# Patient Record
Sex: Female | Born: 1965 | Race: Black or African American | Hispanic: No | Marital: Married | Smoking: Never smoker
Health system: Southern US, Community
[De-identification: ages and names within clinical notes are randomized; demographics above are authoritative.]

## PROBLEM LIST (undated history)

## (undated) DIAGNOSIS — N12 Tubulo-interstitial nephritis, not specified as acute or chronic: Secondary | ICD-10-CM

## (undated) DIAGNOSIS — E119 Type 2 diabetes mellitus without complications: Secondary | ICD-10-CM

## (undated) DIAGNOSIS — H409 Unspecified glaucoma: Secondary | ICD-10-CM

## (undated) DIAGNOSIS — N261 Atrophy of kidney (terminal): Secondary | ICD-10-CM

## (undated) DIAGNOSIS — E042 Nontoxic multinodular goiter: Secondary | ICD-10-CM

## (undated) DIAGNOSIS — R87619 Unspecified abnormal cytological findings in specimens from cervix uteri: Secondary | ICD-10-CM

## (undated) DIAGNOSIS — IMO0002 Reserved for concepts with insufficient information to code with codable children: Secondary | ICD-10-CM

## (undated) DIAGNOSIS — E039 Hypothyroidism, unspecified: Secondary | ICD-10-CM

## (undated) DIAGNOSIS — N189 Chronic kidney disease, unspecified: Secondary | ICD-10-CM

## (undated) DIAGNOSIS — Z8619 Personal history of other infectious and parasitic diseases: Secondary | ICD-10-CM

## (undated) HISTORY — DX: Unspecified glaucoma: H40.9

## (undated) HISTORY — DX: Tubulo-interstitial nephritis, not specified as acute or chronic: N12

## (undated) HISTORY — DX: Hypothyroidism, unspecified: E03.9

## (undated) HISTORY — DX: Nontoxic multinodular goiter: E04.2

## (undated) HISTORY — DX: Reserved for concepts with insufficient information to code with codable children: IMO0002

## (undated) HISTORY — DX: Personal history of other infectious and parasitic diseases: Z86.19

## (undated) HISTORY — DX: Chronic kidney disease, unspecified: N18.9

## (undated) HISTORY — DX: Unspecified abnormal cytological findings in specimens from cervix uteri: R87.619

## (undated) HISTORY — DX: Type 2 diabetes mellitus without complications: E11.9

## (undated) HISTORY — DX: Atrophy of kidney (terminal): N26.1

---

## 1991-03-25 DIAGNOSIS — N12 Tubulo-interstitial nephritis, not specified as acute or chronic: Secondary | ICD-10-CM

## 1991-03-25 DIAGNOSIS — E039 Hypothyroidism, unspecified: Secondary | ICD-10-CM

## 1991-03-25 HISTORY — DX: Hypothyroidism, unspecified: E03.9

## 1991-03-25 HISTORY — DX: Tubulo-interstitial nephritis, not specified as acute or chronic: N12

## 2005-10-08 ENCOUNTER — Encounter: Admission: RE | Admit: 2005-10-08 | Discharge: 2005-10-08 | Payer: Self-pay | Admitting: Internal Medicine

## 2005-11-10 ENCOUNTER — Encounter: Admission: RE | Admit: 2005-11-10 | Discharge: 2005-11-10 | Payer: Self-pay | Admitting: Internal Medicine

## 2006-03-23 ENCOUNTER — Encounter: Admission: RE | Admit: 2006-03-23 | Discharge: 2006-03-23 | Payer: Self-pay | Admitting: Internal Medicine

## 2007-06-24 ENCOUNTER — Emergency Department (HOSPITAL_COMMUNITY): Admission: EM | Admit: 2007-06-24 | Discharge: 2007-06-25 | Payer: Self-pay | Admitting: Emergency Medicine

## 2010-04-14 ENCOUNTER — Encounter: Payer: Self-pay | Admitting: Internal Medicine

## 2010-08-06 NOTE — Consult Note (Signed)
Gail Barr, PRELL NO.:  [id number]   MEDICAL RECORD NO.:  [id number]          PATIENT TYPE:  EMS   LOCATION:  MAJO                         FACILITY:  [facility]   PHYSICIAN:  Coletta Memos, M.D.     DATE OF BIRTH:  04-23-1965   DATE OF CONSULTATION:  06/25/2007  DATE OF DISCHARGE:                                 CONSULTATION   TIME OF CONSULTATION:  0230 hours.   INDICATIONS:  Traumatic lumbar puncture.  Gail Barr is a 45 year old  woman who has had a history of headaches now for the past 3 months.  She  says she has not had any kind of history prior to that.  Over the last  few months, the headaches were severe.  She went to eye doctor and  received a new prescription, and said that her eyesight improved for  approximately a week.  Got worse again, and she started to have more  headaches.  Over the last 3 days she has had increasingly severe  headaches.  She says that they felt like the worst headaches that she  has had.  She felt somewhat faint and therefore called the husband, and  he brought her to the [facility] emergency room.  She arrived here 12:31  on June 24, 2007.  She had a subsequent workup.  She had a head CT which  was normal.  Secondary to a history of headaches, a lumbar puncture was  done.  According to the patient, she overheard the radiologist say, oh  I think I hit a vein, and he tried a number of times to perform the  lumbar puncture.  Then he finally remarked that he did not think he had  any blood.  I was contacted by phone and told that there were 8000 cells  in tube 1 and 8000 cells roughly in tube 4.  It was felt that this might  represent a subarachnoid hemorrhage.  I did not believe that it did, as  there were far too few cell for that to represent a subarachnoid  hemorrhage.  There was also absolutely no xanthochromia in the study.  Since this is not the first day that she has had a headache, it would be  extraordinarily unlikely for  her to have had a subarachnoid hemorrhage  without the xanthochromia, nor does the history fit for it.   PAST MEDICAL HISTORY:  Significant for hypothyroidism.  Gail Barr also  stated that, at some point, she saw, I believe, an ophthalmologist  though she did not say whether she saw an ophthalmologist or  optometrist, who felt at one time she may have glaucoma.  These  headaches that she has had have been described as being on the left side  of her face.  She has had blurred vision.  This has also been associated  with nausea and vomiting.  She has had no meningismus but states that  when she turns her head certain ways, she has had neck pain.  The right  side feels better.  She has had no problems with her vision on  the right  side.  She said the vision had been perfect up until the time she got  her eye glasses prescribed approximately a month ago, and since then,  eyesight has changed according to what she reported, drastically in the  left side.   CURRENT MEDICATIONS:  Synthroid 0.125 mg.   She has allergies to IODINE and she says to ADVIL.   She is married, lives at home and does have children.  She does not use  IV drugs.  She does not smoke.  She does not use alcohol.   FAMILY HISTORY:  Consistent with cancer and diabetes.   Her laboratory values were otherwise normal.  White blood cell count of  8.9, sodium and potassium 141 and 4.1.  Erythrocyte sedimentation rate  is 40.  She did have evidence of UTI with 21-50 white blood cells per  high-powered field and many bacteria on her study.  She did have a  pregnancy test which was negative, and the count on the tubes was 8825  red blood cells in tube 1 with 2 white blood cells, and 8155 in tube 4  with 4 white blood cells.  It was described as hazy and pink in both.  Supernatant was colorless in both.   No opening pressure was recorded.   EXAMINATION:  Blood pressure 109/75, pulse 67, temperature 97.  Oxygenation is 97% on  air.  Blood pressure at its highest was 133/84.  She is alert, oriented x4 and answering all questions appropriately.  Speech is clear and fluent.  Memory length, extent, and fund of  knowledge normal.  She has full visual fields.  Pupils equal round  reactive light.  Full extraocular movements.  Funduscopic exam shows  sharp optic disks.  Venous pulsations present bilaterally.  She has  symmetric facial movements and sensation.  Hearing intact to finger rub.  Uvula elevates in midline.  Tongue protrusion midline.  Shoulder shrug  is normal.  She has no drift on exam.  5/5 strength in the upper and  lower extremities.  Intact proprioception intact light touch.  Muscle  tone bulk and coordination are normal.  I did not have her walk, and she  is lying down on the stretcher in a dark room.   Given the patient's history of a 52-month history of headaches, long  history of headaches.  No obvious sentinel hemorrhage, nothing that  connects to a subarachnoid hemorrhage outside of her report that this  was a very severe headache today, also the results of the LP are not  impressive.  It is not at all clear, nor to me is it at all indicative  of, subarachnoid hemorrhage.  She has a negative CT, again no  xanthochromia, and these headaches have been going on for the last few  days.  So, she indeed had a sentinel hemorrhage and has subsequently had  another hemorrhage today, certainly xanthochromia should have been  present.  Xanthochromia was not present on her LP.  I think the patient  should be monitored.  I do think the patient should be assessed for  possible glaucoma.  The history of heavy headaches and blurred vision  just in 1 eye without there being a mass lesion present on the CT, again  is not suggestive of aneurysmal bleed.  She has reactive pupils.  The ER  physicians have heard my recommendations.  If they have more questions,  they can certainly contact me.  ______________________________  Coletta Memos, M.D.     KC/MEDQ  D:  06/25/2007  T:  06/25/2007  Job:  [id number]

## 2010-12-17 LAB — DIFFERENTIAL
Band Neutrophils: 0
Basophils Absolute: 0.1
Basophils Relative: 1
Blasts: 0
Eosinophils Absolute: 0.2
Eosinophils Relative: 2
Lymphocytes Relative: 23
Lymphs Abs: 2
Metamyelocytes Relative: 0
Monocytes Absolute: 0.6
Monocytes Relative: 7
Myelocytes: 0
Neutro Abs: 6
Neutrophils Relative %: 67
Promyelocytes Absolute: 0
nRBC: 0

## 2010-12-17 LAB — CSF CELL COUNT WITH DIFFERENTIAL
RBC Count, CSF: 8155 — ABNORMAL HIGH
RBC Count, CSF: 8825 — ABNORMAL HIGH
WBC, CSF: 2
WBC, CSF: 4

## 2010-12-17 LAB — URINALYSIS, ROUTINE W REFLEX MICROSCOPIC
Bilirubin Urine: NEGATIVE
Glucose, UA: NEGATIVE
Ketones, ur: NEGATIVE
Nitrite: NEGATIVE
Protein, ur: 30 — AB
Specific Gravity, Urine: 1.016
Urobilinogen, UA: 0.2
pH: 6

## 2010-12-17 LAB — CBC
HCT: 32.8 — ABNORMAL LOW
Hemoglobin: 10.7 — ABNORMAL LOW
MCHC: 32.7
MCV: 72.2 — ABNORMAL LOW
Platelets: 488 — ABNORMAL HIGH
RBC: 4.55
RDW: 20 — ABNORMAL HIGH
WBC: 8.9

## 2010-12-17 LAB — CSF CULTURE W GRAM STAIN

## 2010-12-17 LAB — URINE MICROSCOPIC-ADD ON

## 2010-12-17 LAB — POCT I-STAT, CHEM 8
Creatinine, Ser: 1.4 — ABNORMAL HIGH
Glucose, Bld: 114 — ABNORMAL HIGH
Hemoglobin: 11.9 — ABNORMAL LOW
Potassium: 4.1
TCO2: 27

## 2010-12-17 LAB — PREGNANCY, URINE: Preg Test, Ur: NEGATIVE

## 2010-12-17 LAB — SEDIMENTATION RATE: Sed Rate: 40 — ABNORMAL HIGH

## 2010-12-17 LAB — PROTEIN AND GLUCOSE, CSF: Total  Protein, CSF: 77 — ABNORMAL HIGH

## 2011-07-07 ENCOUNTER — Other Ambulatory Visit: Payer: Self-pay | Admitting: Family Medicine

## 2011-07-07 ENCOUNTER — Other Ambulatory Visit (HOSPITAL_COMMUNITY)
Admission: RE | Admit: 2011-07-07 | Discharge: 2011-07-07 | Disposition: A | Discharge status: Home / Self Care | Payer: 59 | Source: Ambulatory Visit | Attending: Family Medicine | Admitting: Family Medicine

## 2011-07-07 ENCOUNTER — Encounter: Payer: Self-pay | Admitting: Family Medicine

## 2011-07-07 ENCOUNTER — Ambulatory Visit (INDEPENDENT_AMBULATORY_CARE_PROVIDER_SITE_OTHER): Payer: 59 | Admitting: Family Medicine

## 2011-07-07 VITALS — BP 122/82 | HR 72 | Ht 68.0 in | Wt 289.0 lb

## 2011-07-07 DIAGNOSIS — Z0000 Encounter for general adult medical examination without abnormal findings: Secondary | ICD-10-CM

## 2011-07-07 DIAGNOSIS — R209 Unspecified disturbances of skin sensation: Secondary | ICD-10-CM

## 2011-07-07 DIAGNOSIS — R202 Paresthesia of skin: Secondary | ICD-10-CM

## 2011-07-07 DIAGNOSIS — R5381 Other malaise: Secondary | ICD-10-CM

## 2011-07-07 DIAGNOSIS — R319 Hematuria, unspecified: Secondary | ICD-10-CM

## 2011-07-07 DIAGNOSIS — Z01419 Encounter for gynecological examination (general) (routine) without abnormal findings: Secondary | ICD-10-CM | POA: Insufficient documentation

## 2011-07-07 DIAGNOSIS — R5383 Other fatigue: Secondary | ICD-10-CM

## 2011-07-07 DIAGNOSIS — E039 Hypothyroidism, unspecified: Secondary | ICD-10-CM | POA: Insufficient documentation

## 2011-07-07 LAB — POCT URINALYSIS DIPSTICK
Bilirubin, UA: NEGATIVE
Glucose, UA: NEGATIVE
Ketones, UA: NEGATIVE
Nitrite, UA: NEGATIVE
Protein, UA: NEGATIVE
Urobilinogen, UA: NEGATIVE

## 2011-07-07 NOTE — Patient Instructions (Addendum)
HEALTH MAINTENANCE RECOMMENDATIONS:  It is recommended that you get at least 30 minutes of aerobic exercise at least 5 days/week (for weight loss, you may need as much as 60-90 minutes). This can be any activity that gets your heart rate up. This can be divided in 10-15 minute intervals if needed, but try and build up your endurance at least once a week.  Weight bearing exercise is also recommended twice weekly.  Eat a healthy diet with lots of vegetables, fruits and fiber.  "Colorful" foods have a lot of vitamins (ie green vegetables, tomatoes, red peppers, etc).  Limit sweet tea, regular sodas and alcoholic beverages, all of which has a lot of calories and sugar.  Up to 1 alcoholic drink daily may be beneficial for women (unless trying to lose weight, watch sugars).  Drink a lot of water.  Calcium recommendations are 1200-1500 mg daily (1500 mg for postmenopausal women or women without ovaries), and vitamin D 1000 IU daily.  This should be obtained from diet and/or supplements (vitamins), and calcium should not be taken all at once, but in divided doses.  Monthly self breast exams and yearly mammograms for women over the age of 28 is recommended.  Sunscreen of at least SPF 30 should be used on all sun-exposed parts of the skin when outside between the hours of 10 am and 4 pm (not just when at beach or pool, but even with exercise, golf, tennis, and yard work!)  Use a sunscreen that says "broad spectrum" so it covers both UVA and UVB rays, and make sure to reapply every 1-2 hours.  Remember to change the batteries in your smoke detectors when changing your clock times in the spring and fall.  Use your seat belt every time you are in a car, and please drive safely and not be distracted with cell phones and texting while driving.  Encouraged either prenatal vitamins (for folic acid, in case of pregnancy) vs use of contraception, ie condoms, vs husband vasectomy vs IUD vs other.  Please schedule  mammogram (I gave your [facility])

## 2011-07-07 NOTE — Progress Notes (Signed)
Gail Barr is a 46 y.o. female who presents for a complete physical. She is a new patient, and no prior records are available.  She has the following concerns:   Hypothyroidism--she has been off her thyroid medication for 3 years.  Has gained some weight (although lost 5 pounds recently).  She has gained 80 pounds since moving to [place] in 2005.  She thinks she had more hair loss and memory problems while taking thyroid meds since being off of them. Does admit to feeling more fatigued.  Tired in the morning.  Husband says she doesn't snore. Gets some dry patches of skin.  Some constipation, but relieved with proper diet (ie prune juice).  Period are irregular--states they have been irregular ever since having her kids.    Health Maintenance: There is no immunization history on file for this patient. Recalls last tetanus was approx 2006. Doesn't get flu shots Last Pap smear: 3 years ago Last mammogram: never Last colonoscopy: never Last DEXA: never Exercise: 3 times/week, mostly stretching. Dentist: once yearly Ophtho: last week, wears glasses  She is not using any contraception.  Didn't tolerate OCP's (even progesterone-only ones) due to nausea.    Past Medical History  Diagnosis Date  . Hypothyroidism 1993  . 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 Multiple thyroid nodules   . Abnormal Pap smear early 2000's    had colposcopy, no additional treatment. normal paps since    History reviewed. No pertinent past surgical history.  History   Social History  . Marital Status: Married    Spouse Name: N/A    Number of Children: 3  . Years of Education: N/A   Occupational History  . 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 Smoking status: Never Smoker   . Smokeless tobacco: Never Used  . Alcohol Use: Yes     one drink every three months.  . Drug Use: No  . Sexually Active: Yes -- Female partner(s)      Birth Control/ Protection: None   Other Topics Concern  . Not on file   Social History Narrative   Lives with husband, and 13 yo daughter.  Son is a police office in [place], has 1 grandchild.  Daughter is a Theatre stage manager in [place].  She moved here in 2005 from [place]    Family History  Problem Relation Age of Onset  . Asthma Brother   . Diabetes Maternal Aunt   . Diabetes Maternal Uncle   . Diabetes Maternal Grandmother   . Cancer Maternal Grandfather     prostate cancer  . Asthma Brother     No current outpatient prescriptions on file.  Allergies  Allergen Reactions  . Iohexol      Desc: Pt states [place] 5 [place]rs [place] and experienced severe sob and swelling. We did her scan w/o iv contrast today.        Thanks., Onset Date: [id number]    ROS:  The patient denies anorexia, fever, vision changes, decreased hearing, ear pain, sore throat, chest pain, palpitations, dizziness, syncope, dyspnea on exertion, cough, swelling, nausea, vomiting, diarrhea, melena, hematochezia, indigestion/heartburn, hematuria, incontinence, vaginal discharge, odor or itch, genital lesions, joint pains,  weakness, tremor, suspicious skin lesions, depression, anxiety, abnormal bleeding/bruising, or enlarged lymph nodes. Weight changes--see HPI.  Headaches only if she doesn't wear glasses.  +irregular menstrual cycles.  Sometimes  gets some numbness in her toes, and her inner calf on the left.  Symptoms x 6 months, and sometimes gets on her L inner arm also. She occasionally has some slight discomfort after voiding.  Admits to holding her urine for a long time, and then sometimes it is painful when she voids. Had a small painful breast lump a few months ago, that completely resolved.    PHYSICAL EXAM: BP 122/82  Pulse 72  Ht 5\' 8"  (1.727 m)  Wt 289 lb (131.09 kg)  BMI 43.94 kg/m2  LMP 06/23/2011  General Appearance:    Alert, cooperative, no distress, appears stated age  Head:     Normocephalic, without obvious abnormality, atraumatic  Eyes:    PERRL, conjunctiva/corneas clear, EOM's intact, fundi    benign  Ears:    Normal TM's and external ear canals  Nose:   Nares normal, mucosa normal, no drainage or sinus   tenderness  Throat:   Lips, mucosa, and tongue normal; teeth and gums normal  Neck:   Supple, no lymphadenopathy;  thyroid: moderately enlarged, appears fairly symmetric with no dominant nodules.  no carotid   bruit or JVD  Back:    Spine nontender, no curvature, ROM normal, no CVA     tenderness  Lungs:     Clear to auscultation bilaterally without wheezes, rales or     ronchi; respirations unlabored  Chest Wall:    No deformity. Tenderness at lower sternum/xiphoid process.   Heart:    Regular rate and rhythm, S1 and S2 normal, no murmur, rub   or gallop  Breast Exam:    No tenderness, masses, or nipple discharge or inversion.      No axillary lymphadenopathy  Abdomen:     Soft, nondistended, normoactive bowel sounds,    no masses, no hepatosplenomegaly. No epigastric tenderness. Mild suprapubic tenderness  Genitalia:    Normal external genitalia without lesions.  BUS and vagina normal; cervix without lesions, or cervical motion tenderness. Red and brown blood in vaginal vault.  Uterus and adnexa not enlarged, nontender, no masses.  Pap performed  Rectal:    Normal tone, no masses or tenderness; guaiac negative stool  Extremities:   No clubbing, cyanosis or edema  Pulses:   2+ and symmetric all extremities  Skin:   Skin color, texture, turgor normal, no rashes or lesions  Lymph nodes:   Cervical, supraclavicular, and axillary nodes normal  Neurologic:   CNII-XII intact, normal strength, sensation and gait; reflexes 0-1+ and symmetric throughout          Psych:   Normal mood, affect, hygiene and grooming.       ASSESSMENT/PLAN: 1. Routine general medical examination at a health care facility  POCT Urinalysis Dipstick, Visual acuity screening, Lipid panel,  Cytology - PAP  2. Hematuria  Urine culture   likely related to ongoing menstrual bleeding, but some suprapubic tenderness.  Urine sent for culture.  3. Obesity, Class III, BMI 40-49.9 (morbid obesity)  Hemoglobin A1c  4. Paresthesias  Hemoglobin A1c  5. Other malaise and fatigue  CBC with Differential, Comprehensive metabolic panel, TSH, Vitamin D 25 hydroxy  6. Unspecified hypothyroidism  TSH   has been off of thyroid medication for 3 years.    Discussed monthly self breast exams and yearly mammograms after the age of 17; at least 30 minutes of aerobic activity at least 5 days/week; proper sunscreen use reviewed; healthy diet, including goals of calcium and vitamin D intake and alcohol recommendations (  less than or equal to 1 drink/day) reviewed; regular seatbelt use; changing batteries in smoke detectors.  Immunization recommendations discussed.  Colonoscopy recommendations reviewed--recommended at age 45  Encouraged either prenatal vitamins (for folic acid, in case of pregnancy) vs use of contraception, ie condoms, vs husband vasectomy vs IUD vs other.  Need to check previous records for date and type of tetanus given (if not given TdaP, then recommend booster)

## 2011-07-08 ENCOUNTER — Encounter: Payer: Self-pay | Admitting: Family Medicine

## 2011-07-08 DIAGNOSIS — E559 Vitamin D deficiency, unspecified: Secondary | ICD-10-CM | POA: Insufficient documentation

## 2011-07-08 LAB — CBC WITH DIFFERENTIAL/PLATELET
Eosinophils Absolute: 0.2 10*3/uL (ref 0.0–0.7)
Eosinophils Relative: 2 % (ref 0–5)
Lymphocytes Relative: 29 % (ref 12–46)
MCHC: 31 g/dL (ref 30.0–36.0)
Monocytes Relative: 5 % (ref 3–12)
Neutro Abs: 6.1 10*3/uL (ref 1.7–7.7)
Platelets: 507 10*3/uL — ABNORMAL HIGH (ref 150–400)
RDW: 19.5 % — ABNORMAL HIGH (ref 11.5–15.5)
WBC: 9.6 10*3/uL (ref 4.0–10.5)

## 2011-07-08 LAB — LIPID PANEL
LDL Cholesterol: 117 mg/dL — ABNORMAL HIGH (ref 0–99)
Total CHOL/HDL Ratio: 4.8 Ratio

## 2011-07-08 LAB — COMPREHENSIVE METABOLIC PANEL
AST: 13 U/L (ref 0–37)
Alkaline Phosphatase: 62 U/L (ref 39–117)
BUN: 15 mg/dL (ref 6–23)
Creat: 1.17 mg/dL — ABNORMAL HIGH (ref 0.50–1.10)
Glucose, Bld: 74 mg/dL (ref 70–99)

## 2011-07-08 LAB — VITAMIN D 25 HYDROXY (VIT D DEFICIENCY, FRACTURES): Vit D, 25-Hydroxy: 16 ng/mL — ABNORMAL LOW (ref 30–89)

## 2011-07-08 LAB — TSH: TSH: 49.634 u[IU]/mL — ABNORMAL HIGH (ref 0.350–4.500)

## 2011-07-09 ENCOUNTER — Other Ambulatory Visit: Payer: Self-pay | Admitting: *Deleted

## 2011-07-09 DIAGNOSIS — D649 Anemia, unspecified: Secondary | ICD-10-CM

## 2011-07-09 DIAGNOSIS — E039 Hypothyroidism, unspecified: Secondary | ICD-10-CM

## 2011-07-09 MED ORDER — LEVOTHYROXINE SODIUM 150 MCG PO TABS: 150.0000 ug | ORAL_TABLET | Freq: Every day | ORAL | Status: DC

## 2011-07-09 MED ORDER — ERGOCALCIFEROL 1.25 MG (50000 UT) PO CAPS: 50000.0000 [IU] | ORAL_CAPSULE | ORAL | Status: DC

## 2011-07-10 ENCOUNTER — Other Ambulatory Visit: Payer: Self-pay | Admitting: *Deleted

## 2011-07-10 LAB — URINE CULTURE: Colony Count: >=100000

## 2011-07-10 MED ORDER — NITROFURANTOIN MONOHYD MACRO 100 MG PO CAPS: 100.0000 mg | ORAL_CAPSULE | Freq: Two times a day (BID) | ORAL | Status: DC

## 2011-08-25 ENCOUNTER — Encounter: Payer: Self-pay | Admitting: Family Medicine

## 2011-08-25 DIAGNOSIS — D509 Iron deficiency anemia, unspecified: Secondary | ICD-10-CM | POA: Insufficient documentation

## 2013-01-06 ENCOUNTER — Telehealth: Payer: Self-pay | Admitting: *Deleted

## 2013-01-06 ENCOUNTER — Encounter (HOSPITAL_COMMUNITY): Payer: Self-pay | Admitting: Emergency Medicine

## 2013-01-06 ENCOUNTER — Emergency Department (HOSPITAL_COMMUNITY)
Admission: EM | Admit: 2013-01-06 | Discharge: 2013-01-06 | Disposition: A | Discharge status: Home / Self Care | Payer: 59 | Attending: Emergency Medicine | Admitting: Emergency Medicine

## 2013-01-06 ENCOUNTER — Emergency Department (HOSPITAL_COMMUNITY): Payer: 59

## 2013-01-06 DIAGNOSIS — R0789 Other chest pain: Secondary | ICD-10-CM | POA: Insufficient documentation

## 2013-01-06 DIAGNOSIS — Z862 Personal history of diseases of the blood and blood-forming organs and certain disorders involving the immune mechanism: Secondary | ICD-10-CM | POA: Insufficient documentation

## 2013-01-06 DIAGNOSIS — R42 Dizziness and giddiness: Secondary | ICD-10-CM | POA: Insufficient documentation

## 2013-01-06 DIAGNOSIS — R209 Unspecified disturbances of skin sensation: Secondary | ICD-10-CM | POA: Insufficient documentation

## 2013-01-06 DIAGNOSIS — Z8639 Personal history of other endocrine, nutritional and metabolic disease: Secondary | ICD-10-CM | POA: Insufficient documentation

## 2013-01-06 DIAGNOSIS — Z8739 Personal history of other diseases of the musculoskeletal system and connective tissue: Secondary | ICD-10-CM | POA: Insufficient documentation

## 2013-01-06 LAB — COMPREHENSIVE METABOLIC PANEL
Alkaline Phosphatase: 65 U/L (ref 39–117)
BUN: 9 mg/dL (ref 6–23)
Calcium: 8.9 mg/dL (ref 8.4–10.5)
Chloride: 103 mEq/L (ref 96–112)
Creatinine, Ser: 0.87 mg/dL (ref 0.50–1.10)
GFR calc Af Amer: >90 mL/min (ref >90)
Glucose, Bld: 135 mg/dL — ABNORMAL HIGH (ref 70–99)
Total Protein: 7.6 g/dL (ref 6.0–8.3)

## 2013-01-06 LAB — D-DIMER, QUANTITATIVE: D-Dimer, Quant: 0.27 ug/mL-FEU (ref 0.00–0.48)

## 2013-01-06 LAB — CBC
HCT: 26.4 % — ABNORMAL LOW (ref 36.0–46.0)
Hemoglobin: 8.5 g/dL — ABNORMAL LOW (ref 12.0–15.0)
MCH: 21.3 pg — ABNORMAL LOW (ref 26.0–34.0)
MCHC: 32.2 g/dL (ref 30.0–36.0)
MCV: 66.2 fL — ABNORMAL LOW (ref 78.0–100.0)
Platelets: 376 10*3/uL (ref 150–400)
RBC: 3.99 MIL/uL (ref 3.87–5.11)
RDW: 19.4 % — ABNORMAL HIGH (ref 11.5–15.5)
WBC: 9.8 10*3/uL (ref 4.0–10.5)

## 2013-01-06 LAB — POCT I-STAT TROPONIN I
Troponin i, poc: 0 ng/mL (ref 0.00–0.08)
Troponin i, poc: 0 ng/mL (ref 0.00–0.08)

## 2013-01-06 MED ORDER — GI COCKTAIL ~~LOC~~
30.0000 mL | Freq: Once | ORAL | Status: AC
  Administered 2013-01-06: 30 mL via ORAL
  Filled 2013-01-06: qty 30

## 2013-01-06 MED ORDER — ASPIRIN 81 MG PO CHEW
324.0000 mg | CHEWABLE_TABLET | Freq: Once | ORAL | Status: AC
  Administered 2013-01-06: 324 mg via ORAL
  Filled 2013-01-06: qty 4

## 2013-01-06 MED ORDER — MORPHINE SULFATE 4 MG/ML IJ SOLN
4.0000 mg | Freq: Once | INTRAMUSCULAR | Status: AC
  Administered 2013-01-06: 4 mg via INTRAVENOUS
  Filled 2013-01-06: qty 1

## 2013-01-06 MED ORDER — NITROGLYCERIN 0.4 MG SL SUBL
0.4000 mg | SUBLINGUAL_TABLET | SUBLINGUAL | Status: DC | PRN
  Administered 2013-01-06: 0.4 mg via SUBLINGUAL

## 2013-01-06 NOTE — ED Provider Notes (Signed)
TIME SEEN: 3:04 AM  CHIEF COMPLAINT: Chest pain  HPI: Patient is a 47 year old female with a history of hypothyroidism who presents the emergency department with a burning like chest pain in the left side of her chest that started at 10 PM yesterday. She states she's also had numbness along her left cheek and left leg and in her left inner, upper arm and left fingertips. She states she's also had lightheadedness. She states that she feels like she has to take big deep breaths to be what to catch her breath. Denies any nausea, vomiting. No diaphoresis. She's never had similar symptoms. She describes the burning as moderate in nature. No radiation. No prior history of cardiac disease, stress test or cardiac catheterization. No prior history of PE or DVT. No recent prolonged immobilization, hospitalization, surgery, fracture, trauma, tobacco use or exogenous hormone use. Denies a history of hypertension, diabetes, hyperlipidemia. No family history of CAD.  ROS: See HPI Constitutional: no fever  Eyes: no drainage  ENT: no runny nose   Cardiovascular:   chest pain  Resp: no SOB  GI: no vomiting GU: no dysuria Integumentary: no rash  Allergy: no hives  Musculoskeletal: no leg swelling  Neurological: no slurred speech ROS otherwise negative  PAST MEDICAL HISTORY/PAST SURGICAL HISTORY:  Past Medical History  Diagnosis Date  . Hypothyroidism 1993  . 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 Multiple thyroid nodules   . Abnormal Pap smear early 2000's    had colposcopy, no additional treatment. normal paps since    MEDICATIONS:  Prior to Admission medications   Not on File    ALLERGIES:  Allergies  Allergen Reactions  . Iohexol      Desc: Pt states she [place] a [place] approx 5 yrs ago and experienced severe sob and swelling. We did her scan w/o iv contrast today.        Thanks., Onset Date: [id number]      SOCIAL HISTORY:  History  Substance Use Topics  . Smoking status: Never Smoker   . Smokeless tobacco: Never Used  . Alcohol Use: Yes     Comment: one drink every three months.    FAMILY HISTORY: Family History  Problem Relation Age of Onset  . Asthma Brother   . Diabetes Maternal Aunt   . Diabetes Maternal Uncle   . Diabetes Maternal Grandmother   . Cancer Maternal Grandfather     prostate cancer  . Asthma Brother     EXAM: BP 135/72  Pulse 92  Temp(Src) 98.5 F (36.9 C) (Oral)  Resp 15  SpO2 100%  LMP 01/06/2013 CONSTITUTIONAL: Alert and oriented and responds appropriately to questions. Well-appearing; well-nourished HEAD: Normocephalic EYES: Conjunctivae clear, PERRL ENT: normal nose; no rhinorrhea; moist mucous membranes; pharynx without lesions noted NECK: Supple, no meningismus, no LAD  CARD: RRR; S1 and S2 appreciated; no murmurs, no clicks, no rubs, no gallops RESP: Normal chest excursion without splinting or tachypnea; breath sounds clear and equal bilaterally; no wheezes, no rhonchi, no rales,  ABD/GI: Normal bowel sounds; non-distended; soft, non-tender, no rebound, no guarding BACK:  The back appears normal and is non-tender to palpation, there is no CVA tenderness EXT: Normal ROM in all joints; non-tender to palpation; no edema; normal capillary refill; no cyanosis    SKIN: Normal color for age and race; warm NEURO: Moves all extremities equally PSYCH: The patient's mood and manner are appropriate. Grooming  and personal hygiene are appropriate.  MEDICAL DECISION MAKING: Patient with atypical chest pain with minimal risk factors for ACS. Her heart score is 1 based on a slightly abnormal EKG. PERC negative.  Labs including troponin negative other than a slightly decreased hemoglobin at 8.5.  Patient reports heavy menstrual bleeding for the past 3 months. She has outpatient OB/GYN followup for this.  As for pt's chest pain, will obtain chest x-ray and give GI  cocktail and reassess. Patient is hemodynamically stable. I do not feel she needs admission at this time.   Date: 01/06/2013 2:41 AM  Rate: 88  Rhythm: normal sinus rhythm  QRS Axis: normal  Intervals: Right bundle branch block  ST/T Wave abnormalities: normal  Conduction Disutrbances: none  Narrative Interpretation: Right bundle branch block, no old for comparison     ED PROGRESS: No relief with GI cocktail. Patient is now mildly tachycardic, crying. She states her pain is now a tightness, sharp and burning. Will give aspirin, nitroglycerin and repeat EKG. She is otherwise hemodynamically stable.    Date: 01/06/2013 5:27 AM  Rate: 91  Rhythm: normal sinus rhythm  QRS Axis: normal  Intervals: Right bundle branch block  ST/T Wave abnormalities: normal  Conduction Disutrbances: none  Narrative Interpretation: Right bundle branch block, no new ST changes    5:59 AM  Pt reports some very mild relief with nitroglycerin. Will give morphine and reassess. D-dimer negative. Repeat troponin that is greater than 6 hours after the onset of symptoms pending.  6:22 AM  Second troponin is negative. Had lengthy discussion with patient and husband at bedside who agree that they would like outpatient followup. Given strict return precautions. Instructed him to followup with her primary care physician today as they may recommend an outpatient stress test.  Layla Maw Syrita Dovel, DO 01/06/13 [phone]

## 2013-01-06 NOTE — Telephone Encounter (Signed)
Called patient and left message asking her to return my call. Trying to get her scheduled for this afternoon for ER follow up.

## 2013-01-06 NOTE — ED Provider Notes (Signed)
Patient's facial and arm numbness does not follow any dermatome, peripheral nerve and is not consistent with any intracranial abnormality. She is neurologically intact on exam. Sensation to light touch intact diffusely, cranial nerves II through XII intact, strength 5/5 in all 4 extremities, normal gait. I do not feel she needs a brain imaging at this time. No headache. No head injury. No anticoagulation.  Layla Maw Ward, DO 01/06/13 [phone]

## 2013-01-06 NOTE — ED Notes (Signed)
Pt states she was leaving work last night around 2200 when she first noticed her chest pain, pt states her chest pain is in her left chest. Pt describes her chest feels like its burning, pt denies cardiac hx, pt states she has numbness and tingling on the left side of her face, pt states she has numbness and tingling that also radiates down her arm. Pt denies N/V, however pt states she she has dizziness that is coming and going. Pt is A&O X4. Pt states she is SOB.

## 2013-01-10 ENCOUNTER — Ambulatory Visit (INDEPENDENT_AMBULATORY_CARE_PROVIDER_SITE_OTHER): Payer: 59 | Admitting: Family Medicine

## 2013-01-10 ENCOUNTER — Encounter: Payer: Self-pay | Admitting: Family Medicine

## 2013-01-10 VITALS — BP 140/84 | HR 64 | Ht 68.0 in | Wt 270.0 lb

## 2013-01-10 DIAGNOSIS — R5381 Other malaise: Secondary | ICD-10-CM

## 2013-01-10 DIAGNOSIS — E039 Hypothyroidism, unspecified: Secondary | ICD-10-CM

## 2013-01-10 DIAGNOSIS — R202 Paresthesia of skin: Secondary | ICD-10-CM

## 2013-01-10 DIAGNOSIS — M940 Chondrocostal junction syndrome [Tietze]: Secondary | ICD-10-CM

## 2013-01-10 DIAGNOSIS — D509 Iron deficiency anemia, unspecified: Secondary | ICD-10-CM

## 2013-01-10 DIAGNOSIS — N390 Urinary tract infection, site not specified: Secondary | ICD-10-CM

## 2013-01-10 DIAGNOSIS — N926 Irregular menstruation, unspecified: Secondary | ICD-10-CM

## 2013-01-10 DIAGNOSIS — R209 Unspecified disturbances of skin sensation: Secondary | ICD-10-CM

## 2013-01-10 DIAGNOSIS — Z6841 Body Mass Index (BMI) 40.0 and over, adult: Secondary | ICD-10-CM

## 2013-01-10 DIAGNOSIS — E559 Vitamin D deficiency, unspecified: Secondary | ICD-10-CM

## 2013-01-10 DIAGNOSIS — E66813 Obesity, class 3: Secondary | ICD-10-CM

## 2013-01-10 LAB — POCT URINALYSIS DIPSTICK
Nitrite, UA: NEGATIVE
Urobilinogen, UA: NEGATIVE
pH, UA: 5

## 2013-01-10 LAB — HEMOGLOBIN A1C
Hgb A1c MFr Bld: 6.5 % — ABNORMAL HIGH (ref <5.7)
Mean Plasma Glucose: 140 mg/dL — ABNORMAL HIGH (ref <117)

## 2013-01-10 LAB — CBC WITH DIFFERENTIAL/PLATELET
Basophils Relative: 1 % (ref 0–1)
HCT: 27.4 % — ABNORMAL LOW (ref 36.0–46.0)
Hemoglobin: 8.9 g/dL — ABNORMAL LOW (ref 12.0–15.0)
Lymphocytes Relative: 30 % (ref 12–46)
MCHC: 32.5 g/dL (ref 30.0–36.0)
Monocytes Absolute: 0.4 10*3/uL (ref 0.1–1.0)
Monocytes Relative: 6 % (ref 3–12)
Neutro Abs: 4.5 10*3/uL (ref 1.7–7.7)
Platelets: 485 10*3/uL — ABNORMAL HIGH (ref 150–400)

## 2013-01-10 MED ORDER — SYNTHROID 175 MCG PO TABS: 175.0000 ug | ORAL_TABLET | Freq: Every day | ORAL | Status: DC

## 2013-01-10 MED ORDER — MEDROXYPROGESTERONE ACETATE 10 MG PO TABS: 10.0000 mg | ORAL_TABLET | Freq: Every day | ORAL | Status: DC

## 2013-01-10 MED ORDER — NAPROXEN 500 MG PO TABS: 500.0000 mg | ORAL_TABLET | Freq: Two times a day (BID) | ORAL | Status: DC

## 2013-01-10 NOTE — Patient Instructions (Addendum)
Anemia--likely related to ongoing bleeding.  Trial of provera 10mg  once daily x 10 days.  Expect bleeding should lighten or stop while on the medication, but bleeding to occur again after stopping the medication.  F/u with GYn as scheduled.  Thyroid likely contributing to abnormal menses. Increase iron to 325 mg three times daily with food.  Take with orange juice or vitamin C  Restart thyroid medication--take daily on an empty stomach, with no other medication or vitamins  Costochondritis--inflammation of chest wall --this is why your left chest hurts.  This is treated with anti-inflammatories.  Take the naproxen twice daily with food for up to 14 days.  You can stop it after a week if your pain has completely resolved.   F/u 1 week if needing FMLA forms filled out, and brings form with you.  I do not think that you will need to be out for very long.  Costochondritis Costochondritis (Tietze syndrome), or costochondral separation, is a swelling and irritation (inflammation) of the tissue (cartilage) that connects your ribs with your breastbone (sternum). It may occur on its own (spontaneously), through damage caused by an accident (trauma), or simply from coughing or minor exercise. It may take up to 6 weeks to get better and longer if you are unable to be conservative in your activities. HOME CARE INSTRUCTIONS   Avoid exhausting physical activity. Try not to strain your ribs during normal activity. This would include any activities using chest, belly (abdominal), and side muscles, especially if heavy weights are used.  Use ice for 15-20 minutes per hour while awake for the first 2 days. Place the ice in a plastic bag, and place a towel between the bag of ice and your skin.  Only take over-the-counter or prescription medicines for pain, discomfort, or fever as directed by your caregiver. SEEK IMMEDIATE MEDICAL CARE IF:   Your pain increases or you are very uncomfortable.  You have a  fever.  You develop difficulty with your breathing.  You cough up blood.  You develop worse chest pains, shortness of breath, sweating, or vomiting.  You develop new, unexplained problems (symptoms). MAKE SURE YOU:   Understand these instructions.  Will watch your condition.  Will get help right away if you are not doing well or get worse. Document Released: 12/18/2004 Document Revised: 06/02/2011 Document Reviewed: 10/27/2007 [facility] Patient Information 2014 [place], [place].

## 2013-01-10 NOTE — Progress Notes (Signed)
Chief Complaint  Patient presents with  . hospital follow-up    hospital follow-up. still having having symptoms, numbness,bleeding, pain in chest., tired and weakness and dizziness in left side   She went to ER 10/16 with complaint of burning like chest pain in the left side of her chest that started at 10 PM 10/15. She states she's also had numbness along her left cheek and left leg and in her left inner, upper arm and left fingertips. She states she's also had lightheadedness. She states that she feels like she has to take big deep breaths to be what to catch her breath. Symptoms started while she was typing at work, with the numbness in her hands, She denies "freaking out" or hyperventilating.  She went home to relax, took a shower, but didn't get better so went to the ER.  She reports that she has felt very weak and tired, and drained since the ER visit.  She got a cold since then also.  She felt a little better the day of the visit, but once the morphine wore off, her symptoms recurred.  She has persistent numbness in the left upper chest and arm, with discomfort in her Left chest. The numbness in her face has resolved. She is describing the pain as a burning sensation underneath her left breast.  Hurts for her to sleep on her left side.  ER notes, labs, imaging and all studies reviewed.  CXR, EKG and labs done.  No relief from GI cocktail.  2 sets of Troponins were normal.  Slight improvement with nitroglycerin and morphine.  Recommended outpatient stress test.  She was found to be anemic, with h/o heavy periods.  She has appt scheduled in November with GYN.  She has been bleeding daily since 6/17.  Passing large clots. She has been taking iron supplement once daily since last visit here (1.5 years ago).    Hypothyroidism--she was started on medication at her first/last visit here in 06/2011.  She recalls coming back for a second thyroid test, and that dose was increased from 150 to 175.  She never  followed up after that.  (computer only shows one visit, with future orders still in system for CBC and TSH, no documentation of results, or of phone call).  She has been off of thyroid medication for over a year--didn't take it because she didn't think it was helping.  She is stating she doesn't think she can go back to work feeling like this--tired, and with the pain.  At end of visit she mentioned 3 days of odor to urine with mild dysuria.  No flank pain, fevers, chills, nausea or vomiting.  Past Medical History  Diagnosis Date  . Hypothyroidism 1993  . 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 Multiple thyroid nodules   . Abnormal Pap smear early 2000's    had colposcopy, no additional treatment. normal paps since   No past surgical history on file.  History   Social History  . Marital Status: Married    Spouse Name: N/A    Number of Children: 3  . Years of Education: N/A   Occupational History  . 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 Smoking status: Never Smoker   . Smokeless tobacco: Never Used  . Alcohol Use: Yes     Comment: one drink every three months.  . Drug Use: No  .  Sexual Activity: Yes    Partners: Male    Birth Control/ Protection: None   Other Topics Concern  . Not on file   Social History Narrative   Lives with husband, and 79 yo daughter.  Son is a police office in [place], has 1 grandchild.  Daughter is a Theatre stage manager in [place].  She moved here in 2005 from [place]   Current outpatient prescriptions:medroxyPROGESTERone (PROVERA) 10 MG tablet, Take 1 tablet (10 mg total) by mouth daily., Disp: 10 tablet, Rfl: 0;  naproxen (NAPROSYN) 500 MG tablet, Take 1 tablet (500 mg total) by mouth 2 (two) times daily with a meal., Disp: 30 tablet, Rfl: 0;  SYNTHROID 175 MCG tablet, Take 1 tablet (175 mcg total) by mouth daily before breakfast., Disp: 30 tablet, Rfl: 1  Allergies   Allergen Reactions  . Iohexol      Desc: Pt states [place] and experienced severe sob and swelling. We did her scan w/o iv contrast today.        Thanks., Onset Date: [id number]    ROS:  Denies fevers.  +cough and laryngitis.  Denies any runny nose or sinus pain. +dysuria as per HPI.  Denies exertional chest pain, shortness of breath, sore throat, GI complaints, bleeding/bruising. +stress at work.  +fatigue  PHYSICAL EXAM: BP 140/84  Pulse 64  Ht 5\' 8"  (1.727 m)  Wt 270 lb (122.471 kg)  BMI 41.06 kg/m2  LMP 01/06/2013 Pleasant, obese female in no acute distres Neck: no lymphadenopathy.  +goiter.  No nodules palpable Heart: regular rate and rhythm without murmur Lungs: clear bilaterally Abdomen: +mild Suprapubic tenderness. No organomegaly or mass. Back: No CVA tenderness Chest wall: Tender over left costochondral junctions at level of breast x 2 levels  ASSESSMENT/PLAN:  Iron deficiency anemia - heavy, ongoing menses.  increase iron to TID with meals, Vit C.  f/u with GYN as planne - Plan: CBC with Differential, Ferritin, Iron  Paresthesia - LUE--possibly related to thyroid.  if not improving, consider other etiologies (CTS, cervical radiculopathy) - Plan: Comprehensive metabolic panel, Hemoglobin A1c  Unspecified hypothyroidism - off meds for over a year.  restart and check labs today.  discussed impotance of following up for recheck on thyroid. discussed how to take med - Plan: TSH, SYNTHROID 175 MCG tablet  Unspecified vitamin D deficiency - low in past, and not currently taking any supplements - Plan: Vit D  25 hydroxy (rtn osteoporosis monitoring)  Morbid obesity with BMI of 40.0-44.9, adult - Plan: Hemoglobin A1c  Other malaise and fatigue - Plan: Comprehensive metabolic panel, CBC with Differential, TSH, Vit D  25 hydroxy (rtn osteoporosis monitoring)  Abnormal menses - likely combination of thyroid, and perimenopausal.  treat with Provera  x 10days.  f/u with GYN as scheduled.  Get thyroid controlled - Plan: medroxyPROGESTERone (PROVERA) 10 MG tablet  Costochondritis - L chest wall.  NSAIDs, warm compresses.  since chest pain is reproducible, hold off on stress test referral for now - Plan: naproxen (NAPROSYN) 500 MG tablet  Urinary tract infection, site not specified - abnormal u/a, but contributed by menses.  send for culture and treat as indicated - Plan: Urinalysis Dipstick, Urine culture  Obesity, Class III, BMI 40-49.9 (morbid obesity)  Anemia--likely related to ongoing bleeding.  Trial of provera 10mg  x 10 day.  Expect bleeding after stopping the medication.  F/u with GYn as scheduled.  Thyroid likely contributing to abnormal menses. Increase iron to  325 mg three times daily with food.  Take with orange juice or vitamin C  Return with FMLA forms for f/u visit if needed F/u 1 week on costochondritis/chest pain, fatigue (and enter future orders for thyroid testing and anemia at that time)

## 2013-01-11 LAB — IRON: Iron: 22 ug/dL — ABNORMAL LOW (ref 42–145)

## 2013-01-11 LAB — COMPREHENSIVE METABOLIC PANEL
ALT: <8 U/L (ref 0–35)
AST: 11 U/L (ref 0–37)
BUN: 7 mg/dL (ref 6–23)
Calcium: 9.5 mg/dL (ref 8.4–10.5)
Chloride: 103 mEq/L (ref 96–112)
Creat: 0.98 mg/dL (ref 0.50–1.10)
Sodium: 139 mEq/L (ref 135–145)

## 2013-01-11 LAB — VITAMIN D 25 HYDROXY (VIT D DEFICIENCY, FRACTURES): Vit D, 25-Hydroxy: 13 ng/mL — ABNORMAL LOW (ref 30–89)

## 2013-01-11 LAB — FERRITIN: Ferritin: 5 ng/mL — ABNORMAL LOW (ref 10–291)

## 2013-01-12 ENCOUNTER — Other Ambulatory Visit: Payer: Self-pay | Admitting: *Deleted

## 2013-01-12 DIAGNOSIS — E039 Hypothyroidism, unspecified: Secondary | ICD-10-CM

## 2013-01-12 DIAGNOSIS — D509 Iron deficiency anemia, unspecified: Secondary | ICD-10-CM

## 2013-01-12 MED ORDER — CIPROFLOXACIN HCL 500 MG PO TABS: 500.0000 mg | ORAL_TABLET | Freq: Two times a day (BID) | ORAL | Status: DC

## 2013-01-12 MED ORDER — TRAMADOL HCL 50 MG PO TABS: 50.0000 mg | ORAL_TABLET | Freq: Four times a day (QID) | ORAL | Status: DC | PRN

## 2013-01-12 MED ORDER — ERGOCALCIFEROL 1.25 MG (50000 UT) PO CAPS: 50000.0000 [IU] | ORAL_CAPSULE | ORAL | Status: DC

## 2013-01-17 ENCOUNTER — Ambulatory Visit (INDEPENDENT_AMBULATORY_CARE_PROVIDER_SITE_OTHER): Payer: 59 | Admitting: Family Medicine

## 2013-01-17 ENCOUNTER — Encounter: Payer: Self-pay | Admitting: Family Medicine

## 2013-01-17 VITALS — BP 132/82 | HR 60 | Ht 68.0 in | Wt 269.0 lb

## 2013-01-17 DIAGNOSIS — R079 Chest pain, unspecified: Secondary | ICD-10-CM

## 2013-01-17 DIAGNOSIS — D509 Iron deficiency anemia, unspecified: Secondary | ICD-10-CM

## 2013-01-17 DIAGNOSIS — M62838 Other muscle spasm: Secondary | ICD-10-CM

## 2013-01-17 DIAGNOSIS — M940 Chondrocostal junction syndrome [Tietze]: Secondary | ICD-10-CM

## 2013-01-17 DIAGNOSIS — E559 Vitamin D deficiency, unspecified: Secondary | ICD-10-CM

## 2013-01-17 DIAGNOSIS — M5412 Radiculopathy, cervical region: Secondary | ICD-10-CM

## 2013-01-17 DIAGNOSIS — E039 Hypothyroidism, unspecified: Secondary | ICD-10-CM

## 2013-01-17 MED ORDER — METHOCARBAMOL 500 MG PO TABS: 500.0000 mg | ORAL_TABLET | Freq: Three times a day (TID) | ORAL | Status: DC | PRN

## 2013-01-17 NOTE — Patient Instructions (Addendum)
Continue the naproxen twice daily. Use the tramadol only if needed for pain. Start using the robaxin up to three times daily for muscle spasm (your pain in the left side of your neck/shoulder) Continue heat, massage.  We will refer you for MRI of neck and thoracic spine (if able to get approved by your insurance)

## 2013-01-17 NOTE — Progress Notes (Signed)
Chief Complaint  Patient presents with  . Follow-up    1 week follow up. Brought in [facility] paperwork.    Labs from last week showed iron deficiency anemia. She was advised to take the iron TID as discussed at visit. She restarted thyroid medication.  Her sugar was normal at 83, but her A1c is 6.5, which suggests diabetes.  Her Vitamin D was also low.  She started rx 50,000 IU once weekly  UTI and prolonged vaginal bleeding--still taking ABX for UTI.  Having some vaginal irritation, but no itching or discharge.  Back pain resolved.  Never had urinary frequency or dysuria.  Urine odor has resolved (smells like medicine now).  Has some lower pelvic pain if she sits or stands for too long.  Her vaginal bleeding got significantly lighter since taking the Provera--no further clots.  Bleeding is very light now.  She increased her iron to TID and is having some constipation.  She is taking the naproxen twice daily as directed.  She finds that the chest isn't as tender, doesn't hurt as much when touching the chest wall.  She has slight decrease in appetite, but no abdominal pain from the NSAID.  She continues to complain of pressure in her chest, and numbness in her left arm, down to her hand.  Pressure is worse when she is lying flat, also worse if sitting too long, and is associated with her arm feeling heavy/numb.  When she stretches on her exercise ball (leaning backwards on the ball) it makes it worse, "stifling".  Left hand numbness and tingling in her arm is very concerning to her.  This is associated with the pressure in her chest. She does also have pain in her left neck by her left ear. She feels like the left arm is weak, using her right hand for everything.  Feels weak when heavy. Last week her right leg fell asleep for a few hours, while relaxing with her feet up. Had an episode yesterday where she felt her pulse in her head, felt dizzy, somewhat like spinning, resolved after going to sleep.  She  has been taking the tramadol three times daily, yesterday just once (didn't have a lot of pain, more the numbness and dizziness).  Denies feeling panicky or anxious.  She became tearful though, stating that she feels like there "is something really wrong.  She hasn't been back to work since her pain started on 10/15. Her job includes typing and talking on the phone. She works in [employer], but can also work from home.  She finds it is a stressful job, but reports that she loves her job.  She reports that she is unable to work due to numbness/tingling in the left hand, dizziness. She tried working from home, but couldn't.  Also got dizziness while trying to work from home.  She is having some blurred vision in L eye, and has ophtho scheduled.  Past Medical History  Diagnosis Date  . Hypothyroidism 1993  . 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 Multiple thyroid nodules   . Abnormal Pap smear early 2000's    had colposcopy, no additional treatment. normal paps since   History reviewed. No pertinent past surgical history.  History   Social History  . Marital Status: Married    Spouse Name: N/A    Number of Children: 3  . Years of Education: N/A   Occupational History  .  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 Smoking status: Never Smoker   . Smokeless tobacco: Never Used  . Alcohol Use: Yes     Comment: one drink every three months.  . Drug Use: No  . Sexual Activity: Yes    Partners: Male    Birth Control/ Protection: None   Other Topics Concern  . Not on file   Social History Narrative   Lives with husband, and 27 yo daughter.  Son is a police office in [place], has 1 grandchild.  Daughter is a Theatre stage manager in [place].  She moved here in 2005 from [place]    Current outpatient prescriptions:ciprofloxacin (CIPRO) 500 MG tablet, Take 1 tablet (500 mg total) by mouth 2 (two) times  daily., Disp: 14 tablet, Rfl: 0;  ergocalciferol (VITAMIN D2) 50000 UNITS capsule, Take 1 capsule (50,000 Units total) by mouth once a week., Disp: 12 capsule, Rfl: 0;  medroxyPROGESTERone (PROVERA) 10 MG tablet, Take 1 tablet (10 mg total) by mouth daily., Disp: 10 tablet, Rfl: 0 naproxen (NAPROSYN) 500 MG tablet, Take 1 tablet (500 mg total) by mouth 2 (two) times daily with a meal., Disp: 30 tablet, Rfl: 0;  SYNTHROID 175 MCG tablet, Take 1 tablet (175 mcg total) by mouth daily before breakfast., Disp: 30 tablet, Rfl: 1;  traMADol (ULTRAM) 50 MG tablet, Take 1 tablet (50 mg total) by mouth every 6 (six) hours as needed for pain., Disp: 20 tablet, Rfl: 0  Allergies  Allergen Reactions  . Iohexol      Desc: Pt states she [place] state approx 5 yrs ago and experienced severe sob and swelling. We did her scan w/o iv contrast today.        Thanks., Onset Date: [id number]    ROS:  Denies fevers, chills, URI complaints/cough.  No exertional chest pain.  +chest pressure that is positional, worse lying down.  No nausea, vomiting, diarrhea.  +L sided neck pain, arm heaviness/numbness/tingling.  +abnormal vaginal bleeding.  Urinary symptoms resolved.  +fatigue.  Denies depression/anxiety. See HPI  PHYSICAL EXAM: BP 132/82  Pulse 60  Ht 5\' 8"  (1.727 m)  Wt 269 lb (122.018 kg)  BMI 40.91 kg/m2  LMP 01/06/2013 Pleasant female in no distress.  During course of discussion, she became tearful, "I know that something is wrong" HEENT:  PERRL, conjunctiva clear.  OP clear Neck:no lymphadenopathy.  +thyromegaly.  No bruit.  No spine tenderness.  + Tender at Left trapezius muscle.  Back: no CVA or spinal tenderness Chest: Tender at L clavicle, as well as L costochondral junctions Heart: regular rate and rhythm Lungs: clear bilaterally Abdomen: very mildly tender in epigastrium, no organomegaly or mass Psych: intermittently anxious, upset, but full range of affect. Normal hygiene, grooming,  speech Neuro: alert and oriented.  Cranial nerves intact.  Normal strength, sensation. Extremities: no edema, 2+ pulses. Negative tinel,  Negative Phalen (just had pain in left shoulder holding that position).   Lab Results  Component Value Date   TSH 31.430* 01/10/2013     Chemistry      Component Value Date/Time   NA 139 01/10/2013 1412   K 4.7 01/10/2013 1412   CL 103 01/10/2013 1412   CO2 26 01/10/2013 1412   BUN 7 01/10/2013 1412   CREATININE 0.98 01/10/2013 1412   CREATININE 0.87 01/06/2013 0306      Component Value Date/Time   CALCIUM 9.5 01/10/2013 1412   ALKPHOS 62 01/10/2013 1412  AST 11 01/10/2013 1412   ALT <8 01/10/2013 1412   BILITOT 0.3 01/10/2013 1412     Lab Results  Component Value Date   WBC 7.4 01/10/2013   HGB 8.9* 01/10/2013   HCT 27.4* 01/10/2013   MCV 65.1* 01/10/2013   PLT 485* 01/10/2013   Lab Results  Component Value Date   FERRITIN 5* 01/10/2013   Vitamin D-OH--13 Glucose 83  Lab Results  Component Value Date   HGBA1C 6.5* 01/10/2013   Urine culture: + for Klebsiella UTI  ASSESSMENT/PLAN:  Unspecified vitamin D deficiency - continue weekly rx.  once completed, needs to take daily OTC supplementation longterm  Iron deficiency anemia - continue iron TID.  complete 10 day course of provera and f/u with GYN as scheduled  Costochondritis - mild improvement.  complete NSAID course.  consider change to steroid  Unspecified hypothyroidism - just restarted meds; recheck TSH in 6 weeks  Muscle spasms of neck - suspect cervical radiculopathy.  continue NSAID, start muscle relaxant.  heat, stretches, massage.   - Plan: methocarbamol (ROBAXIN) 500 MG tablet  Chest pain - still has component of costochondritis. has positional component to the "heavy" sensation.  ?poss of thoracic radiculopathy? had normal CXR - Plan: MR Thoracic Spine Wo Contrast  Cervical radiculopathy - continue NSAID, start muscle relaxant.  heat, stretches, massage.  check MRI.  May need trial of steroids and/or PT - Plan: MR Cervical Spine Wo Contrast  Possible diabetes/borderline--counseled re: diet--she doesn't drink sugary drinks, but +carbs.  Reviewed need for regular exercise, weight loss, cutting back on carbs, sweets, sugar.  Plan to recheck A1c in 3 months.  Vaginal bleeding--improving.  Expect it to stop during use of provera, and then have withdrawal bleed. Has appt 11/4 with GYN. Hypothyroidism may contribute  Constipation--consider starting stool softeners.  May be related to thyroid, but more likely related to starting iron.  Costochondritis and possible cervical radiculopathy--Prefers to wait another week and finish out the naproxen course, rather than changing to steroids at this point.  Recommend course of steroids if ongoing numbness/tingling in the left arm, associated with left neck and chest pain.  Vitamin D--to take rx 50,000 IU weekly x 12 weeks. She was advised that once she completes this rx, it is very important that she takes 1000 IU of Vitamin D OTC every day, in order to maintain the levels (or else she will be low again, just as occurred this time).  Return for labs early December (TSH, CBC, ferritin) Will also need 3 month f/u (to recheck A1c at OV).  Reviewed risks and side effects of meds. FMLA forms filled out  45 minute visit, more than 1/2 spent counseling.  F/u 7-10 days (after MRI and course of naprosyn)

## 2013-01-19 ENCOUNTER — Telehealth: Payer: Self-pay | Admitting: *Deleted

## 2013-01-19 NOTE — Telephone Encounter (Signed)
Patient called and left vm yesterday asking of you would put on her FMLA paperwork that she is out of work until Jan 28, 2013. She feels that this will give her enough time for the medicine to work. She knows that she has a follow up with you on 01/26/13. (I have her paperwork at my desk). Thanks.

## 2013-01-19 NOTE — Telephone Encounter (Signed)
I hadn't put an end date (didn't really require it)--figured I would wait and see MRI results, and her response before knowing enough to be more specific.  I left forms vague.  Let me know if there is something in particular on form I need to change.

## 2013-01-25 ENCOUNTER — Ambulatory Visit
Admission: RE | Admit: 2013-01-25 | Discharge: 2013-01-25 | Disposition: A | Discharge status: Home / Self Care | Payer: 59 | Source: Ambulatory Visit | Attending: Family Medicine | Admitting: Family Medicine

## 2013-01-25 ENCOUNTER — Other Ambulatory Visit: Payer: Self-pay | Admitting: Obstetrics and Gynecology

## 2013-01-25 DIAGNOSIS — M5412 Radiculopathy, cervical region: Secondary | ICD-10-CM

## 2013-01-25 DIAGNOSIS — R079 Chest pain, unspecified: Secondary | ICD-10-CM

## 2013-01-25 LAB — HM MAMMOGRAPHY: HM Mammogram: NORMAL

## 2013-01-26 ENCOUNTER — Encounter: Payer: Self-pay | Admitting: Family Medicine

## 2013-01-26 ENCOUNTER — Ambulatory Visit (INDEPENDENT_AMBULATORY_CARE_PROVIDER_SITE_OTHER): Payer: 59 | Admitting: Family Medicine

## 2013-01-26 VITALS — BP 138/80 | HR 80 | Ht 68.0 in | Wt 276.0 lb

## 2013-01-26 DIAGNOSIS — G609 Hereditary and idiopathic neuropathy, unspecified: Secondary | ICD-10-CM

## 2013-01-26 DIAGNOSIS — E559 Vitamin D deficiency, unspecified: Secondary | ICD-10-CM

## 2013-01-26 DIAGNOSIS — N926 Irregular menstruation, unspecified: Secondary | ICD-10-CM

## 2013-01-26 DIAGNOSIS — M940 Chondrocostal junction syndrome [Tietze]: Secondary | ICD-10-CM

## 2013-01-26 DIAGNOSIS — E039 Hypothyroidism, unspecified: Secondary | ICD-10-CM

## 2013-01-26 DIAGNOSIS — G629 Polyneuropathy, unspecified: Secondary | ICD-10-CM

## 2013-01-26 DIAGNOSIS — D509 Iron deficiency anemia, unspecified: Secondary | ICD-10-CM

## 2013-01-26 NOTE — Patient Instructions (Signed)
Avoid resting elbow on armrests.  Take breaks, if needed, from typing, to change position to help with arm and hand numbness. Go back to work 1/2 day tomorrow and Friday, and full day on Monday.  Return as scheduled in early December for labs. If your neuropathy isn't any better, we can consider doing nerve conduction studies and EMG.  But this is likely to continue to improve.  You have finished the naproxen prescription.  Switch over to over-the-counter Aleve twice daily with food.  If having a lot of pain, can take two pills twice daily with food (as long as you aren't having any stomach pain)--this is closer to the prescription dose you had been getting.  Your MRI of neck and thoracic spine were completely normal.  You have some residual costochondritis (inflammation of cartilage left chest) and muscle spasm in your left upper back/shoulder (trapezius muscle).  Continue the aleve, moist heat, massage, stretches, and robaxin (muscle relaxant) as needed.  Visit in December is just for labs.  If you are having ongoing pain/problems, schedule office visit.

## 2013-01-26 NOTE — Progress Notes (Signed)
Chief Complaint  Patient presents with  . Follow-up    10 day follow up.    Patient presents for follow up on her many complaints including chest pain, neck pain, left arm heaviness, numbness, tingling, and vaginal bleeding, anemia.  "I feel better".  The pain is less, but not completely gone. She is having less heaviness in the left upper arm, but still having numbness and tingling in the forearm, wrist and hand, including all 5 fingers, and palm.  Numbness is worse when using her hand a lot--cooking, picking up groceries, lifting 2 pound weights.  Develops hand numbness with pain radiating up the forearm with these activities. Elbows rest on armrest of chair while at work (but she hasn't been back to work yet).  Heaviness is almost gone.  Pain in the chest recurs if she leans back while sitting up in bed (leaning against back of bed, seated position)--relieved by sitting up in her bed. She continues to have some discomfort in her left lateral neck.  Heat/cold hasn't helped all that much, but improving  She hasn't been back to work yet.  She misses it, loves her work.     She had u/s yesterday (pelvic)--normal Period eventually stopped with the provera, but returned yesterday when seeing GYN.  Had u/s which showed thickened endometrium.  Recommended IUD or ablation.  Has appt next week.  Past Medical History  Diagnosis Date  . Hypothyroidism 1993  . 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 Multiple thyroid nodules   . Abnormal Pap smear early 2000's    had colposcopy, no additional treatment. normal paps since   No past surgical history on file.  History   Social History  . Marital Status: Married    Spouse Name: N/A    Number of Children: 3  . Years of Education: N/A   Occupational History  . 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 Smoking status: Never Smoker   . Smokeless tobacco: Never Used   . Alcohol Use: Yes     Comment: one drink every three months.  . Drug Use: No  . Sexual Activity: Yes    Partners: Male    Birth Control/ Protection: None   Other Topics Concern  . Not on file   Social History Narrative   Lives with husband, and 20 yo daughter.  Son is a police office in [place], has 1 grandchild.  Daughter is a Theatre stage manager in [place].  She moved here in 2005 from [place]   Current outpatient prescriptions:ergocalciferol (VITAMIN D2) 50000 UNITS capsule, Take 1 capsule (50,000 Units total) by mouth once a week., Disp: 12 capsule, Rfl: 0;  methocarbamol (ROBAXIN) 500 MG tablet, Take 1-2 tablets (500-1,000 mg total) by mouth 3 (three) times daily as needed (muscle spasm)., Disp: 40 tablet, Rfl: 0 naproxen (NAPROSYN) 500 MG tablet, Take 1 tablet (500 mg total) by mouth 2 (two) times daily with a meal., Disp: 30 tablet, Rfl: 0;  SYNTHROID 175 MCG tablet, Take 1 tablet (175 mcg total) by mouth daily before breakfast., Disp: 30 tablet, Rfl: 1;  traMADol (ULTRAM) 50 MG tablet, Take 1 tablet (50 mg total) by mouth every 6 (six) hours as needed for pain., Disp: 20 tablet, Rfl: 0  Allergies  Allergen Reactions  . Advil [Ibuprofen] Shortness Of Breath    And chest pain.  Tolerates naproxen without problems  . Iohexol  Desc: Pt states [place] 5 [place]rs ago and experienced severe sob and swelling. We did her scan w/o iv contrast today.        Thanks., Onset Date: [id number]    Updated allergies today--recalls having problems with advil in the past, but not having any problems with naproxen.  PHYSICAL EXAM: BP 138/80  Pulse 80  Ht 5\' 8"  (1.727 m)  Wt 276 lb (125.193 kg)  BMI 41.98 kg/m2  LMP 01/25/2013  Well developed, pleasant female in no distress Neck: no lymphadenopathy.  +thyromegaly, unchanged.  No c-spine tenderness.  Still tender at L trapezius, but no longer appreciate any significant spasm (improved from last visit) Heart: regular rate and  rhythm Chest wall: Tender at L costochondral junction at level of breast Lungs: clear bilaterally Abdomen: Mild epigastric tenderness, otherwise normal exam Extremities: no edema, 2+ pulses.  She has mild tenderness over medial elbow at level of ulnar nerve.  No swelling or warmth or erythema Neuro: alert and oriented. Cranial nerves intact. Normal strength, sensation, DTR's 2+ and symmetric.  Normal gait Psych: normal mood, affect, hygiene and grooming  Cervical and thoracic MRI reports reviewed with patient--entirely normal.  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 symptoms improving  Unspecified vitamin D deficiency - continue weekly rx and switch over to daily OTC once 3 months of therapy is completed  Iron deficiency anemia - continue iron; f/u with GYN as scheduled.    Costochondritis - L sided.  improving.  continue NSAIDs, moist heat prn  Abnormal menses - currently on withdrawal bleed from provera. f/u with GYN, continue iron  Peripheral neuropathy in LUE--suspect component of ulnar and median neuropathy.  Consider EMG/NCV--hold off for now; see if symptoms improve as thyroid corrects, and avoidance of resting arm/elbow.  If ongoing symptoms and normalizing TSH in December, then refer for EMG/NCV  F/u for labs as scheduled in early December. OV only if still having problems, or if abnormal labs, otherwise can give results by phone.  To return to work tomorrow and Friday this week for 1/2 day, then full time Monday 11/10. May need more frequent breaks from typing to change position, otherwise no limitations.  Forms filled out for her disability insurance.

## 2013-02-03 ENCOUNTER — Telehealth: Payer: Self-pay | Admitting: *Deleted

## 2013-02-03 NOTE — Telephone Encounter (Signed)
Patient called to give Dr.Knapp an update since going back to work. She stopped pain med and anti-inflammatory meds because it was making her stomach upset. Very nauseous and was vomiting. She did take the nausea medicine with the pain medicine and still felt nauseous so just stopped. Her left arm is no better, getting worse, she thinks it's because she is using it so much.

## 2013-02-03 NOTE — Telephone Encounter (Signed)
We can give her celebrex samples, to take 200mg  once daily (#12).  We can also refer her to PT if she is interested

## 2013-02-03 NOTE — Telephone Encounter (Signed)
Left message for patient to return my call.

## 2013-02-07 ENCOUNTER — Other Ambulatory Visit: Payer: Self-pay | Admitting: *Deleted

## 2013-02-07 ENCOUNTER — Telehealth: Payer: Self-pay | Admitting: *Deleted

## 2013-02-07 DIAGNOSIS — G609 Hereditary and idiopathic neuropathy, unspecified: Secondary | ICD-10-CM

## 2013-02-07 MED ORDER — CELECOXIB 200 MG PO CAPS: 200.0000 mg | ORAL_CAPSULE | Freq: Every day | ORAL | Status: DC

## 2013-02-07 NOTE — Telephone Encounter (Signed)
Spoke with patient and let her know that I sent referral order to [facility] for NCV/EMG, they will call her and schedule. Also put #12 celebrex 200mg  once daily up front for her to pick up.

## 2013-02-07 NOTE — Telephone Encounter (Signed)
Plan was to wait until December, but okay to schedule for EMG/NCV now.

## 2013-02-07 NOTE — Telephone Encounter (Signed)
Patient stated that you had talked about possibly sending her for some neurologic testing for numbness/tingling.

## 2013-02-15 ENCOUNTER — Other Ambulatory Visit: Payer: Self-pay | Admitting: Obstetrics and Gynecology

## 2013-02-16 ENCOUNTER — Ambulatory Visit (INDEPENDENT_AMBULATORY_CARE_PROVIDER_SITE_OTHER): Payer: 59 | Admitting: Neurology

## 2013-02-16 ENCOUNTER — Encounter (INDEPENDENT_AMBULATORY_CARE_PROVIDER_SITE_OTHER): Payer: Self-pay

## 2013-02-16 ENCOUNTER — Ambulatory Visit (INDEPENDENT_AMBULATORY_CARE_PROVIDER_SITE_OTHER): Payer: 59

## 2013-02-16 DIAGNOSIS — R209 Unspecified disturbances of skin sensation: Secondary | ICD-10-CM

## 2013-02-16 DIAGNOSIS — M79609 Pain in unspecified limb: Secondary | ICD-10-CM

## 2013-02-16 DIAGNOSIS — Z0289 Encounter for other administrative examinations: Secondary | ICD-10-CM

## 2013-02-16 NOTE — Procedures (Signed)
     HISTORY:  Gail Barr is a 47 year old patient with a history of sudden onset of numbness affecting the left face, arm, and leg that occurred on 01/05/2013. The patient also developed some left upper chest and neck discomfort, and some discomfort down the left arm. The patient is being evaluated for a possible neuropathy or a cervical radiculopathy.  NERVE CONDUCTION STUDIES:  Nerve conduction studies were performed on both upper extremities. The distal motor latencies and motor amplitudes for the median and ulnar nerves were within normal limits. The F wave latencies and nerve conduction velocities for these nerves were also normal. The sensory latencies for the median and ulnar nerves were normal.   EMG STUDIES:  EMG study was performed on the left upper extremity:  The first dorsal interosseous muscle reveals 2 to 4 K units with full recruitment. No fibrillations or positive waves were noted. The abductor pollicis brevis muscle reveals 2 to 4 K units with full recruitment. No fibrillations or positive waves were noted. The extensor indicis proprius muscle reveals 1 to 3 K units with full recruitment. No fibrillations or positive waves were noted. The pronator teres muscle reveals 2 to 3 K units with full recruitment. No fibrillations or positive waves were noted. The biceps muscle reveals 1 to 2 K units with full recruitment. No fibrillations or positive waves were noted. The triceps muscle reveals 2 to 4 K units with full recruitment. No fibrillations or positive waves were noted. The anterior deltoid muscle reveals 2 to 3 K units with full recruitment. No fibrillations or positive waves were noted. The cervical paraspinal muscles were tested at 2 levels. No abnormalities of insertional activity were seen at either level tested. There was good relaxation.   IMPRESSION:  Nerve conduction studies done on both upper extremities were within normal limits, without evidence of a  neuropathy. EMG evaluation of the left upper extremity was normal, without evidence of an overlying cervical radiculopathy.  Addendum: This patient describes a sensory alteration involving the face, arm, and leg on the left with maximal symptoms occurring within seconds of onset. The patient also described some visual blurring off of the left. A right thalamic stroke should be considered in this case. Thalamic strokes do not usually present with pain and discomfort, but this can be seen on occasion.  Marlan Palau MD 02/16/2013 1:50 PM  [facility] [address] [place], [place] [address]  Phone [phone] Fax [phone]

## 2013-02-22 ENCOUNTER — Other Ambulatory Visit: Payer: 59

## 2013-02-22 DIAGNOSIS — E039 Hypothyroidism, unspecified: Secondary | ICD-10-CM

## 2013-02-22 DIAGNOSIS — D509 Iron deficiency anemia, unspecified: Secondary | ICD-10-CM

## 2013-02-22 LAB — CBC WITH DIFFERENTIAL/PLATELET
Basophils Absolute: 0 10*3/uL (ref 0.0–0.1)
Eosinophils Relative: 1 % (ref 0–5)
Lymphocytes Relative: 25 % (ref 12–46)
MCV: 63.5 fL — ABNORMAL LOW (ref 78.0–100.0)
Monocytes Absolute: 0.6 10*3/uL (ref 0.1–1.0)
Neutro Abs: 5.6 10*3/uL (ref 1.7–7.7)
Neutrophils Relative %: 66 % (ref 43–77)
Platelets: 436 10*3/uL — ABNORMAL HIGH (ref 150–400)
RBC: 4.03 MIL/uL (ref 3.87–5.11)
RDW: 19.1 % — ABNORMAL HIGH (ref 11.5–15.5)
WBC: 8.5 10*3/uL (ref 4.0–10.5)

## 2013-02-22 LAB — FERRITIN: Ferritin: 1 ng/mL — ABNORMAL LOW (ref 10–291)

## 2013-02-22 LAB — TSH: TSH: 0.138 u[IU]/mL — ABNORMAL LOW (ref 0.350–4.500)

## 2013-02-24 ENCOUNTER — Other Ambulatory Visit: Payer: Self-pay | Admitting: *Deleted

## 2013-02-24 DIAGNOSIS — E039 Hypothyroidism, unspecified: Secondary | ICD-10-CM

## 2013-02-24 MED ORDER — SYNTHROID 150 MCG PO TABS: 150.0000 ug | ORAL_TABLET | Freq: Every day | ORAL | Status: DC

## 2013-02-25 ENCOUNTER — Encounter (HOSPITAL_COMMUNITY): Payer: Self-pay | Admitting: Pharmacist

## 2013-02-28 ENCOUNTER — Other Ambulatory Visit: Payer: Self-pay | Admitting: *Deleted

## 2013-02-28 DIAGNOSIS — G629 Polyneuropathy, unspecified: Secondary | ICD-10-CM

## 2013-03-01 MED ORDER — DEXTROSE 5 % IV SOLN
3.0000 g | INTRAVENOUS | Status: AC
  Administered 2013-03-02: 3 g via INTRAVENOUS
  Filled 2013-03-01: qty 3000

## 2013-03-02 ENCOUNTER — Ambulatory Visit (HOSPITAL_COMMUNITY): Payer: 59 | Admitting: Anesthesiology

## 2013-03-02 ENCOUNTER — Encounter (HOSPITAL_COMMUNITY): Payer: 59 | Admitting: Anesthesiology

## 2013-03-02 ENCOUNTER — Encounter (HOSPITAL_COMMUNITY): Payer: Self-pay | Admitting: *Deleted

## 2013-03-02 ENCOUNTER — Ambulatory Visit (HOSPITAL_COMMUNITY)
Admission: RE | Admit: 2013-03-02 | Discharge: 2013-03-02 | Disposition: A | Discharge status: Home / Self Care | Payer: 59 | Source: Ambulatory Visit | Attending: Obstetrics and Gynecology | Admitting: Obstetrics and Gynecology

## 2013-03-02 ENCOUNTER — Encounter (HOSPITAL_COMMUNITY)
Admission: RE | Disposition: A | Discharge status: Home / Self Care | Payer: Self-pay | Source: Ambulatory Visit | Attending: Obstetrics and Gynecology

## 2013-03-02 DIAGNOSIS — N920 Excessive and frequent menstruation with regular cycle: Secondary | ICD-10-CM | POA: Insufficient documentation

## 2013-03-02 DIAGNOSIS — N840 Polyp of corpus uteri: Secondary | ICD-10-CM | POA: Insufficient documentation

## 2013-03-02 HISTORY — PX: DILITATION & CURRETTAGE/HYSTROSCOPY WITH NOVASURE ABLATION: SHX5568

## 2013-03-02 LAB — CBC
MCH: 19.4 pg — ABNORMAL LOW (ref 26.0–34.0)
MCV: 63.8 fL — ABNORMAL LOW (ref 78.0–100.0)
Platelets: 546 10*3/uL — ABNORMAL HIGH (ref 150–400)
RDW: 18.3 % — ABNORMAL HIGH (ref 11.5–15.5)

## 2013-03-02 SURGERY — DILATATION & CURETTAGE/HYSTEROSCOPY WITH NOVASURE ABLATION
Anesthesia: General | Site: Vagina

## 2013-03-02 MED ORDER — MEPERIDINE HCL 25 MG/ML IJ SOLN: 6.2500 mg | INTRAMUSCULAR | Status: DC | PRN

## 2013-03-02 MED ORDER — MIDAZOLAM HCL 2 MG/2ML IJ SOLN
INTRAMUSCULAR | Status: AC
  Filled 2013-03-02: qty 2

## 2013-03-02 MED ORDER — LIDOCAINE HCL (CARDIAC) 20 MG/ML IV SOLN
INTRAVENOUS | Status: AC
  Filled 2013-03-02: qty 5

## 2013-03-02 MED ORDER — FENTANYL CITRATE 0.05 MG/ML IJ SOLN: 25.0000 ug | INTRAMUSCULAR | Status: DC | PRN

## 2013-03-02 MED ORDER — LACTATED RINGERS IV SOLN
INTRAVENOUS | Status: DC
  Administered 2013-03-02: 11:00:00 via INTRAVENOUS
  Administered 2013-03-02: 1000 mL via INTRAVENOUS

## 2013-03-02 MED ORDER — FENTANYL CITRATE 0.05 MG/ML IJ SOLN
INTRAMUSCULAR | Status: AC
  Filled 2013-03-02: qty 5

## 2013-03-02 MED ORDER — ONDANSETRON HCL 4 MG/2ML IJ SOLN
INTRAMUSCULAR | Status: AC
  Filled 2013-03-02: qty 2

## 2013-03-02 MED ORDER — OXYCODONE-ACETAMINOPHEN 5-325 MG PO TABS
1.0000 | ORAL_TABLET | ORAL | Status: DC | PRN
  Administered 2013-03-02: 1 via ORAL

## 2013-03-02 MED ORDER — ONDANSETRON HCL 4 MG/2ML IJ SOLN
INTRAMUSCULAR | Status: DC | PRN
  Administered 2013-03-02: 4 mg via INTRAVENOUS

## 2013-03-02 MED ORDER — OXYCODONE-ACETAMINOPHEN 5-325 MG PO TABS
ORAL_TABLET | ORAL | Status: AC
  Filled 2013-03-02: qty 1

## 2013-03-02 MED ORDER — FENTANYL CITRATE 0.05 MG/ML IJ SOLN
INTRAMUSCULAR | Status: DC | PRN
  Administered 2013-03-02 (×2): 50 ug via INTRAVENOUS

## 2013-03-02 MED ORDER — PHENYLEPHRINE HCL 10 MG/ML IJ SOLN
INTRAMUSCULAR | Status: DC | PRN
  Administered 2013-03-02 (×3): 40 ug via INTRAVENOUS

## 2013-03-02 MED ORDER — LIDOCAINE HCL 1 % IJ SOLN
INTRAMUSCULAR | Status: DC | PRN
  Administered 2013-03-02: 10 mL

## 2013-03-02 MED ORDER — PROPOFOL 10 MG/ML IV BOLUS
INTRAVENOUS | Status: DC | PRN
  Administered 2013-03-02: 250 mg via INTRAVENOUS

## 2013-03-02 MED ORDER — LACTATED RINGERS IR SOLN
Status: DC | PRN
  Administered 2013-03-02: 3000 mL

## 2013-03-02 MED ORDER — LIDOCAINE HCL (CARDIAC) 20 MG/ML IV SOLN
INTRAVENOUS | Status: DC | PRN
  Administered 2013-03-02: 50 mg via INTRAVENOUS

## 2013-03-02 MED ORDER — OXYCODONE-ACETAMINOPHEN 10-325 MG PO TABS: 1.0000 | ORAL_TABLET | ORAL | Status: DC | PRN

## 2013-03-02 MED ORDER — PHENYLEPHRINE 40 MCG/ML (10ML) SYRINGE FOR IV PUSH (FOR BLOOD PRESSURE SUPPORT)
PREFILLED_SYRINGE | INTRAVENOUS | Status: AC
  Filled 2013-03-02: qty 5

## 2013-03-02 MED ORDER — PROPOFOL 10 MG/ML IV EMUL
INTRAVENOUS | Status: AC
  Filled 2013-03-02: qty 40

## 2013-03-02 MED ORDER — LIDOCAINE HCL 1 % IJ SOLN
INTRAMUSCULAR | Status: AC
  Filled 2013-03-02: qty 20

## 2013-03-02 MED ORDER — METOCLOPRAMIDE HCL 5 MG/ML IJ SOLN: 10.0000 mg | Freq: Once | INTRAMUSCULAR | Status: DC | PRN

## 2013-03-02 MED ORDER — MIDAZOLAM HCL 2 MG/2ML IJ SOLN
INTRAMUSCULAR | Status: DC | PRN
  Administered 2013-03-02: 1 mg via INTRAVENOUS

## 2013-03-02 SURGICAL SUPPLY — 11 items
ABLATOR ENDOMETRIAL BIPOLAR (ABLATOR) ×2 IMPLANT
CATH ROBINSON RED A/P 16FR (CATHETERS) ×2 IMPLANT
CLOTH BEACON ORANGE TIMEOUT ST (SAFETY) ×2 IMPLANT
CONTAINER PREFILL 10% NBF 60ML (FORM) ×1 IMPLANT
DRESSING TELFA 8X3 (GAUZE/BANDAGES/DRESSINGS) ×2 IMPLANT
GLOVE ECLIPSE 7.0 STRL STRAW (GLOVE) ×4 IMPLANT
GOWN STRL REIN XL XLG (GOWN DISPOSABLE) ×4 IMPLANT
PACK HYSTEROSCOPY LF (CUSTOM PROCEDURE TRAY) ×2 IMPLANT
PAD OB MATERNITY 4.3X12.25 (PERSONAL CARE ITEMS) ×2 IMPLANT
TOWEL OR 17X24 6PK STRL BLUE (TOWEL DISPOSABLE) ×4 IMPLANT
WATER STERILE IRR 1000ML POUR (IV SOLUTION) ×2 IMPLANT

## 2013-03-02 NOTE — H&P (Signed)
Gail Barr is an 47 y/o black female who presents for a ablation secondary to menorrhagia. She has a normal U/S, She has become anemic from this in the past. Chief Complaint: HPI:  Past Medical History  Diagnosis Date  . Hypothyroidism 1993  . 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 Multiple thyroid nodules   . Abnormal Pap smear early 2000's    had colposcopy, no additional treatment. normal paps since    History reviewed. No pertinent past surgical history.  Family History  Problem Relation Age of Onset  . Asthma Brother   . Diabetes Maternal Aunt   . Diabetes Maternal Uncle   . Diabetes Maternal Grandmother   . Cancer Maternal Grandfather     prostate cancer  . Asthma Brother    Social History:  reports that she has never smoked. She has never used smokeless tobacco. She reports that she drinks alcohol. She reports that she does not use illicit drugs.  Allergies:  Allergies  Allergen Reactions  . Advil [Ibuprofen] Shortness Of Breath    And chest pain.  Tolerates naproxen without problems  . Betadine [Povidone Iodine]   . Iohexol      Desc: Pt states [place] 5 [place] and experienced severe sob and swelling. We did her scan w/o iv contrast today.        Thanks., Onset Date: [id number]     Medications Prior to Admission  Medication Sig Dispense Refill  . levothyroxine (SYNTHROID, LEVOTHROID) 175 MCG tablet Take 175 mcg by mouth daily before breakfast.      . Multiple Vitamins-Iron (MULTIVITAMINS WITH IRON) TABS tablet Take 1 tablet by mouth daily.         Pertinent items are noted in HPI.  Blood pressure 142/81, pulse 78, temperature 98.6 F (37 C), temperature source Oral, resp. rate 16, height 5\' 8"  (1.727 m), weight 120.203 kg (265 lb), last menstrual period 01/26/2013, SpO2 98.00%. BP 142/81  Pulse 78  Temp(Src) 98.6 F (37 C) (Oral)  Resp 16  Ht 5\' 8"  (1.727  m)  Wt 120.203 kg (265 lb)  BMI 40.30 kg/m2  SpO2 98%  LMP 01/26/2013 General appearance: alert and cooperative Abdomen: soft, non-tender; bowel sounds normal; no masses,  no organomegaly Pelvic: cervix normal in appearance, external genitalia normal, no adnexal masses or tenderness, no cervical motion tenderness, rectovaginal septum normal, uterus normal size, shape, and consistency and vagina normal without discharge   Lab Results  Component Value Date   WBC 8.5 02/22/2013   HGB 7.9* 02/22/2013   HCT 25.6* 02/22/2013   MCV 63.5* 02/22/2013   PLT 436* 02/22/2013   Lab Results  Component Value Date   PREGTESTUR  Value: NEGATIVE        THE SENSITIVITY OF THIS METHODOLOGY IS >24 mIU/mL 06/24/2007     Assessment/Plan Proceed with Ablation  Patient Active Problem List   Diagnosis Date Noted  . Costochondritis 01/10/2013  . Abnormal menses 01/10/2013  . Iron deficiency anemia 08/25/2011  . Unspecified vitamin D deficiency 07/08/2011  . Unspecified hypothyroidism 07/07/2011  . Obesity, Class III, BMI 40-49.9 (morbid obesity) 07/07/2011   Menorrhagia Proceed with ablation  ANDERSON,MARK E 03/02/2013, 11:20 AM

## 2013-03-02 NOTE — Anesthesia Postprocedure Evaluation (Signed)
  Anesthesia Post-op Note  Patient: Gail Barr  Procedure(s) Performed: Procedure(s): DILATATION & CURETTAGE/HYSTEROSCOPY WITH NOVASURE ABLATION (N/A)  Patient Location: PACU  Anesthesia Type:General  Level of Consciousness: awake, alert  and oriented  Airway and Oxygen Therapy: Patient Spontanous Breathing  Post-op Pain: mild  Post-op Assessment: Post-op Vital signs reviewed, Patient's Cardiovascular Status Stable, Respiratory Function Stable, Patent Airway, No signs of Nausea or vomiting and Pain level controlled  Post-op Vital Signs: Reviewed and stable  Complications: No apparent anesthesia complications

## 2013-03-02 NOTE — Anesthesia Preprocedure Evaluation (Addendum)
Anesthesia Evaluation  Patient identified by MRN, date of birth, ID band Patient awake    Reviewed: Allergy & Precautions, H&P , NPO status , Patient's Chart, lab work & pertinent test results  Airway Mallampati: III TM Distance: >3 FB Neck ROM: Full    Dental no notable dental hx. (+) Teeth Intact   Pulmonary neg pulmonary ROS,  breath sounds clear to auscultation  Pulmonary exam normal       Cardiovascular negative cardio ROS  Rhythm:Regular Rate:Normal     Neuro/Psych Had sudden onset of numbness and tingling left side of face and left arm and leg in 12/2012 with some chest tightness. ? TIA or CVA vs cervical radiculopathy. Currently being worked up by Neurology. negative psych ROS   GI/Hepatic negative GI ROS, Neg liver ROS,   Endo/Other  Hypothyroidism Morbid obesity  Renal/GU Renal diseaseHx/o Pyelonephritis  negative genitourinary   Musculoskeletal negative musculoskeletal ROS (+)   Abdominal (+) + obese,   Peds  Hematology  (+) anemia ,   Anesthesia Other Findings   Reproductive/Obstetrics Menorrhagia                         Anesthesia Physical Anesthesia Plan  ASA: III  Anesthesia Plan: General   Post-op Pain Management:    Induction: Intravenous  Airway Management Planned: LMA  Additional Equipment:   Intra-op Plan:   Post-operative Plan: Extubation in OR  Informed Consent: I have reviewed the patients History and Physical, chart, labs and discussed the procedure including the risks, benefits and alternatives for the proposed anesthesia with the patient or authorized representative who has indicated his/her understanding and acceptance.   Dental advisory given  Plan Discussed with: CRNA, Anesthesiologist and Surgeon  Anesthesia Plan Comments:         Anesthesia Quick Evaluation

## 2013-03-02 NOTE — Transfer of Care (Signed)
Immediate Anesthesia Transfer of Care Note  Patient: Gail Barr  Procedure(s) Performed: Procedure(s): DILATATION & CURETTAGE/HYSTEROSCOPY WITH NOVASURE ABLATION (N/A)  Patient Location: PACU  Anesthesia Type:General  Level of Consciousness: awake  Airway & Oxygen Therapy: Patient Spontanous Breathing and Patient connected to nasal cannula oxygen  Post-op Assessment: Report given to PACU RN and Post -op Vital signs reviewed and stable  Post vital signs: Reviewed and stable  Complications: No apparent anesthesia complications

## 2013-03-03 ENCOUNTER — Encounter: Payer: Self-pay | Admitting: *Deleted

## 2013-03-03 ENCOUNTER — Encounter (HOSPITAL_COMMUNITY): Payer: Self-pay | Admitting: Obstetrics and Gynecology

## 2013-03-03 ENCOUNTER — Telehealth: Payer: Self-pay | Admitting: Internal Medicine

## 2013-03-03 NOTE — Telephone Encounter (Signed)
Faxed over medical records to [facility] @ [phone]

## 2013-03-05 NOTE — Op Note (Signed)
Gail Barr, MIGUEZ NO.:  [id number]  MEDICAL RECORD NO.:  [id number]  LOCATION:  [facility]                          FACILITY:  [facility]  PHYSICIAN:  Malva Limes, M.D.    DATE OF BIRTH:  1965/05/23  DATE OF PROCEDURE: DATE OF DISCHARGE:  03/02/2013                              OPERATIVE REPORT   PREOPERATIVE DIAGNOSIS:  Menorrhagia.  POSTOPERATIVE DIAGNOSIS:  Menorrhagia with endometrial polyp.  PROCEDURES: 1. Hysteroscopy. 2. Dilation and curettage. 3. Endometrial ablation with NovaSure.  SURGEON:  Malva Limes, M.D.  ANESTHESIA:  Local and general.  ANTIBIOTICS:  Ancef 2 g.  SPECIMENS:  Endometrial curettings sent to pathology.  COMPLICATIONS:  None.  PROCEDURE IN DETAIL:  The patient was taken to the operating room, where she was placed in dorsal supine position.  A general anesthetic was administered without difficulty.  She was placed in a dorsal lithotomy position.  She was prepped and draped in usual fashion for this procedure.  A sterile speculum was placed in vagina, 20 mL of 1% lidocaine was used for paracervical block.  A single-tooth tenaculum was applied to the anterior cervical lip.  The cervix was serially dilated to a 27-French.  The hysteroscope was advanced through the endocervical canal, which appeared to be normal.  On entering the uterine cavity both ostia was visualized.  The patient did have 2-3 polyps.  The hysteroscope was removed.  Sharp curettage was performed and tissue sent to pathology.  Following this, the NovaSure device was placed into the uterine cavity and opened, a seating procedure was performed.  The uterine length was 6.5 and the width 4.7.  The seal test was performed and passed.  The device was turned on for 1 minute at 150 watts.  The device was then removed.  The patient was awoken and taken to recovery room in stable condition.  Instrument, lap counts correct x2.  The patient was discharged to home.  Instructed  to follow up in the office in 2 weeks.          ______________________________ Malva Limes, M.D.     MA/MEDQ  D:  03/04/2013  T:  03/05/2013  Job:  [id number]

## 2013-03-08 ENCOUNTER — Ambulatory Visit
Admission: RE | Admit: 2013-03-08 | Discharge: 2013-03-08 | Disposition: A | Discharge status: Home / Self Care | Payer: 59 | Source: Ambulatory Visit | Attending: Family Medicine | Admitting: Family Medicine

## 2013-03-08 DIAGNOSIS — G629 Polyneuropathy, unspecified: Secondary | ICD-10-CM

## 2013-03-31 ENCOUNTER — Encounter: Payer: Self-pay | Admitting: Family Medicine

## 2013-04-06 ENCOUNTER — Other Ambulatory Visit: Payer: Self-pay

## 2013-04-13 ENCOUNTER — Other Ambulatory Visit: Payer: 59

## 2013-04-13 DIAGNOSIS — E039 Hypothyroidism, unspecified: Secondary | ICD-10-CM

## 2013-04-14 LAB — TSH: TSH: 32.065 u[IU]/mL — AB (ref 0.350–4.500)

## 2013-04-21 ENCOUNTER — Ambulatory Visit (INDEPENDENT_AMBULATORY_CARE_PROVIDER_SITE_OTHER): Payer: 59 | Admitting: Family Medicine

## 2013-04-21 ENCOUNTER — Encounter: Payer: Self-pay | Admitting: Family Medicine

## 2013-04-21 VITALS — BP 116/72 | HR 76 | Ht 68.0 in | Wt 272.0 lb

## 2013-04-21 DIAGNOSIS — E039 Hypothyroidism, unspecified: Secondary | ICD-10-CM

## 2013-04-21 DIAGNOSIS — D509 Iron deficiency anemia, unspecified: Secondary | ICD-10-CM

## 2013-04-21 LAB — CBC WITH DIFFERENTIAL/PLATELET
Basophils Absolute: 0.1 10*3/uL (ref 0.0–0.1)
Basophils Relative: 1 % (ref 0–1)
EOS ABS: 0.1 10*3/uL (ref 0.0–0.7)
Eosinophils Relative: 2 % (ref 0–5)
HCT: 27.6 % — ABNORMAL LOW (ref 36.0–46.0)
Hemoglobin: 8.4 g/dL — ABNORMAL LOW (ref 12.0–15.0)
LYMPHS ABS: 2 10*3/uL (ref 0.7–4.0)
Lymphocytes Relative: 29 % (ref 12–46)
MCH: 18.6 pg — AB (ref 26.0–34.0)
MCHC: 30.4 g/dL (ref 30.0–36.0)
MCV: 61.1 fL — ABNORMAL LOW (ref 78.0–100.0)
Monocytes Absolute: 0.4 10*3/uL (ref 0.1–1.0)
Monocytes Relative: 6 % (ref 3–12)
NEUTROS ABS: 4.3 10*3/uL (ref 1.7–7.7)
NEUTROS PCT: 62 % (ref 43–77)
PLATELETS: 602 10*3/uL — AB (ref 150–400)
RBC: 4.52 MIL/uL (ref 3.87–5.11)
RDW: 20.7 % — ABNORMAL HIGH (ref 11.5–15.5)
WBC: 6.9 10*3/uL (ref 4.0–10.5)

## 2013-04-21 LAB — FERRITIN: Ferritin: 3 ng/mL — ABNORMAL LOW (ref 10–291)

## 2013-04-21 MED ORDER — SYNTHROID 175 MCG PO TABS: 175.0000 ug | ORAL_TABLET | Freq: Every day | ORAL | Status: DC

## 2013-04-21 NOTE — Patient Instructions (Signed)
  Change taking your vitamin from the morning, to taking it with dinner.  It needs to be separated from your thyroid medication by 4 hours (and separate your thyroid medication from food by 1 hour)  We are increasing your dose back to 13mcg daily.  If this (again) is too much medication for you, then we will need to come up with a dosing regimen that alternates between the 150 and 175 (as there is no dose in between these, and the 150 is not strong enough).

## 2013-04-21 NOTE — Progress Notes (Signed)
Chief Complaint  Patient presents with  . Follow-up    TSH follow up, brought bottles as requested. Would also like to know iif we could do 90 days rx's on thyroid medication as it is cheaper for her. All meds reconciled.    Patient was asked to schedule appt to discuss her thyroid results--which have been up and down.  In December, TSH was 0.138, so dose was decreased from 175 to 185mcg.  She has only missed 1 dose in the 6 weeks prior, and TSH was 32.065 on 145mcg.  She takes the thyroid medication first thing in the morning, on empty stomach.  She takes her other medications when she eats, about 1.5-2 hours later. She continues to have tingling/numbness in her hands.  She had dry hair and hair loss, with some thinning back in November/December.  Hair is getting better now.  She isn't as tired as she used to be (she had endometrial ablation for significant iron deficiency anemia).  She is always hot, and always has been, no changes in that.  She previously was constipated, about a month ago (pain meds from surgery, iron prior to surgery); improved now.  She felt immediately better when she was put on the 175. Pain meds after surgery made her feel sick. Currently she feels fine.    She hasn't had CBC checked since the ablation, but energy is better.  appt for f/u with Dr. Ouida Sills was rescheduled.  She started having very light spotting in the last few days, her first "period" since the ablation.  Past Medical History  Diagnosis Date  . Hypothyroidism 1993  . 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 Multiple thyroid nodules   . Abnormal Pap smear early 2000's    had colposcopy, no additional treatment. normal paps since   Past Surgical History  Procedure Laterality Date  . Dilitation & currettage/hystroscopy with novasure ablation N/A 03/02/2013    Procedure: DILATATION & CURETTAGE/HYSTEROSCOPY WITH NOVASURE ABLATION;   Surgeon: Olga Millers, MD;  Location: [facility];  Service: Gynecology;  Laterality: N/A;   History   Social History  . Marital Status: Married    Spouse Name: N/A    Number of Children: 3  . Years of Education: N/A   Occupational History  . 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 Smoking status: Never Smoker   . Smokeless tobacco: Never Used  . Alcohol Use: Yes     Comment: one drink every three months.  . Drug Use: No  . Sexual Activity: Yes    Partners: Male    Birth Control/ Protection: None   Other Topics Concern  . Not on file   Social History Narrative   Lives with husband, and 97 yo daughter.  Son is a police office in [place], has 1 grandchild.  Daughter is a Presenter, broadcasting in [place].  She moved here in 2005 from [place] Prescriptions on File Prior to Visit  Medication Sig Dispense Refill  . Multiple Vitamins-Iron (MULTIVITAMINS WITH IRON) TABS tablet Take 1 tablet by mouth daily.       No current facility-administered medications on file prior to visit.   Synthroid 134mcg prior to visit today  Allergies  Allergen Reactions  . Advil [Ibuprofen] Shortness Of Breath    And chest pain.  Tolerates naproxen without problems  . Betadine [Povidone Iodine]   . Iohexol  Desc: Pt states she [place] approx 5 yrs ago and experienced severe sob and swelling. We did her scan w/o iv contrast today.        Thanks., Onset Date: [id number]    ROS:  Denies fevers, chills, weight changes, dizziness, chest pain, URI symptoms, cough, shortness of breath, nausea, vomiting, diarrhea, constipation, bleeding/bruising.  Occasional headaches.  She just got new glasses, which help some.  See HPI  PHYSICAL EXAM: BP 116/72  Pulse 76  Ht 5\' 8"  (1.727 m)  Wt 272 lb (123.378 kg)  BMI 41.37 kg/m2  LMP 04/19/2013 Well developed, pleasant obese female in no distress HEENT:  PERRL, EOMI, conjunctiva clear Neck: no lymphadenopathy. Mild diffuse  enlargement of thyroid Heart: regular rate and rhythm Lungs: clear bilaterally Extremities: no edema Psych: normal mood, affect, hygiene and grooming  ASSESSMENT/PLAN:  Unspecified hypothyroidism - Plan: SYNTHROID 175 MCG tablet  Iron deficiency anemia - Plan: CBC with Differential, Ferritin   Hypothyroidism--inadequately replaced on 150 mcg Synthroid (but taking med a little close to her vitamins); over-replaced when on 161mcg.  Re-try the 175.  If is again over-replaced, then will need to cut back the dose, and also use the 187mcg to get a dose between the two.  Pt agrees and understands plan, and how to properly take the medication.  Change taking your vitamin from the morning, to taking it with dinner.  It needs to be separated from your thyroid medication by 4 hours (and separate your thyroid medication from food by 1 hour)  Iron deficiency anemia--energy is improved since ablation.  Due for repeat labs.  Will do today and forward to Dr. Addison Naegeli Should be improving. She is not currently taking iron.

## 2013-04-25 ENCOUNTER — Other Ambulatory Visit: Payer: Self-pay | Admitting: *Deleted

## 2013-04-25 DIAGNOSIS — E039 Hypothyroidism, unspecified: Secondary | ICD-10-CM

## 2013-06-01 ENCOUNTER — Other Ambulatory Visit: Payer: Self-pay

## 2013-06-07 ENCOUNTER — Other Ambulatory Visit: Payer: Self-pay

## 2013-06-08 ENCOUNTER — Other Ambulatory Visit: Payer: 59

## 2013-06-08 DIAGNOSIS — E039 Hypothyroidism, unspecified: Secondary | ICD-10-CM

## 2013-06-09 LAB — TSH: TSH: 0.989 u[IU]/mL (ref 0.350–4.500)

## 2013-06-13 ENCOUNTER — Other Ambulatory Visit: Payer: Self-pay | Admitting: *Deleted

## 2013-06-13 DIAGNOSIS — E039 Hypothyroidism, unspecified: Secondary | ICD-10-CM

## 2013-06-13 MED ORDER — SYNTHROID 175 MCG PO TABS: 175.0000 ug | ORAL_TABLET | Freq: Every day | ORAL | Status: DC

## 2013-07-19 ENCOUNTER — Other Ambulatory Visit: Payer: Self-pay | Admitting: Family Medicine

## 2013-10-13 ENCOUNTER — Other Ambulatory Visit: Payer: Self-pay

## 2013-12-11 ENCOUNTER — Encounter (HOSPITAL_COMMUNITY): Payer: Self-pay | Admitting: Emergency Medicine

## 2013-12-11 DIAGNOSIS — E039 Hypothyroidism, unspecified: Secondary | ICD-10-CM | POA: Diagnosis not present

## 2013-12-11 DIAGNOSIS — T148XXA Other injury of unspecified body region, initial encounter: Secondary | ICD-10-CM | POA: Insufficient documentation

## 2013-12-11 DIAGNOSIS — Y939 Activity, unspecified: Secondary | ICD-10-CM | POA: Insufficient documentation

## 2013-12-11 DIAGNOSIS — X58XXXA Exposure to other specified factors, initial encounter: Secondary | ICD-10-CM | POA: Diagnosis not present

## 2013-12-11 DIAGNOSIS — M542 Cervicalgia: Secondary | ICD-10-CM | POA: Diagnosis not present

## 2013-12-11 DIAGNOSIS — R51 Headache: Secondary | ICD-10-CM | POA: Diagnosis present

## 2013-12-11 DIAGNOSIS — Y929 Unspecified place or not applicable: Secondary | ICD-10-CM | POA: Diagnosis not present

## 2013-12-11 DIAGNOSIS — M549 Dorsalgia, unspecified: Secondary | ICD-10-CM | POA: Insufficient documentation

## 2013-12-11 DIAGNOSIS — Z79899 Other long term (current) drug therapy: Secondary | ICD-10-CM | POA: Insufficient documentation

## 2013-12-11 DIAGNOSIS — G44229 Chronic tension-type headache, not intractable: Secondary | ICD-10-CM | POA: Diagnosis not present

## 2013-12-11 DIAGNOSIS — R0789 Other chest pain: Secondary | ICD-10-CM | POA: Diagnosis not present

## 2013-12-11 DIAGNOSIS — Z87448 Personal history of other diseases of urinary system: Secondary | ICD-10-CM | POA: Insufficient documentation

## 2013-12-11 NOTE — ED Notes (Signed)
The pt has  Had a headache nauseated no vomiting.  She is also c/o chest and lt arm pain for a  Week aLSO .  SHE HAS HAD SIMILAR EPISODES PREVIOUSLY  That was worked up but no definite diagnosis was made.  lmp  2 weeks ago

## 2013-12-11 NOTE — ED Notes (Signed)
She also has some chest tightness and fells like she cannot get a deep breath

## 2013-12-12 ENCOUNTER — Emergency Department (HOSPITAL_COMMUNITY): Payer: 59

## 2013-12-12 ENCOUNTER — Emergency Department (HOSPITAL_COMMUNITY)
Admission: EM | Admit: 2013-12-12 | Discharge: 2013-12-12 | Disposition: A | Discharge status: Home / Self Care | Payer: 59 | Attending: Emergency Medicine | Admitting: Emergency Medicine

## 2013-12-12 DIAGNOSIS — G44209 Tension-type headache, unspecified, not intractable: Secondary | ICD-10-CM

## 2013-12-12 DIAGNOSIS — T148XXA Other injury of unspecified body region, initial encounter: Secondary | ICD-10-CM

## 2013-12-12 DIAGNOSIS — R0789 Other chest pain: Secondary | ICD-10-CM

## 2013-12-12 LAB — CBC
HEMATOCRIT: 34.5 % — AB (ref 36.0–46.0)
Hemoglobin: 11.4 g/dL — ABNORMAL LOW (ref 12.0–15.0)
MCH: 23.6 pg — ABNORMAL LOW (ref 26.0–34.0)
MCHC: 33 g/dL (ref 30.0–36.0)
MCV: 71.4 fL — ABNORMAL LOW (ref 78.0–100.0)
Platelets: 366 10*3/uL (ref 150–400)
RBC: 4.83 MIL/uL (ref 3.87–5.11)
RDW: 19.3 % — ABNORMAL HIGH (ref 11.5–15.5)
WBC: 9.1 10*3/uL (ref 4.0–10.5)

## 2013-12-12 LAB — BASIC METABOLIC PANEL
Anion gap: 11 (ref 5–15)
BUN: 11 mg/dL (ref 6–23)
CALCIUM: 9 mg/dL (ref 8.4–10.5)
CO2: 24 meq/L (ref 19–32)
Chloride: 102 mEq/L (ref 96–112)
Creatinine, Ser: 0.9 mg/dL (ref 0.50–1.10)
GFR calc Af Amer: 88 mL/min — ABNORMAL LOW (ref >90)
GFR calc non Af Amer: 76 mL/min — ABNORMAL LOW (ref >90)
GLUCOSE: 234 mg/dL — AB (ref 70–99)
Potassium: 4.4 mEq/L (ref 3.7–5.3)
SODIUM: 137 meq/L (ref 137–147)

## 2013-12-12 LAB — TROPONIN I: Troponin I: <0.3 ng/mL (ref <0.30)

## 2013-12-12 LAB — PRO B NATRIURETIC PEPTIDE: PRO B NATRI PEPTIDE: 238.9 pg/mL — AB (ref 0–125)

## 2013-12-12 MED ORDER — METHOCARBAMOL 500 MG PO TABS
1000.0000 mg | ORAL_TABLET | Freq: Once | ORAL | Status: AC
  Administered 2013-12-12: 1000 mg via ORAL
  Filled 2013-12-12: qty 2

## 2013-12-12 MED ORDER — KETOROLAC TROMETHAMINE 60 MG/2ML IM SOLN
60.0000 mg | Freq: Once | INTRAMUSCULAR | Status: AC
  Administered 2013-12-12: 60 mg via INTRAMUSCULAR
  Filled 2013-12-12: qty 2

## 2013-12-12 MED ORDER — METHOCARBAMOL 500 MG PO TABS: 1000.0000 mg | ORAL_TABLET | Freq: Two times a day (BID) | ORAL | Status: DC

## 2013-12-12 NOTE — ED Provider Notes (Signed)
CSN: [id number]     Arrival date & time 12/11/13  2336 History   First MD Initiated Contact with Patient 12/12/13 0210     Chief Complaint  Patient presents with  . Headache     (Consider location/radiation/quality/duration/timing/severity/associated sxs/prior Treatment) HPI Patient presents with 2 days of gradual increasing pain on the left side. She states it started in the left triceps area and then progressed to the left deltoid and left lateral chest. If now progressed to the left thoracic back and cervical spine as well as posterior occipital area. The pain is worse with movement. She denies any trauma. No known heavy lifting. She's had no shortness of breath though she states she has some mild pain with deep inspiration. No cough, fever or chills. She's had no increased lower extremity swelling or pain. Pain was gradual in onset. She has no visual symptoms. She has no focal weakness or numbness. No recent extended travel or surgeries. Past Medical History  Diagnosis Date  . Hypothyroidism 1993  . 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 Multiple thyroid nodules   . Abnormal Pap smear early 2000's    had colposcopy, no additional treatment. normal paps since   Past Surgical History  Procedure Laterality Date  . Dilitation & currettage/hystroscopy with novasure ablation N/A 03/02/2013    Procedure: DILATATION & CURETTAGE/HYSTEROSCOPY WITH NOVASURE ABLATION;  Surgeon: Olga Millers, MD;  Location: [facility];  Service: Gynecology;  Laterality: N/A;   Family History  Problem Relation Age of Onset  . Asthma Brother   . Diabetes Maternal Aunt   . Diabetes Maternal Uncle   . Diabetes Maternal Grandmother   . Cancer Maternal Grandfather     prostate cancer  . Asthma Brother    History  Substance Use Topics  . Smoking status: Never Smoker   . Smokeless tobacco: Never Used  . Alcohol Use: Yes     Comment: one  drink every three months.   OB History   Grav Para Term Preterm Abortions TAB SAB Ect Mult Living   6 3   3  3   3      Review of Systems  Constitutional: Negative for fever and chills.  Respiratory: Negative for cough and shortness of breath.   Cardiovascular: Positive for chest pain. Negative for palpitations and leg swelling.  Gastrointestinal: Negative for nausea, vomiting, abdominal pain, diarrhea and constipation.  Genitourinary: Negative for dysuria and flank pain.  Musculoskeletal: Positive for back pain, myalgias and neck pain. Negative for arthralgias and neck stiffness.  Skin: Negative for rash and wound.  Neurological: Positive for headaches. Negative for dizziness, syncope, weakness, light-headedness and numbness.  All other systems reviewed and are negative.     Allergies  Advil; Betadine; and Iohexol  Home Medications   Prior to Admission medications   Medication Sig Start Date End Date Taking? Authorizing Provider  levothyroxine (SYNTHROID, LEVOTHROID) 175 MCG tablet Take 175 mcg by mouth daily before breakfast.   Yes Historical Provider, MD   BP 125/74  Pulse 65  Temp(Src) 99.3 F (37.4 C) (Oral)  Resp 15  SpO2 100%  LMP 11/27/2013 Physical Exam  Nursing note and vitals reviewed. Constitutional: She is oriented to person, place, and time. She appears well-developed and well-nourished. No distress.  HENT:  Head: Normocephalic and atraumatic.  Mouth/Throat: Oropharynx is clear and moist. No oropharyngeal exudate.  Eyes: EOM are normal. Pupils are equal, round, and reactive  to light.  Neck: Normal range of motion. Neck supple.  Cardiovascular: Normal rate and regular rhythm.  Exam reveals no gallop and no friction rub.   No murmur heard. Pulmonary/Chest: Effort normal and breath sounds normal. No respiratory distress. She has no wheezes. She has no rales. She exhibits tenderness (chest tenderness is completely reproduced with palpation over the left lateral  chest and left lateral breast.).  Abdominal: Soft. Bowel sounds are normal. She exhibits no distension and no mass. There is no tenderness. There is no rebound and no guarding.  Musculoskeletal: Normal range of motion. She exhibits tenderness. She exhibits no edema.  Tenderness to palpation over the left tricep and left deltoid. Patient also has tenderness to palpation of the left spinal cervical muscles and rhomboid. There is no midline tenderness in the thoracic lumbar region.  Neurological: She is alert and oriented to person, place, and time.  Patient is alert and oriented x3 with clear, goal oriented speech. Patient has 5/5 motor in all extremities. Sensation is intact to light touch. Bilateral finger-to-nose is normal with no signs of dysmetria.   Skin: Skin is warm and dry. No rash noted. No erythema.  Psychiatric: She has a normal mood and affect. Her behavior is normal.    ED Course  Procedures (including critical care time) Labs Review Labs Reviewed  CBC - Abnormal; Notable for the following:    Hemoglobin 11.4 (*)    HCT 34.5 (*)    MCV 71.4 (*)    MCH 23.6 (*)    RDW 19.3 (*)    All other components within normal limits  BASIC METABOLIC PANEL - Abnormal; Notable for the following:    Glucose, Bld 234 (*)    GFR calc non Af Amer 76 (*)    GFR calc Af Amer 88 (*)    All other components within normal limits  PRO B NATRIURETIC PEPTIDE - Abnormal; Notable for the following:    Pro B Natriuretic peptide (BNP) 238.9 (*)    All other components within normal limits  TROPONIN I    Imaging Review Dg Chest 2 View  12/12/2013   CLINICAL DATA:  Headache.  Initial encounter.  EXAM: CHEST  2 VIEW  COMPARISON:  01/06/2013  FINDINGS: Heart size and upper mediastinal width accentuated by low lung volumes and rightward rotation. There is no change from previous study. There is no edema, consolidation, effusion, or pneumothorax. Intact bony thorax.  IMPRESSION: No active cardiopulmonary  disease.   Electronically Signed   By: Jorje Guild M.D.   On: 12/12/2013 00:46     EKG Interpretation None      Date: 12/12/2013  Rate: 79  Rhythm: normal sinus rhythm  QRS Axis: normal  Intervals: normal  ST/T Wave abnormalities: normal  Conduction Disutrbances:right bundle branch block  Narrative Interpretation:   Old EKG Reviewed: unchanged   MDM   Final diagnoses:  None    Patient with gradual onset left arm pain progressing to the left chest, shoulder and neck areas. The symptoms are completely reproduced with palpation. The pain is worse with movement. This is consistent with musculoskeletal pain. EKG is unchanged. Troponin is normal. Chest x-ray without any acute findings. I have low suspicion for DVT. We'll treat patient symptomatically and reassess.  Patient's blood glucose is mildly elevated. She states she drank juice and a few grapes before arrival. She is advised that she'll need to follow up with her primary Dr.  Symptoms now improved. Neurologic exam remains stable.  Return precautions given.  Julianne Rice, MD 12/12/13 [phone]

## 2013-12-12 NOTE — Discharge Instructions (Signed)
Chest Wall Pain Chest wall pain is pain in or around the bones and muscles of your chest. It may take up to 6 weeks to get better. It may take longer if you must stay physically active in your work and activities.  CAUSES  Chest wall pain may happen on its own. However, it may be caused by:  A viral illness like the flu.  Injury.  Coughing.  Exercise.  Arthritis.  Fibromyalgia.  Shingles. HOME CARE INSTRUCTIONS   Avoid overtiring physical activity. Try not to strain or perform activities that cause pain. This includes any activities using your chest or your abdominal and side muscles, especially if heavy weights are used.  Put ice on the sore area.  Put ice in a plastic bag.  Place a towel between your skin and the bag.  Leave the ice on for 15-20 minutes per hour while awake for the first 2 days.  Only take over-the-counter or prescription medicines for pain, discomfort, or fever as directed by your caregiver. SEEK IMMEDIATE MEDICAL CARE IF:   Your pain increases, or you are very uncomfortable.  You have a fever.  Your chest pain becomes worse.  You have new, unexplained symptoms.  You have nausea or vomiting.  You feel sweaty or lightheaded.  You have a cough with phlegm (sputum), or you cough up blood. MAKE SURE YOU:   Understand these instructions.  Will watch your condition.  Will get help right away if you are not doing well or get worse. Document Released: 03/10/2005 Document Revised: 06/02/2011 Document Reviewed: 11/04/2010 [facility] Patient Information 2015 [place], [place]. This information is not intended to replace advice given to you by your health care provider. Make sure you discuss any questions you have with your health care provider.  Muscle Strain A muscle strain is an injury that occurs when a muscle is stretched beyond its normal length. Usually a small number of muscle fibers are torn when this happens. Muscle strain is rated in degrees.  First-degree strains have the least amount of muscle fiber tearing and pain. Second-degree and third-degree strains have increasingly more tearing and pain.  Usually, recovery from muscle strain takes 1-2 weeks. Complete healing takes 5-6 weeks.  CAUSES  Muscle strain happens when a sudden, violent force placed on a muscle stretches it too far. This may occur with lifting, sports, or a fall.  RISK FACTORS Muscle strain is especially common in athletes.  SIGNS AND SYMPTOMS At the site of the muscle strain, there may be:  Pain.  Bruising.  Swelling.  Difficulty using the muscle due to pain or lack of normal function. DIAGNOSIS  Your health care provider will perform a physical exam and ask about your medical history. TREATMENT  Often, the best treatment for a muscle strain is resting, icing, and applying cold compresses to the injured area.  HOME CARE INSTRUCTIONS   Use the PRICE method of treatment to promote muscle healing during the first 2-3 days after your injury. The PRICE method involves:  Protecting the muscle from being injured again.  Restricting your activity and resting the injured body part.  Icing your injury. To do this, put ice in a plastic bag. Place a towel between your skin and the bag. Then, apply the ice and leave it on from 15-20 minutes each hour. After the third day, switch to moist heat packs.  Apply compression to the injured area with a splint or elastic bandage. Be careful not to wrap it too tightly. This  may interfere with blood circulation or increase swelling.  Elevate the injured body part above the level of your heart as often as you can.  Only take over-the-counter or prescription medicines for pain, discomfort, or fever as directed by your health care provider.  Warming up prior to exercise helps to prevent future muscle strains. SEEK MEDICAL CARE IF:   You have increasing pain or swelling in the injured area.  You have numbness, tingling, or  a significant loss of strength in the injured area. MAKE SURE YOU:   Understand these instructions.  Will watch your condition.  Will get help right away if you are not doing well or get worse. Document Released: 03/10/2005 Document Revised: 12/29/2012 Document Reviewed: 10/07/2012 [facility] Patient Information 2015 [place], [place]. This information is not intended to replace advice given to you by your health care provider. Make sure you discuss any questions you have with your health care provider.  Tension Headache A tension headache is a feeling of pain, pressure, or aching often felt over the front and sides of the head. The pain can be dull or can feel tight (constricting). It is the most common type of headache. Tension headaches are not normally associated with nausea or vomiting and do not get worse with physical activity. Tension headaches can last 30 minutes to several days.  CAUSES  The exact cause is not known, but it may be caused by chemicals and hormones in the brain that lead to pain. Tension headaches often begin after stress, anxiety, or depression. Other triggers may include:  Alcohol.  Caffeine (too much or withdrawal).  Respiratory infections (colds, flu, sinus infections).  Dental problems or teeth clenching.  Fatigue.  Holding your head and neck in one position too long while using a computer. SYMPTOMS   Pressure around the head.   Dull, aching head pain.   Pain felt over the front and sides of the head.   Tenderness in the muscles of the head, neck, and shoulders. DIAGNOSIS  A tension headache is often diagnosed based on:   Symptoms.   Physical examination.   A CT scan or MRI of your head. These tests may be ordered if symptoms are severe or unusual. TREATMENT  Medicines may be given to help relieve symptoms.  HOME CARE INSTRUCTIONS   Only take over-the-counter or prescription medicines for pain or discomfort as directed by your caregiver.    Lie down in a dark, quiet room when you have a headache.   Keep a journal to find out what may be triggering your headaches. For example, write down:  What you eat and drink.  How much sleep you get.  Any change to your diet or medicines.  Try massage or other relaxation techniques.   Ice packs or heat applied to the head and neck can be used. Use these 3 to 4 times per day for 15 to 20 minutes each time, or as needed.   Limit stress.   Sit up straight, and do not tense your muscles.   Quit smoking if you smoke.  Limit alcohol use.  Decrease the amount of caffeine you drink, or stop drinking caffeine.  Eat and exercise regularly.  Get 7 to 9 hours of sleep, or as recommended by your caregiver.  Avoid excessive use of pain medicine as recurrent headaches can occur.  SEEK MEDICAL CARE IF:   You have problems with the medicines you were prescribed.  Your medicines do not work.  You have a change  from the usual headache.  You have nausea or vomiting. SEEK IMMEDIATE MEDICAL CARE IF:   Your headache becomes severe.  You have a fever.  You have a stiff neck.  You have loss of vision.  You have muscular weakness or loss of muscle control.  You lose your balance or have trouble walking.  You feel faint or pass out.  You have severe symptoms that are different from your first symptoms. MAKE SURE YOU:   Understand these instructions.  Will watch your condition.  Will get help right away if you are not doing well or get worse. Document Released: 03/10/2005 Document Revised: 06/02/2011 Document Reviewed: 02/28/2011 [facility] Patient Information 2015 [place], [place]. This information is not intended to replace advice given to you by your health care provider. Make sure you discuss any questions you have with your health care provider.

## 2013-12-13 ENCOUNTER — Inpatient Hospital Stay: Payer: 59 | Admitting: Family Medicine

## 2013-12-22 ENCOUNTER — Encounter: Payer: Self-pay | Admitting: Family Medicine

## 2013-12-22 ENCOUNTER — Ambulatory Visit (INDEPENDENT_AMBULATORY_CARE_PROVIDER_SITE_OTHER): Payer: 59 | Admitting: Family Medicine

## 2013-12-22 VITALS — BP 140/96 | HR 72 | Ht 68.0 in | Wt 288.0 lb

## 2013-12-22 DIAGNOSIS — R0789 Other chest pain: Secondary | ICD-10-CM

## 2013-12-22 DIAGNOSIS — E786 Lipoprotein deficiency: Secondary | ICD-10-CM

## 2013-12-22 DIAGNOSIS — M940 Chondrocostal junction syndrome [Tietze]: Secondary | ICD-10-CM

## 2013-12-22 DIAGNOSIS — D509 Iron deficiency anemia, unspecified: Secondary | ICD-10-CM

## 2013-12-22 DIAGNOSIS — R739 Hyperglycemia, unspecified: Secondary | ICD-10-CM

## 2013-12-22 DIAGNOSIS — R7309 Other abnormal glucose: Secondary | ICD-10-CM

## 2013-12-22 DIAGNOSIS — E039 Hypothyroidism, unspecified: Secondary | ICD-10-CM

## 2013-12-22 LAB — HEMOGLOBIN A1C
HEMOGLOBIN A1C: 7.9 % — AB (ref <5.7)
MEAN PLASMA GLUCOSE: 180 mg/dL — AB (ref <117)

## 2013-12-22 NOTE — Patient Instructions (Signed)
Costochondritis Costochondritis, sometimes called Tietze syndrome, is a swelling and irritation (inflammation) of the tissue (cartilage) that connects your ribs with your breastbone (sternum). It causes pain in the chest and rib area. Costochondritis usually goes away on its own over time. It can take up to 6 weeks or longer to get better, especially if you are unable to limit your activities. CAUSES  Some cases of costochondritis have no known cause. Possible causes include:  Injury (trauma).  Exercise or activity such as lifting.  Severe coughing. SIGNS AND SYMPTOMS  Pain and tenderness in the chest and rib area.  Pain that gets worse when coughing or taking deep breaths.  Pain that gets worse with specific movements. DIAGNOSIS  Your health care provider will do a physical exam and ask about your symptoms. Chest X-rays or other tests may be done to rule out other problems. TREATMENT  Costochondritis usually goes away on its own over time. Your health care provider may prescribe medicine to help relieve pain. HOME CARE INSTRUCTIONS   Avoid exhausting physical activity. Try not to strain your ribs during normal activity. This would include any activities using chest, abdominal, and side muscles, especially if heavy weights are used.  Apply ice to the affected area for the first 2 days after the pain begins.  Put ice in a plastic bag.  Place a towel between your skin and the bag.  Leave the ice on for 20 minutes, 2-3 times a day.  Only take over-the-counter or prescription medicines as directed by your health care provider. SEEK MEDICAL CARE IF:  You have redness or swelling at the rib joints. These are signs of infection.  Your pain does not go away despite rest or medicine. SEEK IMMEDIATE MEDICAL CARE IF:   Your pain increases or you are very uncomfortable.  You have shortness of breath or difficulty breathing.  You cough up blood.  You have worse chest pains,  sweating, or vomiting.  You have a fever or persistent symptoms for more than 2-3 days.  You have a fever and your symptoms suddenly get worse. MAKE SURE YOU:   Understand these instructions.  Will watch your condition.  Will get help right away if you are not doing well or get worse. Document Released: 12/18/2004 Document Revised: 12/29/2012 Document Reviewed: 10/12/2012 [facility] Patient Information 2015 [place], [place]. This information is not intended to replace advice given to you by your health care provider. Make sure you discuss any questions you have with your health care provider.   Heat, stretches and massage to the muscles in your neck and shoulder.  Resume taking Celebrex once daily, with food. Take it daily until your pain has completely resolved (1-2 weeks).

## 2013-12-22 NOTE — Progress Notes (Signed)
Chief Complaint  Patient presents with  . Tingling    felt she was having "brainfreeze" on the left side of her head, left arm was tingling, was having chest pain when she took a deep breath. Seen at ED 12/12/13. Pain currently is down the back of left arm and left side of chest. Would llike cardiology referral. And mentions that her blood sugar was over 200 and would like to have checked today, fasting. Declines flu vaccine.    9/21 she was seen in ER with 2 days of increasing left sided headache (like "brain freeze").  It actually started as pain down her left arm (back of her arm to her elbow).  Then pain radiated up to her shoulder, then the headache started, and then she developed left sided chest pain. Chest was very tender to touch.  ER exam noted tenderness over triceps, deltoid, paraspinous muscles in neck and rhomboid muscles, as well as tenderness on palpation of chest.  Labs done included CBC (mild anemia) and b-met was notable for glucose of 234.  Slightly elevated BNP.  Normal CXR, EKG, troponin.  She took 400 mg of ibuprofen when she got home from the ER.  Hasn't taken any additional pain meds or anti-inflammatories ("I'm not a pill person"). Robaxin was also prescribed-- helped a little; no longer taking.  One of her best friends died suddenly the following day (massive MI).  Hypothyroidism--third refill was a generic, others had been brand Synthroid.  Last TSH was in March--due to recheck in July, but she never came. She has gained 16 pounds since her last visit here in January.  She reports she lost 2 pounds since she recently restarted exercising over the last week. (training with her husband--punching, walking, cardio).  She is having to take some breaks from exercising--still feels a little sore, fatiguing a little easier than in the past. Sometimes (rare) she has some trouble swallowing, rarely has noted a tingling pain in her throat, and sees a streak of blood (like if she coughs  up mucus).  Having some trouble sleeping, staying up until 2-3 in the morning over the last few weeks.  Denies palpitations. No shortness of breath. Denies nausea, vomiting, abdominal pain, diarrhea. Denies bleeding, bruising, hair loss.  A few things pop up on her skin (red dots)  Past Medical History  Diagnosis Date  . Hypothyroidism 1993  . 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 Multiple thyroid nodules   . Abnormal Pap smear early 2000's    had colposcopy, no additional treatment. normal paps since   Past Surgical History  Procedure Laterality Date  . Dilitation & currettage/hystroscopy with novasure ablation N/A 03/02/2013    Procedure: DILATATION & CURETTAGE/HYSTEROSCOPY WITH NOVASURE ABLATION;  Surgeon: Olga Millers, MD;  Location: [facility];  Service: Gynecology;  Laterality: N/A;   History   Social History  . Marital Status: Married    Spouse Name: N/A    Number of Children: 3  . Years of Education: N/A   Occupational History  . 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 Smoking status: Never Smoker   . Smokeless tobacco: Never Used  . Alcohol Use: Yes     Comment: one drink every three months.  . Drug Use: No  . Sexual Activity: Yes    Partners: Male    Birth Control/ Protection: None   Other Topics Concern  .  Not on file   Social History Narrative   Lives with husband, and 44 yo daughter.  Son is a police office in [place], has 1 grandchild.  Daughter is a Presenter, broadcasting in [place].  She moved here in 2005 from [place] Prescriptions as of 12/22/2013  Medication Sig Note  . levothyroxine (SYNTHROID, LEVOTHROID) 175 MCG tablet Take 175 mcg by mouth daily before breakfast.   . methocarbamol (ROBAXIN) 500 MG tablet Take 2 tablets (1,000 mg total) by mouth 2 (two) times daily. 12/22/2013: Prescribed in ER. Took a few, no longer taking   Allergies  Allergen  Reactions  . Advil [Ibuprofen] Shortness Of Breath and Swelling    And chest pain.  Tolerates naproxen without problems  . Betadine [Povidone Iodine]     Sob, swelling  . Iohexol      Desc: Pt states [place] 5 [place] and experienced severe sob and swelling. We did her scan w/o iv contrast today.        Thanks., Onset Date: [id number]    ROS:  Denies fevers, chills, URI symptoms, palpitations.  +chest pain/tenderness.  No nausea, vomiting, abdominal pain, bleeding, bruising, rash.  Denies low back pain.  No cough, shortness of breath.  See HPI.  PHYSICAL EXAM: BP 140/96  Pulse 72  Ht $R'5\' 8"'lp$  (1.727 m)  Wt 288 lb (130.636 kg)  BMI 43.80 kg/m2  LMP 11/27/2013 Pleasant, obese female in no distress HEENT:  PERRL, EOMI, conjunctiva clear.  Nasal mucosa--the left side is moderately edematous, with minimal erythema, or purulence. Sinuses are nontender.  OP is normal. Neck: Enlarged thyroid, no dominant mass. No carotid bruit Neck: c-spine is nontender.  Very tender at left paraspinous muscles in neck, and left trapezius muscle. nontender at rhomboids. Chest:  Tender at two levels of costochondral junctions on the left. nontender over pectoralis muscles.  Tender at left mid-lateral clavicle. Abdomen: soft, nontender  ER results reviewed.  ASSESSMENT/PLAN:  Hypothyroidism, unspecified hypothyroidism type - past due for check.  +weight gain.  Inadvertantly switched from brand to generic - Plan: TSH  Elevated blood sugar - given weight gain, concerned for possible diabetes - Plan: Hemoglobin A1c, Glucose, random  Other chest pain - c/w L sided costochondritis - Plan: Lipid panel  Low HDL (under 40) - daily exercise encouraged - Plan: Lipid panel  Costochondritis  Iron deficiency anemia - improved s/p D&C  Chest wall pain is musculoskeletal, with a clear component of costochondritis.  Has muscular etiology of pain in her neck.  Still has some celebrex samples at  home.  Tolerated in the past.  TSH, A1c, glucose  Refuses flu shot  She is requesting referral to cardiologist.  She understands that her chest pain is noncardiac, but given the sudden loss of her 46 yo riend, she wants to see one regardless.  We discussed that she doesn't likely have any significant blockages, but may have significant risk factors, which we will be addressing--checking for diabetes, checking lipids, will need to f/u on her blood pressure which was elevated today. Encouraged to get daily exercise, and continue weight loss.

## 2013-12-23 ENCOUNTER — Encounter: Payer: Self-pay | Admitting: Family Medicine

## 2013-12-23 DIAGNOSIS — E119 Type 2 diabetes mellitus without complications: Secondary | ICD-10-CM | POA: Insufficient documentation

## 2013-12-23 LAB — LIPID PANEL
Cholesterol: 182 mg/dL (ref 0–200)
HDL: 38 mg/dL — ABNORMAL LOW (ref >39)
LDL Cholesterol: 119 mg/dL — ABNORMAL HIGH (ref 0–99)
Total CHOL/HDL Ratio: 4.8 Ratio
Triglycerides: 127 mg/dL (ref <150)
VLDL: 25 mg/dL (ref 0–40)

## 2013-12-23 LAB — GLUCOSE, RANDOM: Glucose, Bld: 132 mg/dL — ABNORMAL HIGH (ref 70–99)

## 2013-12-23 LAB — TSH: TSH: 3.081 u[IU]/mL (ref 0.350–4.500)

## 2013-12-26 ENCOUNTER — Ambulatory Visit: Payer: Self-pay | Admitting: Family Medicine

## 2013-12-29 ENCOUNTER — Ambulatory Visit (INDEPENDENT_AMBULATORY_CARE_PROVIDER_SITE_OTHER): Payer: 59 | Admitting: Family Medicine

## 2013-12-29 ENCOUNTER — Other Ambulatory Visit: Payer: Self-pay

## 2013-12-29 ENCOUNTER — Encounter: Payer: Self-pay | Admitting: Family Medicine

## 2013-12-29 VITALS — BP 140/80 | Ht 68.0 in | Wt 288.0 lb

## 2013-12-29 DIAGNOSIS — E039 Hypothyroidism, unspecified: Secondary | ICD-10-CM

## 2013-12-29 DIAGNOSIS — E785 Hyperlipidemia, unspecified: Secondary | ICD-10-CM

## 2013-12-29 DIAGNOSIS — E1169 Type 2 diabetes mellitus with other specified complication: Secondary | ICD-10-CM

## 2013-12-29 DIAGNOSIS — E119 Type 2 diabetes mellitus without complications: Secondary | ICD-10-CM

## 2013-12-29 MED ORDER — ONETOUCH DELICA LANCETS FINE MISC: 1.0000 | Freq: Two times a day (BID) | Status: DC

## 2013-12-29 MED ORDER — GLUCOSE BLOOD VI STRP: ORAL_STRIP | Status: DC

## 2013-12-29 MED ORDER — METFORMIN HCL ER 500 MG PO TB24: ORAL_TABLET | ORAL | Status: DC

## 2013-12-29 NOTE — Progress Notes (Signed)
Chief Complaint  Patient presents with  . Advice Only    NEW DIABETIC   Patient presents to discuss her new diagnosis of diabetes. Since learning of her lab results, she has been doing a lot of reading.    We reviewed her typical diet: Gatorade (occasional, to help with energy). She drinks a lot of orange juice. Breakfast--oatmeal (homemade, no sugar) or a fruit smoothie. Snacks--Banana, mandarin oranges 2/day, snacks on dried cranberries at work, mints She drinks a lot of water Lunch: Grilled chicken salad, or chicken wrap for lunch. Only sweet potato fries (no regular fries). Doesn't eat other sides or chips Dinner: rice (sometimes white, sometimes brown, long grain)/beans, baked chicken Admits that her weakness is pasta. Macaroni pie and other high carb foods  PHYSICAL EXAM: BP 140/80  Ht 5\' 8"  (1.727 m)  Wt 288 lb (130.636 kg)  BMI 43.80 kg/m2  LMP 11/27/2013 Well developed pleasant female in no distress. Normal mood, affect, hygiene and grooming Alert and oriented. Normal gait, cranial nerves.  Lab Results  Component Value Date   HGBA1C 7.9* 12/22/2013   Fasting glucose 132  (A1c back in 12/2012 was 6.5, 5.7 2 years ago)  Lab Results  Component Value Date   TSH 3.081 12/22/2013   Lab Results  Component Value Date   CHOL 182 12/22/2013   HDL 38* 12/22/2013   LDLCALC 119* 12/22/2013   TRIG 127 12/22/2013   CHOLHDL 4.8 12/22/2013   ASSESSMENT/PLAN:  New onset type 2 diabetes mellitus - Plan: Amb Referral to Nutrition and Diabetic E, metFORMIN (GLUCOPHAGE-XR) 500 MG 24 hr tablet  Hyperlipidemia associated with type 2 diabetes mellitus - Plan: Amb Referral to Nutrition and Diabetic E  Obesity, morbid, BMI 40.0-49.9  Hypothyroidism, unspecified hypothyroidism type  Obesity, Class III, BMI 40-49.9 (morbid obesity)   40 minute visit spent counseling on diabetes--pathophysiology, complications, treatments.  Reviewed proper diet (cut back on juices, fruits, carbs),  exercise, weight loss.  She was given glucometer and shown how to use. Advised when to check sugars, how to keep log.  Start Metformin--risks and side effects reviewed.  We also reviewed her last lipids, and goals.  Reviewed low cholesterol diet, and the possibility for meds in future if not at goal (LDL<100).  All questions were answered.  Referring to DM education. F/u 1 month with list of sugars.  Fasting med check in 3 mos.     Start metformin 1 tablet before breakfast daily.  After a week, if you aren't having severe side effects, increase it to two tablets every morning.  If your blood sugars are >140 in the morning, or 160 in the evening, then increase to 3 tablets every morning.  Check your blood sugars every morning (fasting, before eating).  Also check sugars 2 hours after dinner (or bedtime), occasionally before dinner instead of after.  Keep a log, with the date, morning/evening/comments--put reasons your sugars might be out of range in the comments (ie--ate a cookie, forgot to take med, "sick")  We are referring you to a dietician for diabetes education.  Return in 1 month and bring your list of sugars.  We will plan on rechecking A1c and your cholesterol at a 3 month visit.

## 2013-12-29 NOTE — Patient Instructions (Signed)
Start metformin 1 tablet before breakfast daily.  After a week, if you aren't having severe side effects, increase it to two tablets every morning.  If your blood sugars are >140 in the morning, or 160 in the evening, then increase to 3 tablets every morning.  Check your blood sugars every morning (fasting, before eating).  Also check sugars 2 hours after dinner (or bedtime), occasionally before dinner instead of after.  Keep a log, with the date, morning/evening/comments--put reasons your sugars might be out of range in the comments (ie--ate a cookie, forgot to take med, "sick")  We are referring you to a dietician for diabetes education.   Return in 1 month and bring your list of sugars.  We will plan on rechecking A1c and your cholesterol at a 3 month visit.  Type 2 Diabetes Mellitus Type 2 diabetes mellitus, often simply referred to as type 2 diabetes, is a long-lasting (chronic) disease. In type 2 diabetes, the pancreas does not make enough insulin (a hormone), the cells are less responsive to the insulin that is made (insulin resistance), or both. Normally, insulin moves sugars from food into the tissue cells. The tissue cells use the sugars for energy. The lack of insulin or the lack of normal response to insulin causes excess sugars to build up in the blood instead of going into the tissue cells. As a result, high blood sugar (hyperglycemia) develops. The effect of high sugar (glucose) levels can cause many complications. Type 2 diabetes was also previously called adult-onset diabetes, but it can occur at any age.  RISK FACTORS  A person is predisposed to developing type 2 diabetes if someone in the family has the disease and also has one or more of the following primary risk factors:  Overweight.  An inactive lifestyle.  A history of consistently eating high-calorie foods. Maintaining a normal weight and regular physical activity can reduce the chance of developing type 2  diabetes. SYMPTOMS  A person with type 2 diabetes may not show symptoms initially. The symptoms of type 2 diabetes appear slowly. The symptoms include:  Increased thirst (polydipsia).  Increased urination (polyuria).  Increased urination during the night (nocturia).  Weight loss. This weight loss may be rapid.  Frequent, recurring infections.  Tiredness (fatigue).  Weakness.  Vision changes, such as blurred vision.  Fruity smell to your breath.  Abdominal pain.  Nausea or vomiting.  Cuts or bruises which are slow to heal.  Tingling or numbness in the hands or feet. DIAGNOSIS Type 2 diabetes is frequently not diagnosed until complications of diabetes are present. Type 2 diabetes is diagnosed when symptoms or complications are present and when blood glucose levels are increased. Your blood glucose level may be checked by one or more of the following blood tests:  A fasting blood glucose test. You will not be allowed to eat for at least 8 hours before a blood sample is taken.  A random blood glucose test. Your blood glucose is checked at any time of the day regardless of when you ate.  A hemoglobin A1c blood glucose test. A hemoglobin A1c test provides information about blood glucose control over the previous 3 months.  An oral glucose tolerance test (OGTT). Your blood glucose is measured after you have not eaten (fasted) for 2 hours and then after you drink a glucose-containing beverage. TREATMENT   You may need to take insulin or diabetes medicine daily to keep blood glucose levels in the desired range.  If you use  insulin, you may need to adjust the dosage depending on the carbohydrates that you eat with each meal or snack. The treatment goal is to maintain the before meal blood sugar (preprandial glucose) level at 70-130 mg/dL. HOME CARE INSTRUCTIONS   Have your hemoglobin A1c level checked twice a year.  Perform daily blood glucose monitoring as directed by your  health care provider.  Monitor urine ketones when you are ill and as directed by your health care provider.  Take your diabetes medicine or insulin as directed by your health care provider to maintain your blood glucose levels in the desired range.  Never run out of diabetes medicine or insulin. It is needed every day.  If you are using insulin, you may need to adjust the amount of insulin given based on your intake of carbohydrates. Carbohydrates can raise blood glucose levels but need to be included in your diet. Carbohydrates provide vitamins, minerals, and fiber which are an essential part of a healthy diet. Carbohydrates are found in fruits, vegetables, whole grains, dairy products, legumes, and foods containing added sugars.  Eat healthy foods. You should make an appointment to see a registered dietitian to help you create an eating plan that is right for you.  Lose weight if you are overweight.  Carry a medical alert card or wear your medical alert jewelry.  Carry a 15-gram carbohydrate snack with you at all times to treat low blood glucose (hypoglycemia). Some examples of 15-gram carbohydrate snacks include:  Glucose tablets, 3 or 4.  Glucose gel, 15-gram tube.  Raisins, 2 tablespoons (24 grams).  Jelly beans, 6.  Animal crackers, 8.  Regular pop, 4 ounces (120 mL).  Gummy treats, 9.  Recognize hypoglycemia. Hypoglycemia occurs with blood glucose levels of 70 mg/dL and below. The risk for hypoglycemia increases when fasting or skipping meals, during or after intense exercise, and during sleep. Hypoglycemia symptoms can include:  Tremors or shakes.  Decreased ability to concentrate.  Sweating.  Increased heart rate.  Headache.  Dry mouth.  Hunger.  Irritability.  Anxiety.  Restless sleep.  Altered speech or coordination.  Confusion.  Treat hypoglycemia promptly. If you are alert and able to safely swallow, follow the 15:15 rule:  Take 15-20 grams of  rapid-acting glucose or carbohydrate. Rapid-acting options include glucose gel, glucose tablets, or 4 ounces (120 mL) of fruit juice, regular soda, or low-fat milk.  Check your blood glucose level 15 minutes after taking the glucose.  Take 15-20 grams more of glucose if the repeat blood glucose level is still 70 mg/dL or below.  Eat a meal or snack within 1 hour once blood glucose levels return to normal.  Be alert to feeling very thirsty and urinating more frequently than usual, which are early signs of hyperglycemia. An early awareness of hyperglycemia allows for prompt treatment. Treat hyperglycemia as directed by your health care provider.  Engage in at least 150 minutes of moderate-intensity physical activity a week, spread over at least 3 days of the week or as directed by your health care provider. In addition, you should engage in resistance exercise at least 2 times a week or as directed by your health care provider. Try to spend no more than 90 minutes at one time inactive.  Adjust your medicine and food intake as needed if you start a new exercise or sport.  Follow your sick-day plan anytime you are unable to eat or drink as usual.  Do not use any tobacco products including cigarettes, chewing  tobacco, or electronic cigarettes. If you need help quitting, ask your health care provider.  Limit alcohol intake to no more than 1 drink per day for nonpregnant women and 2 drinks per day for men. You should drink alcohol only when you are also eating food. Talk with your health care provider whether alcohol is safe for you. Tell your health care provider if you drink alcohol several times a week.  Keep all follow-up visits as directed by your health care provider. This is important.  Schedule an eye exam soon after the diagnosis of type 2 diabetes and then annually.  Perform daily skin and foot care. Examine your skin and feet daily for cuts, bruises, redness, nail problems, bleeding,  blisters, or sores. A foot exam by a health care provider should be done annually.  Brush your teeth and gums at least twice a day and floss at least once a day. Follow up with your dentist regularly.  Share your diabetes management plan with your workplace or school.  Stay up-to-date with immunizations. It is recommended that people with diabetes who are over 13 years old get the pneumonia vaccine. In some cases, two separate shots may be given. Ask your health care provider if your pneumonia vaccination is up-to-date.  Learn to manage stress.  Obtain ongoing diabetes education and support as needed.  Participate in or seek rehabilitation as needed to maintain or improve independence and quality of life. Request a physical or occupational therapy referral if you are having foot or hand numbness, or difficulties with grooming, dressing, eating, or physical activity. SEEK MEDICAL CARE IF:   You are unable to eat food or drink fluids for more than 6 hours.  You have nausea and vomiting for more than 6 hours.  Your blood glucose level is over 240 mg/dL.  There is a change in mental status.  You develop an additional serious illness.  You have diarrhea for more than 6 hours.  You have been sick or have had a fever for a couple of days and are not getting better.  You have pain during any physical activity.  SEEK IMMEDIATE MEDICAL CARE IF:  You have difficulty breathing.  You have moderate to large ketone levels. MAKE SURE YOU:  Understand these instructions.  Will watch your condition.  Will get help right away if you are not doing well or get worse. Document Released: 03/10/2005 Document Revised: 07/25/2013 Document Reviewed: 10/07/2011 [facility] Patient Information 2015 [place], [place]. This information is not intended to replace advice given to you by your health care provider. Make sure you discuss any questions you have with your health care provider.   Fat and  Cholesterol Control Diet Fat and cholesterol levels in your blood and organs are influenced by your diet. High levels of fat and cholesterol may lead to diseases of the heart, small and large blood vessels, gallbladder, liver, and pancreas. CONTROLLING FAT AND CHOLESTEROL WITH DIET Although exercise and lifestyle factors are important, your diet is key. That is because certain foods are known to raise cholesterol and others to lower it. The goal is to balance foods for their effect on cholesterol and more importantly, to replace saturated and trans fat with other types of fat, such as monounsaturated fat, polyunsaturated fat, and omega-3 fatty acids. On average, a person should consume no more than 15 to 17 g of saturated fat daily. Saturated and trans fats are considered "bad" fats, and they will raise LDL cholesterol. Saturated fats are primarily found  in animal products such as meats, butter, and cream. However, that does not mean you need to give up all your favorite foods. Today, there are good tasting, low-fat, low-cholesterol substitutes for most of the things you like to eat. Choose low-fat or nonfat alternatives. Choose round or loin cuts of red meat. These types of cuts are lowest in fat and cholesterol. Chicken (without the skin), fish, veal, and ground [place] breast are great choices. Eliminate fatty meats, such as hot dogs and salami. Even shellfish have little or no saturated fat. Have a 3 oz (85 g) portion when you eat lean meat, poultry, or fish. Trans fats are also called "partially hydrogenated oils." They are oils that have been scientifically manipulated so that they are solid at room temperature resulting in a longer shelf life and improved taste and texture of foods in which they are added. Trans fats are found in stick margarine, some tub margarines, cookies, crackers, and baked goods.  When baking and cooking, oils are a great substitute for butter. The monounsaturated oils are  especially beneficial since it is believed they lower LDL and raise HDL. The oils you should avoid entirely are saturated tropical oils, such as coconut and palm.  Remember to eat a lot from food groups that are naturally free of saturated and trans fat, including fish, fruit, vegetables, beans, grains (barley, rice, couscous, bulgur wheat), and pasta (without cream sauces).  IDENTIFYING FOODS THAT LOWER FAT AND CHOLESTEROL  Soluble fiber may lower your cholesterol. This type of fiber is found in fruits such as apples, vegetables such as broccoli, potatoes, and carrots, legumes such as beans, peas, and lentils, and grains such as barley. Foods fortified with plant sterols (phytosterol) may also lower cholesterol. You should eat at least 2 g per day of these foods for a cholesterol lowering effect.  Read package labels to identify low-saturated fats, trans fat free, and low-fat foods at the supermarket. Select cheeses that have only 2 to 3 g saturated fat per ounce. Use a heart-healthy tub margarine that is free of trans fats or partially hydrogenated oil. When buying baked goods (cookies, crackers), avoid partially hydrogenated oils. Breads and muffins should be made from whole grains (whole-wheat or whole oat flour, instead of "flour" or "enriched flour"). Buy non-creamy canned soups with reduced salt and no added fats.  FOOD PREPARATION TECHNIQUES  Never deep-fry. If you must fry, either stir-fry, which uses very little fat, or use non-stick cooking sprays. When possible, broil, bake, or roast meats, and steam vegetables. Instead of putting butter or margarine on vegetables, use lemon and herbs, applesauce, and cinnamon (for squash and sweet potatoes). Use nonfat yogurt, salsa, and low-fat dressings for salads.  LOW-SATURATED FAT / LOW-FAT FOOD SUBSTITUTES Meats / Saturated Fat (g)  Avoid: Steak, marbled (3 oz/85 g) / 11 g  Choose: Steak, lean (3 oz/85 g) / 4 g  Avoid: Hamburger (3 oz/85 g) / 7  g  Choose: Hamburger, lean (3 oz/85 g) / 5 g  Avoid: Ham (3 oz/85 g) / 6 g  Choose: Ham, lean cut (3 oz/85 g) / 2.4 g  Avoid: Chicken, with skin, dark meat (3 oz/85 g) / 4 g  Choose: Chicken, skin removed, dark meat (3 oz/85 g) / 2 g  Avoid: Chicken, with skin, light meat (3 oz/85 g) / 2.5 g  Choose: Chicken, skin removed, light meat (3 oz/85 g) / 1 g Dairy / Saturated Fat (g)  Avoid: Whole milk (1 cup) / 5  g  Choose: Low-fat milk, 2% (1 cup) / 3 g  Choose: Low-fat milk, 1% (1 cup) / 1.5 g  Choose: Skim milk (1 cup) / 0.3 g  Avoid: Hard cheese (1 oz/28 g) / 6 g  Choose: Skim milk cheese (1 oz/28 g) / 2 to 3 g  Avoid: Cottage cheese, 4% fat (1 cup) / 6.5 g  Choose: Low-fat cottage cheese, 1% fat (1 cup) / 1.5 g  Avoid: Ice cream (1 cup) / 9 g  Choose: Sherbet (1 cup) / 2.5 g  Choose: Nonfat frozen yogurt (1 cup) / 0.3 g  Choose: Frozen fruit bar / trace  Avoid: Whipped cream (1 tbs) / 3.5 g  Choose: Nondairy whipped topping (1 tbs) / 1 g Condiments / Saturated Fat (g)  Avoid: Mayonnaise (1 tbs) / 2 g  Choose: Low-fat mayonnaise (1 tbs) / 1 g  Avoid: Butter (1 tbs) / 7 g  Choose: Extra light margarine (1 tbs) / 1 g  Avoid: Coconut oil (1 tbs) / 11.8 g  Choose: Olive oil (1 tbs) / 1.8 g  Choose: Corn oil (1 tbs) / 1.7 g  Choose: Safflower oil (1 tbs) / 1.2 g  Choose: Sunflower oil (1 tbs) / 1.4 g  Choose: Soybean oil (1 tbs) / 2.4 g  Choose: Canola oil (1 tbs) / 1 g Document Released: 03/10/2005 Document Revised: 07/05/2012 Document Reviewed: 06/08/2013 [employer] Patient Information 2015 [place], [place]. This information is not intended to replace advice given to you by your health care provider. Make sure you discuss any questions you have with your health care provider.

## 2013-12-30 NOTE — Progress Notes (Signed)
Pt said since she was [facility] opinion at [facility] to [facility] and refer [facility] to [facility]

## 2014-01-23 ENCOUNTER — Encounter: Payer: Self-pay | Admitting: Family Medicine

## 2014-01-25 ENCOUNTER — Encounter: Payer: Self-pay | Admitting: Cardiology

## 2014-01-25 ENCOUNTER — Ambulatory Visit (INDEPENDENT_AMBULATORY_CARE_PROVIDER_SITE_OTHER): Payer: 59 | Admitting: Cardiology

## 2014-01-25 VITALS — BP 124/90 | HR 77 | Ht 68.0 in | Wt 291.0 lb

## 2014-01-25 DIAGNOSIS — E7800 Pure hypercholesterolemia, unspecified: Secondary | ICD-10-CM

## 2014-01-25 DIAGNOSIS — I452 Bifascicular block: Secondary | ICD-10-CM

## 2014-01-25 DIAGNOSIS — N261 Atrophy of kidney (terminal): Secondary | ICD-10-CM

## 2014-01-25 DIAGNOSIS — I10 Essential (primary) hypertension: Secondary | ICD-10-CM

## 2014-01-25 DIAGNOSIS — E039 Hypothyroidism, unspecified: Secondary | ICD-10-CM

## 2014-01-25 DIAGNOSIS — R079 Chest pain, unspecified: Secondary | ICD-10-CM

## 2014-01-25 NOTE — Progress Notes (Signed)
Gail Barr Date of Birth:  October 10, 1965 [facility] [address] [place], [place]  [address] [phone]        Fax   [phone]   History of Present Illness: This pleasant 48 year old woman is seen by me for the first time today.  She is seen at the request of her PCP Dr. Aris Georgia.  The patient has a history ofrecently discovered diabetes.  She has exogenous obesity.  She estimates that in the past 10 years she has gained about 75 pounds.  She has had 2 emergency room visits for left sided chest pain.  She does not have any history of known ischemic heart disease.  Her chest discomfort is in the left anterior chest and radiates up to the left shoulder and down the left arm as far as the elbow.  It also tends to go back toward the left scapula.  The discomfort is intermittent.she has a history of hypercholesterolemia.  She is a nonsmoker.  she has an abnormal electrocardiogram with bifascicular block.  The EKG is unchanged since 01/06/13.  She does not have any dizziness or syncope. Her family history reveals that her mother is living at age 59 and is in good health.   She does not know about her father's health.  Current Outpatient Prescriptions  Medication Sig Dispense Refill  . glucose blood test strip THIS IS FOR  ONE TOUCH VERIO PATIENT IS TO TEST BID Use as instructed 100 each 12  . levothyroxine (SYNTHROID, LEVOTHROID) 175 MCG tablet Take 175 mcg by mouth daily before breakfast.    . metFORMIN (GLUCOPHAGE-XR) 500 MG 24 hr tablet Take once daily before breakfast.  Start at 1 tablet daily, and in a week increase to two.  Increase to 3 tablets daily if sugars still >150 90 tablet 0  . methocarbamol (ROBAXIN) 500 MG tablet Take 2 tablets (1,000 mg total) by mouth 2 (two) times daily. 30 tablet 0  . ONETOUCH DELICA LANCETS FINE MISC 1 each by Does not apply route 2 (two) times daily. 100 each 12   No current facility-administered medications for this visit.     Allergies  Allergen Reactions  . Advil [Ibuprofen] Shortness Of Breath and Swelling    And chest pain.  Tolerates naproxen without problems  . Betadine [Povidone Iodine]     Sob, swelling  . Iohexol      Desc: Pt states [place] 5 [place] and experienced severe sob and swelling. We did her scan w/o iv contrast today.        Thanks., Onset Date: [id number]     Patient Active Problem List   Diagnosis Date Noted  . Chest pain 01/25/2014  . 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 Bifascicular block 01/25/2014  . New onset type 2 diabetes mellitus 12/23/2013  . Costochondritis 01/10/2013  . Abnormal menses 01/10/2013  . Iron deficiency anemia 08/25/2011  . Unspecified vitamin D deficiency 07/08/2011  . Hypothyroidism 07/07/2011  . Obesity, Class III, BMI 40-49.9 (morbid obesity) 07/07/2011    History  Smoking status  . Never Smoker   Smokeless tobacco  . Never Used    History  Alcohol Use  . Yes    Comment: one drink every three months.    Family History  Problem Relation Age of Onset  . Asthma Brother   . Diabetes Maternal Aunt   . Diabetes Maternal Uncle   . Diabetes Maternal Grandmother   .  Cancer Maternal Grandfather     prostate cancer  . Asthma Brother     Review of Systems: Constitutional: no fever chills diaphoresis or fatigue or change in weight.  Head and neck: no hearing loss, no epistaxis, no photophobia or visual disturbance. Respiratory: No cough, shortness of breath or wheezing. Cardiovascular: No chest pain peripheral edema, palpitations. Gastrointestinal: No abdominal distention, no abdominal pain, no change in bowel habits hematochezia or melena. Genitourinary: No dysuria, no frequency, no urgency, no nocturia. Musculoskeletal:No arthralgias, no back pain, no gait disturbance or myalgias. Neurological: No dizziness, no headaches, no numbness, no seizures, no syncope, no weakness, no tremors. Hematologic: No  lymphadenopathy, no easy bruising. Psychiatric: No confusion, no hallucinations, no sleep disturbance.    Physical Exam: Filed Vitals:   01/25/14 1120  BP: 124/90  Pulse: 77  The patient appears to be in no distress.  There is obesity  Head and neck exam reveals that the pupils are equal and reactive.  The extraocular movements are full.  There is no scleral icterus.  Mouth and pharynx are benign.  No lymphadenopathy.  No carotid bruits.  The jugular venous pressure is normal.  Thyroid is not enlarged or tender.  Chest is clear to percussion and auscultation.  No rales or rhonchi.  Expansion of the chest is symmetrical.  Heart reveals no abnormal lift or heave.  First and second heart sounds are normal.  There is no murmur gallop rub or click.  The abdomen is soft and nontender.  Bowel sounds are normoactive.  There is no hepatosplenomegaly or mass.  There are no abdominal bruits.  Extremities reveal no phlebitis or edema.  Pedal pulses are good.  There is no cyanosis or clubbing.  Neurologic exam is normal strength and no lateralizing weakness.  No sensory deficits.  Integument reveals no rash  EKG shows normal sinus rhythm with bifascicular block, unchanged since 01/06/13  Assessment / Plan: 1. Left chest discomfort with left arm radiation rule out ischemic heart disease 2.  Bifascicular block 3.  Essential hypertension 4.  Diabetes mellitus 5.  Hypercholesterolemia 6.  Exogenous obesity  Disposition: We will have the patient return for a 2 day Lexiscan Myoview stress test. The patient will continue to try to lose weight through prudent diet. Many thanks for the opportunity to see this pleasant woman with you.  I will be in touch with you regarding the results of her stress test. Return here when necessary

## 2014-01-25 NOTE — Patient Instructions (Signed)
Your physician recommends that you continue on your current medications as directed. Please refer to the Current Medication list given to you today.  Your physician has requested that you have a lexiscan myoview. For further information please visit [email or web address]. Please follow instruction sheet, as given.  Follow up as needed  

## 2014-02-01 ENCOUNTER — Ambulatory Visit (HOSPITAL_COMMUNITY): Payer: 59 | Attending: Internal Medicine | Admitting: Radiology

## 2014-02-01 DIAGNOSIS — R079 Chest pain, unspecified: Secondary | ICD-10-CM | POA: Diagnosis present

## 2014-02-01 DIAGNOSIS — I10 Essential (primary) hypertension: Secondary | ICD-10-CM | POA: Insufficient documentation

## 2014-02-01 DIAGNOSIS — E119 Type 2 diabetes mellitus without complications: Secondary | ICD-10-CM | POA: Insufficient documentation

## 2014-02-01 DIAGNOSIS — I452 Bifascicular block: Secondary | ICD-10-CM

## 2014-02-01 MED ORDER — TECHNETIUM TC 99M SESTAMIBI GENERIC - CARDIOLITE
30.0000 | Freq: Once | INTRAVENOUS | Status: AC | PRN
  Administered 2014-02-01: 30 via INTRAVENOUS

## 2014-02-01 MED ORDER — REGADENOSON 0.4 MG/5ML IV SOLN
0.4000 mg | Freq: Once | INTRAVENOUS | Status: AC
  Administered 2014-02-01: 0.4 mg via INTRAVENOUS

## 2014-02-01 NOTE — Progress Notes (Signed)
[facility] [address] [place], [place] [address] [phone]    Cardiology Nuclear Med Study  Gail Barr is a 48 y.o. female     MRN : [id number]     DOB: 05-18-1965  Procedure Date: 02/01/2014  Nuclear Med Background Indication for Stress Test:  Evaluation for Ischemia History:  n/a Cardiac Risk Factors: Hypertension and NIDDM  Symptoms:  Chest Pain   Nuclear Pre-Procedure Caffeine/Decaff Intake:  None> 12 hrs NPO After: 8:00pm   Lungs:  clear O2 Sat: 96% on room air. IV 0.9% NS with Angio Cath:  24g  IV Site: R Forearm, tolerated well IV Started by:  Irven Baltimore, RN  Chest Size (in):  42 Cup Size: DD  Height: 5\' 8"  (1.727 m)  Weight:  290 lb (131.543 kg)  BMI:  Body mass index is 44.1 kg/(m^2). Tech Comments:  No medications (Glucophage) this am. Irven Baltimore, RN.    Nuclear Med Study 1 or 2 day study: 2 day  Stress Test Type:  Carlton Adam  Reading MD: N/A  Order Authorizing Provider:  Darlin Coco, MD  Resting Radionuclide: Technetium 60m Sestamibi  Resting Radionuclide Dose: 33.0 mCi on 02/02/14  Stress Radionuclide:  Technetium 30m Sestamibi  Stress Radionuclide Dose: 33.0 mCi on 02/01/14          Stress Protocol Rest HR: 75 Stress HR: 110  Rest BP: 133/91 Stress BP: 131/80  Exercise Time (min): n/a METS: n/a   Predicted Max HR: 174 bpm % Max HR: 63.22 bpm Rate Pressure Product: 14630   Dose of Adenosine (mg):  n/a Dose of Lexiscan: 0.4 mg  Dose of Atropine (mg): n/a Dose of Dobutamine: n/a mcg/kg/min (at max HR)  Stress Test Technologist: Perrin Maltese, EMT-P  Nuclear Technologist:  Earl Many, CNMT     Rest Procedure:  Myocardial perfusion imaging was performed at rest 45 minutes following the intravenous administration of Technetium 51m Sestamibi. Rest ECG: NSR-RBBB , LAD  Stress Procedure:  The patient received IV Lexiscan 0.4 mg over 15-seconds.  Technetium 71m Sestamibi injected at 30-seconds. This  patient had chest burning and nausea with the Lexiscan injection. Quantitative spect images were obtained after a 45 minute delay. Stress ECG: No significant change from baseline ECG  QPS Raw Data Images:  Mild breast attenuation.  Normal left ventricular size. Stress Images:  Normal homogeneous uptake in all areas of the myocardium, except for a tiny area of reduction in apical perfusion, likely representing "apical thinning" or a small attenuation artifact Rest Images:  Comparison with the stress images reveals no significant change. Subtraction (SDS):  No evidence of ischemia. Transient Ischemic Dilatation (Normal <1.22):  1.04 Lung/Heart Ratio (Normal <0.45):  0.32  Quantitative Gated Spect Images QGS EDV:  110 ml QGS ESV:  48 ml  Impression Exercise Capacity:  Lexiscan with low level exercise. BP Response:  Normal blood pressure response. Clinical Symptoms:  No significant symptoms noted. ECG Impression:  No significant ECG changes with Lexiscan. Comparison with Prior Nuclear Study: No previous nuclear study performed  Overall Impression:  Low risk stress nuclear study with a small "apical thinning" artifact.  LV Ejection Fraction: 57%.  LV Wall Motion:  NL LV Function; NL Wall Motion   Sanda Klein, MD, [facility] [phone] office [phone] pager

## 2014-02-02 ENCOUNTER — Ambulatory Visit: Payer: 59 | Admitting: Family Medicine

## 2014-02-02 ENCOUNTER — Other Ambulatory Visit: Payer: Self-pay | Admitting: Obstetrics and Gynecology

## 2014-02-02 ENCOUNTER — Ambulatory Visit (HOSPITAL_COMMUNITY): Payer: 59 | Attending: Cardiology

## 2014-02-02 DIAGNOSIS — R0989 Other specified symptoms and signs involving the circulatory and respiratory systems: Secondary | ICD-10-CM

## 2014-02-02 MED ORDER — TECHNETIUM TC 99M SESTAMIBI GENERIC - CARDIOLITE
30.0000 | Freq: Once | INTRAVENOUS | Status: AC | PRN
  Administered 2014-02-02: 30 via INTRAVENOUS

## 2014-02-03 LAB — CYTOLOGY - PAP

## 2014-02-10 ENCOUNTER — Encounter: Payer: 59 | Attending: Family Medicine | Admitting: *Deleted

## 2014-02-10 ENCOUNTER — Encounter: Payer: Self-pay | Admitting: *Deleted

## 2014-02-10 VITALS — Ht 68.0 in | Wt 290.0 lb

## 2014-02-10 DIAGNOSIS — E119 Type 2 diabetes mellitus without complications: Secondary | ICD-10-CM | POA: Diagnosis present

## 2014-02-10 DIAGNOSIS — Z713 Dietary counseling and surveillance: Secondary | ICD-10-CM | POA: Insufficient documentation

## 2014-02-10 NOTE — Progress Notes (Signed)
Diabetes Self-Management Education  Visit Type:  DSMe  Appt. Start Time: 0900 Appt. End Time: 1030  02/10/2014  [name], identified by name and date of birth, is a 48 y.o. female with a diagnosis of Diabetes: Type 2.  Other people present during visit:  Patient Gail Barr is making good food choices. She needs to work on decreasing her portions.  ASSESSMENT  Height 5\' 8"  (1.727 m), weight 290 lb (131.543 kg). Body mass index is 44.1 kg/(m^2).  Initial Visit Information:  Are you currently following a meal plan?: No   Are you taking your medications as prescribed?: Yes     How often do you need to have someone help you when you read instructions, pamphlets, or other written materials from your doctor or pharmacy?: 1 - Never   Psychosocial:   Patient Belief/Attitude about Diabetes: Motivated to manage diabetes Self-care barriers: Other (comment) (self control) Self-management support: Doctor's office, Family, CDE visits Other persons present: Patient Patient Concerns: Nutrition/Meal planning, Healthy Lifestyle, Weight Control, Glycemic Control Special Needs: None Preferred Learning Style: No preference indicated Learning Readiness: Change in progress  Complications:   Last HgB A1C per patient/outside source: 7.9 mg/dL How often do you check your blood sugar?: 1-2 times/day Fasting Blood glucose range (mg/dL): 70-129 Postprandial Blood glucose range (mg/dL): 70-129 Have you had a dilated eye exam in the past 12 months?: Yes Have you had a dental exam in the past 12 months?: Yes  Diet Intake:  Breakfast: yogurt (Yoplait lowfat), kashi, cottage cheese (bakes her own breads, regular and "sweet bread" ) Lunch: salad, spinach, feta, black olives, mushrooms, peppers, grilled chicken, fruit, balsamic vinegarette Dinner: ziti, chicken vegetables, cheese, cottage cheese, salad (cous, cous, salmon), green beans, mixed vegetables Beverage(s): water, hot tea, ginger tea,  regular gingerale  Exercise:  Exercise: Moderate (swimming / aerobic walking) (goes to the gym and does cardio, walks) Moderate Exercise amount of time (min / week): 150  Individualized Plan for Diabetes Self-Management Training:   Learning Objective:  Patient will have a greater understanding of diabetes self-management.  Patient education plan per assessed needs and concerns is to attend individual sessions     Education Topics Reviewed with Patient Today:  Factors that contribute to the development of diabetes, Explored patient's options for treatment of their diabetes Role of diet in the treatment of diabetes and the relationship between the three main macronutrients and blood glucose level, Food label reading, portion sizes and measuring food., Carbohydrate counting, Information on hints to eating out and maintain blood glucose control., Meal options for control of blood glucose level and chronic complications. Role of exercise on diabetes management, blood pressure control and cardiac health., Helped patient identify appropriate exercises in relation to his/her diabetes, diabetes complications and other health issue. Reviewed patients medication for diabetes, action, purpose, timing of dose and side effects. Purpose and frequency of SMBG., Yearly dilated eye exam   Relationship between chronic complications and blood glucose control, Identified and discussed with patient  current chronic complications, Nephropathy, what it is, prevention of, the use of ACE, ARB's and early detection of through urine microalbumia., Dental care, Lipid levels, blood glucose control and heart disease, Reviewed with patient heart disease, higher risk of, and prevention       PATIENTS GOALS/Plan (Developed by the patient):  Nutrition: General guidelines for healthy choices and portions discussed Physical Activity: Exercise 3-5 times per week Medications: take my medication as prescribed Monitoring :  test blood glucose pre and post meals as  discussed Reducing Risk: get labs drawn   Patient Instructions  Plan:  Aim for 3 Carb Choices per meal (45 grams) +/- 1 either way  Aim for 0-15 Carbs per snack if hungry  Include protein in moderation with your meals and snacks Consider reading food labels for Total Carbohydrate and Fat Grams of foods Continue your activity level  daily as tolerated Consider checking BG at alternate times per day to include fasting and 2 hrs after your meal as directed by MD  Continue taking medication as directed by MD  Lose 1/2 to 2lbs per week Consider small frequent meals throughout the day     Expected Outcomes:  Demonstrated interest in learning. Expect positive outcomes  Education material provided: Living Well with Diabetes, A1C conversion sheet, Meal plan card, My Plate and Snack sheet  If problems or questions, patient to contact team via:  Phone  Future DSME appointment: PRN

## 2014-02-10 NOTE — Patient Instructions (Signed)
Plan:  Aim for 3 Carb Choices per meal (45 grams) +/- 1 either way  Aim for 0-15 Carbs per snack if hungry  Include protein in moderation with your meals and snacks Consider reading food labels for Total Carbohydrate and Fat Grams of foods Continue your activity level  daily as tolerated Consider checking BG at alternate times per day to include fasting and 2 hrs after your meal as directed by MD  Continue taking medication as directed by MD  Lose 1/2 to 2lbs per week Consider small frequent meals throughout the day

## 2014-02-14 ENCOUNTER — Ambulatory Visit: Payer: 59 | Admitting: *Deleted

## 2014-04-03 ENCOUNTER — Encounter: Payer: Self-pay | Admitting: Family Medicine

## 2014-04-03 ENCOUNTER — Ambulatory Visit (INDEPENDENT_AMBULATORY_CARE_PROVIDER_SITE_OTHER): Payer: 59 | Admitting: Family Medicine

## 2014-04-03 VITALS — BP 118/72 | HR 80 | Ht 68.0 in | Wt 280.0 lb

## 2014-04-03 DIAGNOSIS — E119 Type 2 diabetes mellitus without complications: Secondary | ICD-10-CM

## 2014-04-03 DIAGNOSIS — E785 Hyperlipidemia, unspecified: Secondary | ICD-10-CM

## 2014-04-03 DIAGNOSIS — R809 Proteinuria, unspecified: Secondary | ICD-10-CM

## 2014-04-03 DIAGNOSIS — R300 Dysuria: Secondary | ICD-10-CM

## 2014-04-03 LAB — POCT GLYCOSYLATED HEMOGLOBIN (HGB A1C): Hemoglobin A1C: 8.5

## 2014-04-03 LAB — POCT URINALYSIS DIPSTICK
Bilirubin, UA: NEGATIVE
Glucose, UA: NEGATIVE
Ketones, UA: NEGATIVE
Nitrite, UA: NEGATIVE
PH UA: 6
RBC UA: NEGATIVE
SPEC GRAV UA: 1.02
UROBILINOGEN UA: NEGATIVE

## 2014-04-03 MED ORDER — METFORMIN HCL ER 500 MG PO TB24: ORAL_TABLET | ORAL | Status: DC

## 2014-04-03 NOTE — Patient Instructions (Signed)
  You must take 2 metformin every day.  You can take them at the same time--only split them if you find that you have side effects (diarrhea/bloating) that are worse when taken together). Continue to limit carbs in your diet.  Continue to cut back and further dilute your juices. Continue to exercise and lose weight. Schedule your diabetic eye exam (and have them send me a copy of the note). Continue checking sugars.  Check it twice daily--once in the morning, fasting, and once 2 hours after dinner or at bedtime.  Keep a list, and fax/mail/call with these numbers in 2 weeks.  It does not appear that you have a bladder infection currently. Return for recheck if you develop worsening symptoms--urinary urgency, frequency, lower abdominal pain/pressure, blood in the urine, fever, flank pain

## 2014-04-03 NOTE — Progress Notes (Signed)
Chief Complaint  Patient presents with  . Diabetes    fasting med check (had only a yogurt @ 9:00am). (Was in [place] and did not take meds for a week)  . Urinary Tract Infection    thinks she may have a UTI has slight burning and odor x 2 weeks.    Patient presents for f/u on diabetes.  She saw the dietician in November. She has lost 10 pounds since her last visit. She continues to go to the gym regularly. She cut back a lot on carbs--only eats 1/2 cup of whole grain pasta, lots of vegetables.  Cut back on rice. Has some boiled green bananas, baked chicken. Cut back on the fruit in her salads. She is drinking water before eating.  Appetite is diminished since taking diabetes medication.  While her appetite is decreased, she is not skipping meals.  She eats a lot of celery. She is still drinking 8 ounces of orange juice but is now diluting it with 1/2 water (so only drinking 4 oz now). She also drinks a lot of cranberry juice--only 1/4 glass of juice, and diluted with water. She has cut back on her apple sauce snacks (no sugar added).  She forgot to bring her sugar log.  After last visit they were around 115 in the morning.  Since Thanksgiving, highest for morning is 98.   After lunch (2-3 hours) she is seeing 120's.  Last week it was up to 139.  She doesn't check it in the evening much, was <120 when she did.  Last week she didn't take ANY medications for about 10 days.  She didn't check her sugars those days (she forgot to take medicine and monitor with her on trip). She had mac and cheese and regular coke while in [place]--made her feel very tired.  She noticed she was urinating more frequently, and sugar smells like pineapple.  These symptoms have resolved.  She never increased to 3 metformin daily, and admits that she doesn't even always take 2.  She is taking two pills only 3x/week, 1 pill on the other days. Had some nausea initially, and some bloating and more frequent stools--not having diarrhea; side  effects are tolerable. She hasn't had diabetic eye exam yet.  She is having some discomfort when she has to void.  No burning or pain with voiding, but feels like she has trouble emptying--feels like she still has to pee, even when she finishes voiding.  Symptoms ongoing x 2 weeks.  She noticed an odor, but that resolved after she drank more water.  Urine looks cloudy.   PMH, PSH, SH reviewed.  Outpatient Encounter Prescriptions as of 04/03/2014  Medication Sig Note  . glucose blood test strip THIS IS FOR  ONE TOUCH VERIO PATIENT IS TO TEST BID Use as instructed   . levothyroxine (SYNTHROID, LEVOTHROID) 175 MCG tablet Take 175 mcg by mouth daily before breakfast.   . metFORMIN (GLUCOPHAGE-XR) 500 MG 24 hr tablet Take 2 tablet by mouth before breakfast.  Increase to 3/day if sugars >150 in the morning   . ONETOUCH DELICA LANCETS FINE MISC 1 each by Does not apply route 2 (two) times daily.   . [DISCONTINUED] metFORMIN (GLUCOPHAGE-XR) 500 MG 24 hr tablet Take once daily before breakfast.  Start at 1 tablet daily, and in a week increase to two.  Increase to 3 tablets daily if sugars still >150 (Patient taking differently: Take 1,000 mg by mouth daily with breakfast. ) 04/03/2014: Not truly  taking 2 daily.   . [DISCONTINUED] methocarbamol (ROBAXIN) 500 MG tablet Take 2 tablets (1,000 mg total) by mouth 2 (two) times daily. 12/22/2013: Prescribed in ER. Took a few, no longer taking   Allergies  Allergen Reactions  . Advil [Ibuprofen] Shortness Of Breath and Swelling    And chest pain.  Tolerates naproxen without problems  . Betadine [Povidone Iodine]     Sob, swelling  . Iohexol      Desc: Pt states [place] 5 [place]rs ago and experienced severe sob and swelling. We did her scan w/o iv contrast today.        Thanks., Onset Date: [id number]    ROS: no fevers, chills, URI symptoms, cough, shortness of breath.  Some decreased appetite, but no significant nausea.  Some bloating and  more frequent stools; no significant diarrhea. No blood in stool.  No chest pain, shortness of breath, bleeding, bruising, rash, numbness, tingling or other problems except urinary complaints as noted in HPI.  No abdominal pain, flank pain.  PHYSICAL EXAM: BP 118/72 mmHg  Pulse 80  Ht _0  (1.727 m)  Wt 280 lb (127.007 kg)  BMI 42.58 kg/m2  LMP 02/27/2014  Well developed, pleasant, obese female in no distress Neck: no lymphadenopathy, thyromegaly or mass Heart: regular rate and rhythm Lungs: clear bilaterally Abdomen: soft, nontender, no organomegaly or mass. No suprapubic tenderness Back: no CVA tenderness Extremities: wearing boots--no edema noted above level of boots Psych: normal mood, affect, hygiene and grooming Neuro: alert and oriented. Cranial nerves intact. Normal strength, sensation ,gait.  Lab Results  Component Value Date   HGBA1C 8.5 04/03/2014   ASSESSMENT/PLAN:  Diabetes mellitus without complication - Plan: HgB A1c, Microalbumin / creatinine urine ratio, Comprehensive metabolic panel, Hemoglobin A1c, TSH  Burning with urination - Plan: POCT Urinalysis Dipstick  New onset type 2 diabetes mellitus - Plan: metFORMIN (GLUCOPHAGE-XR) 500 MG 24 hr tablet  Hyperlipidemia - Plan: Lipid panel  Proteinuria   Diabetes--uncontrolled per A1c, but numbers good per pt reporting.  She isn't checking in the evenings; has improved diet and lost weight, and exercises regularly.  ?if elevation in A1c could be solely due to very high sugars x 10 days while off meds and worse diet, when traveling.  Advised to increase metformin to 1047m EVERY day.  Continue healthy diet--cut back more on juice, continue weight loss. Bring list to f/u in 3 months; call or return sooner if sugars running high.  To fax list of sugars in 2-3 weeks.  Add FWilder Glade(or similar med in that class) if sugars are not adequately controlled with metformin alone.  May try increasing metformin to 15029mday prior  to adding new med, if able to tolerate (only if needed, based on sugars).  No evidence of UTI.  She has some proteinuria. May need ACEI.  Add urine microalbumin to urine sample she gave.  Reviewed risks/side effects.  BP is well controlled.  Start low dose ACEI if abnl microalb.   You must take 2 metformin every day.  You can take them at the same time--only split them if you find that you have side effects (diarrhea/bloating) that are worse when taken together). Continue to limit carbs in your diet.  Continue to cut back and further dilute your juices. Continue to exercise and lose weight. Schedule your diabetic eye exam (and have them send me a copy of the note). Continue checking sugars.  Check it twice daily--once in the  morning, fasting, and once 2 hours after dinner or at bedtime.  Keep a list, and fax/mail/call with these numbers in 2 weeks.   Refuses flu shots--strongly encouraged Declines pneumovax today--also highly recommended.  May consider in future.  In 3 months, check lipid (was not at goal in October, will give add'l 3 months for dietary trial since the holidays). Also check TSH then, as well as A1c and c-met.   F/u 3 months with fasting labs prior. Diabetic foot exam at visit, and re-discuss pneumovax.

## 2014-04-04 ENCOUNTER — Encounter: Payer: Self-pay | Admitting: Family Medicine

## 2014-04-04 DIAGNOSIS — R809 Proteinuria, unspecified: Secondary | ICD-10-CM | POA: Insufficient documentation

## 2014-04-04 DIAGNOSIS — E1129 Type 2 diabetes mellitus with other diabetic kidney complication: Secondary | ICD-10-CM | POA: Insufficient documentation

## 2014-04-04 LAB — MICROALBUMIN / CREATININE URINE RATIO
Creatinine, Urine: 122.8 mg/dL
Microalb Creat Ratio: 230.5 mg/g — ABNORMAL HIGH (ref 0.0–30.0)
Microalb, Ur: 28.3 mg/dL — ABNORMAL HIGH (ref <2.0)

## 2014-04-05 ENCOUNTER — Other Ambulatory Visit: Payer: Self-pay | Admitting: *Deleted

## 2014-04-05 MED ORDER — LISINOPRIL 2.5 MG PO TABS: 2.5000 mg | ORAL_TABLET | Freq: Every day | ORAL | Status: DC

## 2014-07-03 ENCOUNTER — Other Ambulatory Visit: Payer: 59

## 2014-07-05 ENCOUNTER — Encounter: Payer: 59 | Admitting: Family Medicine

## 2014-09-05 ENCOUNTER — Telehealth: Payer: Self-pay | Admitting: Internal Medicine

## 2014-09-05 NOTE — Telephone Encounter (Signed)
Left message for pt to call back and schedule DM/med check with Dr. Tomi Bamberger

## 2015-01-25 ENCOUNTER — Ambulatory Visit (INDEPENDENT_AMBULATORY_CARE_PROVIDER_SITE_OTHER): Payer: 59 | Admitting: Family Medicine

## 2015-01-25 ENCOUNTER — Encounter: Payer: Self-pay | Admitting: Family Medicine

## 2015-01-25 ENCOUNTER — Other Ambulatory Visit: Payer: Self-pay | Admitting: *Deleted

## 2015-01-25 VITALS — BP 120/72 | HR 80 | Ht 68.0 in | Wt 262.2 lb

## 2015-01-25 DIAGNOSIS — Z9119 Patient's noncompliance with other medical treatment and regimen: Secondary | ICD-10-CM

## 2015-01-25 DIAGNOSIS — E1129 Type 2 diabetes mellitus with other diabetic kidney complication: Secondary | ICD-10-CM

## 2015-01-25 DIAGNOSIS — E1165 Type 2 diabetes mellitus with hyperglycemia: Secondary | ICD-10-CM | POA: Diagnosis not present

## 2015-01-25 DIAGNOSIS — R5383 Other fatigue: Secondary | ICD-10-CM

## 2015-01-25 DIAGNOSIS — R809 Proteinuria, unspecified: Secondary | ICD-10-CM | POA: Diagnosis not present

## 2015-01-25 DIAGNOSIS — E785 Hyperlipidemia, unspecified: Secondary | ICD-10-CM

## 2015-01-25 DIAGNOSIS — E039 Hypothyroidism, unspecified: Secondary | ICD-10-CM

## 2015-01-25 DIAGNOSIS — E119 Type 2 diabetes mellitus without complications: Secondary | ICD-10-CM

## 2015-01-25 DIAGNOSIS — IMO0002 Reserved for concepts with insufficient information to code with codable children: Secondary | ICD-10-CM

## 2015-01-25 DIAGNOSIS — Z91199 Patient's noncompliance with other medical treatment and regimen due to unspecified reason: Secondary | ICD-10-CM

## 2015-01-25 LAB — CBC WITH DIFFERENTIAL/PLATELET
BASOS PCT: 0 % (ref 0–1)
Basophils Absolute: 0 10*3/uL (ref 0.0–0.1)
EOS PCT: 2 % (ref 0–5)
Eosinophils Absolute: 0.2 10*3/uL (ref 0.0–0.7)
HEMATOCRIT: 39.7 % (ref 36.0–46.0)
Hemoglobin: 13.3 g/dL (ref 12.0–15.0)
Lymphocytes Relative: 38 % (ref 12–46)
Lymphs Abs: 2.9 10*3/uL (ref 0.7–4.0)
MCH: 26.4 pg (ref 26.0–34.0)
MCHC: 33.5 g/dL (ref 30.0–36.0)
MCV: 78.9 fL (ref 78.0–100.0)
MONO ABS: 0.4 10*3/uL (ref 0.1–1.0)
MONOS PCT: 5 % (ref 3–12)
MPV: 10.6 fL (ref 8.6–12.4)
Neutro Abs: 4.2 10*3/uL (ref 1.7–7.7)
Neutrophils Relative %: 55 % (ref 43–77)
Platelets: 377 10*3/uL (ref 150–400)
RBC: 5.03 MIL/uL (ref 3.87–5.11)
RDW: 17 % — AB (ref 11.5–15.5)
WBC: 7.6 10*3/uL (ref 4.0–10.5)

## 2015-01-25 LAB — COMPREHENSIVE METABOLIC PANEL
ALT: 17 U/L (ref 6–29)
AST: 14 U/L (ref 10–35)
Albumin: 4 g/dL (ref 3.6–5.1)
Alkaline Phosphatase: 65 U/L (ref 33–115)
BUN: 9 mg/dL (ref 7–25)
CALCIUM: 9.6 mg/dL (ref 8.6–10.2)
CHLORIDE: 102 mmol/L (ref 98–110)
CO2: 26 mmol/L (ref 20–31)
Creat: 0.93 mg/dL (ref 0.50–1.10)
GLUCOSE: 197 mg/dL — AB (ref 65–99)
POTASSIUM: 4.5 mmol/L (ref 3.5–5.3)
Sodium: 136 mmol/L (ref 135–146)
Total Bilirubin: 0.5 mg/dL (ref 0.2–1.2)
Total Protein: 7.5 g/dL (ref 6.1–8.1)

## 2015-01-25 LAB — LIPID PANEL
CHOL/HDL RATIO: 5.4 ratio — AB (ref <=5.0)
Cholesterol: 190 mg/dL (ref 125–200)
HDL: 35 mg/dL — AB (ref >=46)
LDL CALC: 110 mg/dL (ref <130)
TRIGLYCERIDES: 224 mg/dL — AB (ref <150)
VLDL: 45 mg/dL — ABNORMAL HIGH (ref <30)

## 2015-01-25 LAB — TSH: TSH: 12.618 u[IU]/mL — AB (ref 0.350–4.500)

## 2015-01-25 LAB — POCT GLYCOSYLATED HEMOGLOBIN (HGB A1C): Hemoglobin A1C: >14

## 2015-01-25 MED ORDER — DAPAGLIFLOZIN PROPANEDIOL 5 MG PO TABS: 5.0000 mg | ORAL_TABLET | Freq: Every day | ORAL | Status: DC

## 2015-01-25 MED ORDER — METFORMIN HCL ER 500 MG PO TB24: ORAL_TABLET | ORAL | Status: DC

## 2015-01-25 MED ORDER — GLUCOSE BLOOD VI STRP: ORAL_STRIP | Status: DC

## 2015-01-25 NOTE — Progress Notes (Signed)
Chief Complaint  Patient presents with  . Diabetes    nonfasting med check. Had a reading of 488 last week after drinking 4 glasses of OJ.   . Flu Vaccine    declined  . Fatigue    over the last 4 weeks has been experiencing fatigue from time to time.    Patient presents for follow up on diabetes.  She was supposed to follow up in April, last seen in January.  She reports she was out of the country from February until June.  She was supposed to fax [place] her blood sugars after her last visit, but never did. She was supposed to be started on ACEI for microalbuminuria, but she states she told the nurse she wouldn't take it (nurse reports she wasn't told that, or she would have forwarded that message to me).  Pt states she prefers the more natural route to treatment, rather than medications. See below.  She reports she always urinates a lot, thinks it is because she drinks water all the time. Denies excessive thirst, just tries to drink a lot of water.  She reports checking her sugars twice daily, but checks immediately after dinner. Reports her morning sugars are fine, usually <130. Last Monday, when craving sweets/sugar, she decided to have some orange juice (which she loves, but had cut back on to just a small amount when told she had diabetes).  She reports having had 4 glasses of juice, and that her sugar went up to 488. After that she was careful with her diet, fasted to have her sugars come down Last Monday She forgot to bring her list of blood sugars to today's visit.  She was out of the country for 4 months to take care of her mother. She has been back since June. She continues to eat healthy, working out regularly (except when she was away). She didn't check her sugars much when she was home. Mostly ate vegetarian, did have some carbs.  Per last visit, was supposed to fax results and meds to be changed. She never gave [place] results. She continued on metformin 2 tabs in the morning.  She felt  sick when she took 3, had some diarrhea (was told to take 3 if fasting glucose >150).  After her last visit, she was found to have microalbuminuria.  Lisinopril 2.5 was recommended, but she admits that she never started this. She states she told the nurse that she wouldn't take the ACEI. She wants to try natural meds rather than prescriptions  Numbness/tinglig preceded her dx of diabetes, unchanged, no worsening Chronic nausea, associated with spell of fatigue.  States she got metformin prescription while "home" (out of the country). She denies running out. She also denies running out of Synthroid (and reports getting branded med, not generic).  PMH, [place] SH reviewed.  Outpatient Encounter Prescriptions as of 01/25/2015  Medication Sig Note  . Cholecalciferol (VITAMIN D) 2000 UNITS tablet Take 2,000 Units by mouth daily.   Marland Kitchen glucose blood test strip THIS IS FOR  ONE TOUCH VERIO PATIENT IS TO TEST BID Use as instructed   . levothyroxine (SYNTHROID, LEVOTHROID) 175 MCG tablet Take 175 mcg by mouth daily before breakfast. 01/25/2015: Pt states she is on the brand  . metFORMIN (GLUCOPHAGE-XR) 500 MG 24 hr tablet Take 2 tablet by mouth before breakfast.  Increase to 3/day if sugars >150 in the morning   . Multiple Vitamins-Minerals (MULTIVITAMIN WITH MINERALS) tablet Take 1 tablet by mouth daily.   Marland Kitchen  ONETOUCH DELICA LANCETS FINE MISC 1 each by Does not apply route 2 (two) times daily.   Marland Kitchen lisinopril (PRINIVIL,ZESTRIL) 2.5 MG tablet Take 1 tablet (2.5 mg total) by mouth daily. (Patient not taking: Reported on 01/25/2015)    No facility-administered encounter medications on file as of 01/25/2015.   Allergies  Allergen Reactions  . Advil [Ibuprofen] Shortness Of Breath and Swelling    And chest pain.  Tolerates naproxen without problems  . Betadine [Povidone Iodine]     Sob, swelling  . Iohexol      Desc: Pt states she [place] a CT out [place] approx 5 yrs ago and experienced severe sob and swelling.  We did her scan w/o iv contrast today.        Thanks., Onset Date: [id number]    ROS:  No fever, chills, URI symptoms, chest pain, shortness of breath, headaches, dizziness, GI or GU complaints.  Denies dysuria, incontinence or other UTI symptoms. Some mild numbess/tingling (chronic, unchanged). Lost 18# since her last visit in January. Denies hair loss. +fatigue.   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 Refuses to consider insulin. Start Farxiga, increase metformin. Bring list of sugars, checking BID to f/u 2wks - Plan: Comprehensive metabolic panel, Lipid panel, Microalbumin / creatinine urine ratio, dapagliflozin propanediol (FARXIGA) 5 MG TABS tablet, metFORMIN (GLUCOPHAGE-XR) 500 MG 24 hr tablet  Hypothyroidism, unspecified hypothyroidism type - Plan: TSH  Diabetes mellitus without complication ([place]) - Plan: HgB A1c  Microalbuminuria - refused ACEI (never started, won't take) - Plan: Microalbumin / creatinine urine ratio  Other fatigue - Plan: CBC with Differential/Platelet, Vit D  25 hydroxy (rtn osteoporosis monitoring), TSH  New onset type 2 diabetes mellitus ([place])  Noncompliance with diabetes treatment  Hyperlipidemia - last LDL>100. Due for recheck. Had been fasting, but ate some granola while in exam room prior to blood draw  Lipids (had small amt of granola  with cranberries, not completely fasting) TSH, c-met microalb  F/u in 2 weeks with glucose log, to review labs and discuss medication  (pt was very resistant to returning for visit, wanting to drop off list instead). Explained the need for visit, to review all results, in case med changes need to be made, rather than having miscommunication and making uninformed decision (as occurred related to ACEI and microalbuminuria).  Increase metformin to 3/day (divided, if side effects from taking together). Start Farxiga--at 3m, plan to increase to 142mat f/u if sugars remain high. Risks and side effects reviewed at length. Diet reviewed. Based on A1c of >14, insulin was recommended, discussed reason for short-term insulin, but she refuses.  Doesn't believe it because sugars she has been checking weren't high until she drank the OJ last week.   Start FaEwingTake 1 tablet daily. Continue the metformin--see if you can tolerate taking a 3rd pill with dinner (and continue taking 2 before breakfast).  Check your  sugars twice daily.  Record morning, evening (2 hours after dinner or at bedtime), and also "comments" section, where you might put reasons for outliers (how you felt, if you missed a pill, if you ate cake, etc). BRING THIS LIST TO YOUR VISIT in 2 weeks.    Declines flu shots, strongly recommended, risks of complications from influenza infection reviewed.  45-50 minute face to face visit, more than 1/2 spent counseling.

## 2015-01-25 NOTE — Patient Instructions (Signed)
  Start [place]. Take 1 tablet daily. Continue the metformin--see if you can tolerate taking a 3rd pill with dinner (and continue taking 2 before breakfast).  Check your sugars twice daily.  Record morning, evening (2 hours after dinner or at bedtime), and also "comments" section, where you might put reasons for outliers (how you felt, if you missed a pill, if you ate cake, etc). BRING THIS LIST TO YOUR VISIT in 2 weeks.

## 2015-01-26 LAB — MICROALBUMIN / CREATININE URINE RATIO
Creatinine, Urine: 130 mg/dL (ref 20–320)
MICROALB/CREAT RATIO: 126 ug/mg{creat} — AB (ref <30)
Microalb, Ur: 16.4 mg/dL

## 2015-01-26 LAB — VITAMIN D 25 HYDROXY (VIT D DEFICIENCY, FRACTURES): VIT D 25 HYDROXY: 11 ng/mL — AB (ref 30–100)

## 2015-02-07 ENCOUNTER — Ambulatory Visit: Payer: 59 | Admitting: Family Medicine

## 2015-02-08 ENCOUNTER — Telehealth: Payer: Self-pay | Admitting: *Deleted

## 2015-02-08 NOTE — Telephone Encounter (Signed)
This patient no showed for their appointment today.Which of the following is necessary for this patient.   A) No follow-up necessary   B) Follow-up urgent. Locate Patient Immediately.   C) Follow-up necessary. Contact patient and Schedule visit in ____ Days.   D) Follow-up Advised. Contact patient and Schedule visit in ____ Days. 

## 2015-02-08 NOTE — Telephone Encounter (Signed)
We have been trying to contact her with her test results, some of which were abnormal. Her diabetes is uncontrolled, and she was supposed to bring her list of blood sugars to her visit after starting new medication.  She needs to r/s her appointment, but also be notified of our no show policy, and potential for dismissal. She has multiple no shows in the past, but given the severity of her current condition, I'm willing to let her reschedule once.  If you cannot reach her by phone to reschedule and let her know, then she needs to be sent a letter with this info.  Thanks

## 2015-02-09 ENCOUNTER — Encounter: Payer: Self-pay | Admitting: Family Medicine

## 2015-02-09 NOTE — Telephone Encounter (Signed)
Pt was called and another appt scheduled for Monday. No show letter mailed.

## 2015-02-12 ENCOUNTER — Encounter: Payer: Self-pay | Admitting: Family Medicine

## 2015-02-12 ENCOUNTER — Ambulatory Visit (INDEPENDENT_AMBULATORY_CARE_PROVIDER_SITE_OTHER): Payer: 59 | Admitting: Family Medicine

## 2015-02-12 VITALS — BP 126/76 | HR 72 | Ht 68.0 in | Wt 262.8 lb

## 2015-02-12 DIAGNOSIS — E782 Mixed hyperlipidemia: Secondary | ICD-10-CM | POA: Diagnosis not present

## 2015-02-12 DIAGNOSIS — IMO0002 Reserved for concepts with insufficient information to code with codable children: Secondary | ICD-10-CM

## 2015-02-12 DIAGNOSIS — E1129 Type 2 diabetes mellitus with other diabetic kidney complication: Secondary | ICD-10-CM

## 2015-02-12 DIAGNOSIS — E1165 Type 2 diabetes mellitus with hyperglycemia: Secondary | ICD-10-CM | POA: Diagnosis not present

## 2015-02-12 DIAGNOSIS — E039 Hypothyroidism, unspecified: Secondary | ICD-10-CM

## 2015-02-12 DIAGNOSIS — E559 Vitamin D deficiency, unspecified: Secondary | ICD-10-CM | POA: Diagnosis not present

## 2015-02-12 MED ORDER — SYNTHROID 175 MCG PO TABS: 175.0000 ug | ORAL_TABLET | Freq: Every day | ORAL | Status: DC

## 2015-02-12 MED ORDER — DAPAGLIFLOZIN PROPANEDIOL 10 MG PO TABS: 10.0000 mg | ORAL_TABLET | Freq: Every day | ORAL | Status: DC

## 2015-02-12 MED ORDER — ATORVASTATIN CALCIUM 20 MG PO TABS: 20.0000 mg | ORAL_TABLET | Freq: Every day | ORAL | Status: DC

## 2015-02-12 MED ORDER — VITAMIN D (ERGOCALCIFEROL) 1.25 MG (50000 UNIT) PO CAPS: 50000.0000 [IU] | ORAL_CAPSULE | ORAL | Status: DC

## 2015-02-12 NOTE — Patient Instructions (Signed)
  Increase Farxiga to 10mg  Continue the metformin at 3 tablets daily (I know that eventually you would like to get this dose down).  Consider taking metamucil with your metformin to help with the loose stools.  Please try and take your thyroid medication every day, first thing in the morning (along with the metformin if you have to, but at least 3 hours separate from your multivitamin).  Take the Vitamin D prescription once a week for 12 weeks.  We may need to eventually give you more than 12 weeks, but we will recheck the level before then., Once you finish all prescription vitamin D, you will need to start taking a separate Vitamin D3 once daily, along with your multivitamin, every day, long-term, in order to maintain your levels (otherwise they will drop back down again).  We are starting you on Atorvastatin (generic Lipitor).  Take 1 pill at bedtime (if you find you are forgetting this frequently, if you don't have side effects, feel free to change this to a morning time dose).  If you develop any muscle aches or cramps, try adding Coenzyme Q10 prior to calling me to change the medication.

## 2015-02-12 NOTE — Progress Notes (Signed)
Chief Complaint  Patient presents with  . Diabetes    follow up on diabetes.    Patient presents to follow up on her labs (we weren't able to reach her with results), and follow up on her uncontrolled diabetes.  Diabetes:  She was started on Farxiga 5mg  at last visit, in addition to increasing the metformin dose to 1500mg  daily (2 in the morning, 1 in the evening).  She has noticed increased urination, and some cloudiness to the urine, but no other side effects.  Denies dysuria, itching, discharge.  Fasting sugars 135-145 2 hours after eating lunch and dinner are running 130-153, once up to 185. She just started taking up running again (this week).  Hypothyroidism:  She admits to missing her medication more than 2x/week.  Loose stools after taking metformin in the morning.  Otherwise no changes in hair/skin/energy.  Vitamin D deficiency:  She has only been taking a multivitamin daily (not taking separate 2000 IU of Vitamin D).  PMH, PSH, SH reviewed. Meds and allergies reviewed.  ROS: no fever, chills, URI symptoms, chest pain, headaches, dizziness, shortness of breath, nausea, vomiting.+diarrhea/loose stools after taking metformin.  Denies dysuria, vaginal discharge, bleeding, bruising, rash, depression, or other complaints.  PHYSICAL EXAM:  BP 126/76 mmHg  Pulse 72  Ht 5\' 8"  (1.727 m)  Wt 262 lb 12.8 oz (119.205 kg)  BMI 39.97 kg/m2  LMP 12/29/2014  Well developed, pleasant, obese female in good spirits. Normal mood, affect, hygiene and grooming Normal speech, eye contact. Alert and oriented, normal strength, gait, cranial nerves   Lab Results  Component Value Date   HGBA1C >14.0 01/25/2015     Chemistry      Component Value Date/Time   NA 136 01/25/2015 0001   K 4.5 01/25/2015 0001   CL 102 01/25/2015 0001   CO2 26 01/25/2015 0001   BUN 9 01/25/2015 0001   CREATININE 0.93 01/25/2015 0001   CREATININE 0.90 12/12/2013 0049      Component Value Date/Time   CALCIUM  9.6 01/25/2015 0001   ALKPHOS 65 01/25/2015 0001   AST 14 01/25/2015 0001   ALT 17 01/25/2015 0001   BILITOT 0.5 01/25/2015 0001     Fasting glucose 197  Lab Results  Component Value Date   CHOL 190 01/25/2015   HDL 35* 01/25/2015   LDLCALC 110 01/25/2015   TRIG 224* 01/25/2015   CHOLHDL 5.4* 01/25/2015   Lab Results  Component Value Date   WBC 7.6 01/25/2015   HGB 13.3 01/25/2015   HCT 39.7 01/25/2015   MCV 78.9 01/25/2015   PLT 377 01/25/2015   Vitamin D-OH 11  Lab Results  Component Value Date   TSH 12.618* 01/25/2015    ASSESSMENT/PLAN:  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  - Plan: dapagliflozin propanediol (FARXIGA) 10 MG TABS tablet  Hypothyroidism, unspecified hypothyroidism type - elevated TSH due to noncompliance. Encouraged compliance with meds; continue same dose and recheck 2 mos - Plan: SYNTHROID 175 MCG tablet  Vitamin D deficiency - 12 weeks of Rx, and reviewed importance of long-term OTC supplementation - Plan: Vitamin D, Ergocalciferol, (DRISDOL) 50000 UNITS CAPS capsule  Mixed hyperlipidemia - diet reviewed, goals reviewed. Start statin.  risks/side effects reviewed in detail - Plan: atorvastatin (LIPITOR) 20 MG tablet   Increase Farxiga to 10mg  Continue the metformin at 3 tablets daily (I know that  eventually you would like to get this dose down).  Consider taking metamucil with your metformin to help with the loose stools.  Please try and take your thyroid medication every day, first thing in the morning (along with the metformin if you have to, but at least 3 hours separate from your multivitamin).  Take the Vitamin D prescription once a week for 12 weeks.  We may need to eventually give you more than 12 weeks, but we will recheck the level before then., Once you finish all  prescription vitamin D, you will need to start taking a separate Vitamin D3 once daily, along with your multivitamin, every day, long-term, in order to maintain your levels (otherwise they will drop back down again).  We are starting you on Atorvastatin (generic Lipitor).  Take 1 pill at bedtime (if you find you are forgetting this frequently, if you don't have side effects, feel free to change this to a morning time dose).  If you develop any muscle aches or cramps, try adding Coenzyme Q10 prior to calling me to change the medication.   F/u 10 weeks with labs prior.  30 min visit, more than 1/2 spent counseling.

## 2015-02-19 ENCOUNTER — Telehealth: Payer: Self-pay

## 2015-02-19 DIAGNOSIS — E1165 Type 2 diabetes mellitus with hyperglycemia: Principal | ICD-10-CM

## 2015-02-19 DIAGNOSIS — IMO0002 Reserved for concepts with insufficient information to code with codable children: Secondary | ICD-10-CM

## 2015-02-19 DIAGNOSIS — E1129 Type 2 diabetes mellitus with other diabetic kidney complication: Secondary | ICD-10-CM

## 2015-02-19 MED ORDER — METFORMIN HCL ER 500 MG PO TB24: ORAL_TABLET | ORAL | Status: DC

## 2015-02-19 NOTE — Telephone Encounter (Signed)
Done

## 2015-02-19 NOTE — Telephone Encounter (Signed)
Refill request for 90 day supply of Metformin 500mg . Before now she was only getting 90 pills instead of 90 days.

## 2015-02-19 NOTE — Telephone Encounter (Signed)
Ok to change to 90 day supply, no refill (#270) (cancel refills on the other rx that was sent earlier in November)

## 2015-04-23 ENCOUNTER — Other Ambulatory Visit: Payer: 59

## 2015-04-23 DIAGNOSIS — E1129 Type 2 diabetes mellitus with other diabetic kidney complication: Secondary | ICD-10-CM

## 2015-04-23 DIAGNOSIS — E039 Hypothyroidism, unspecified: Secondary | ICD-10-CM

## 2015-04-23 DIAGNOSIS — E559 Vitamin D deficiency, unspecified: Secondary | ICD-10-CM

## 2015-04-23 DIAGNOSIS — E782 Mixed hyperlipidemia: Secondary | ICD-10-CM

## 2015-04-23 DIAGNOSIS — E1165 Type 2 diabetes mellitus with hyperglycemia: Principal | ICD-10-CM

## 2015-04-23 DIAGNOSIS — IMO0002 Reserved for concepts with insufficient information to code with codable children: Secondary | ICD-10-CM

## 2015-04-23 LAB — TSH: TSH: 18.604 u[IU]/mL — AB (ref 0.350–4.500)

## 2015-04-23 LAB — LIPID PANEL
Cholesterol: 165 mg/dL (ref 125–200)
HDL: 32 mg/dL — ABNORMAL LOW (ref >=46)
LDL CALC: 100 mg/dL (ref <130)
Total CHOL/HDL Ratio: 5.2 Ratio — ABNORMAL HIGH (ref <=5.0)
Triglycerides: 166 mg/dL — ABNORMAL HIGH (ref <150)
VLDL: 33 mg/dL — AB (ref <30)

## 2015-04-23 LAB — HEPATIC FUNCTION PANEL
ALT: 10 U/L (ref 6–29)
AST: 10 U/L (ref 10–35)
Albumin: 3.7 g/dL (ref 3.6–5.1)
Alkaline Phosphatase: 55 U/L (ref 33–115)
BILIRUBIN DIRECT: 0.1 mg/dL (ref <=0.2)
BILIRUBIN INDIRECT: 0.3 mg/dL (ref 0.2–1.2)
BILIRUBIN TOTAL: 0.4 mg/dL (ref 0.2–1.2)
Total Protein: 7 g/dL (ref 6.1–8.1)

## 2015-04-23 LAB — HEMOGLOBIN A1C
Hgb A1c MFr Bld: 8 % — ABNORMAL HIGH (ref <5.7)
MEAN PLASMA GLUCOSE: 183 mg/dL — AB (ref <117)

## 2015-04-23 LAB — GLUCOSE, RANDOM: Glucose, Bld: 190 mg/dL — ABNORMAL HIGH (ref 65–99)

## 2015-04-24 LAB — VITAMIN D 25 HYDROXY (VIT D DEFICIENCY, FRACTURES): VIT D 25 HYDROXY: 8 ng/mL — AB (ref 30–100)

## 2015-04-25 ENCOUNTER — Encounter: Payer: 59 | Admitting: Family Medicine

## 2015-05-02 ENCOUNTER — Encounter: Payer: 59 | Admitting: Family Medicine

## 2015-05-07 ENCOUNTER — Telehealth: Payer: Self-pay | Admitting: Family Medicine

## 2015-05-07 NOTE — Telephone Encounter (Signed)
Okay for copy of labs so she has for her visit. We will send office records directly to her new PCP when release forms are received from them.

## 2015-05-07 NOTE — Telephone Encounter (Signed)
Veronica called patient per your request to schedule Medcheck, Patient called back, she states when she called on Friday and cancelled her appointment that she told whomever she spoke to that She was cancelling because she was going to be going to another PCP and asked to have a copy of her labs mailed to her  Is it all right to send her a copy of her labs?

## 2015-05-08 NOTE — Telephone Encounter (Signed)
Last labs mailed to patient per her request, approved by Dr. Tomi Bamberger

## 2015-06-01 ENCOUNTER — Telehealth: Payer: Self-pay | Admitting: Family Medicine

## 2015-06-01 DIAGNOSIS — Z0279 Encounter for issue of other medical certificate: Secondary | ICD-10-CM

## 2015-06-01 NOTE — Telephone Encounter (Signed)
Received payment from [facility]. Per call records to be faxed to [phone]

## 2015-06-01 NOTE — Telephone Encounter (Signed)
Received sign records request from [facility]. Records are ready to be faxed to [phone]. [facility] informed of $50 records fee. Waiting for payment

## 2015-09-06 ENCOUNTER — Other Ambulatory Visit: Payer: Self-pay | Admitting: Obstetrics and Gynecology

## 2015-09-07 LAB — CYTOLOGY - PAP

## 2015-09-26 IMAGING — CR DG CHEST 2V
2 series · 2 of 2 positions shown · non-contrast
Comparison: None.

CLINICAL DATA: Chest pain and dizziness

EXAM:
CHEST  2 VIEW

[w chest pa]
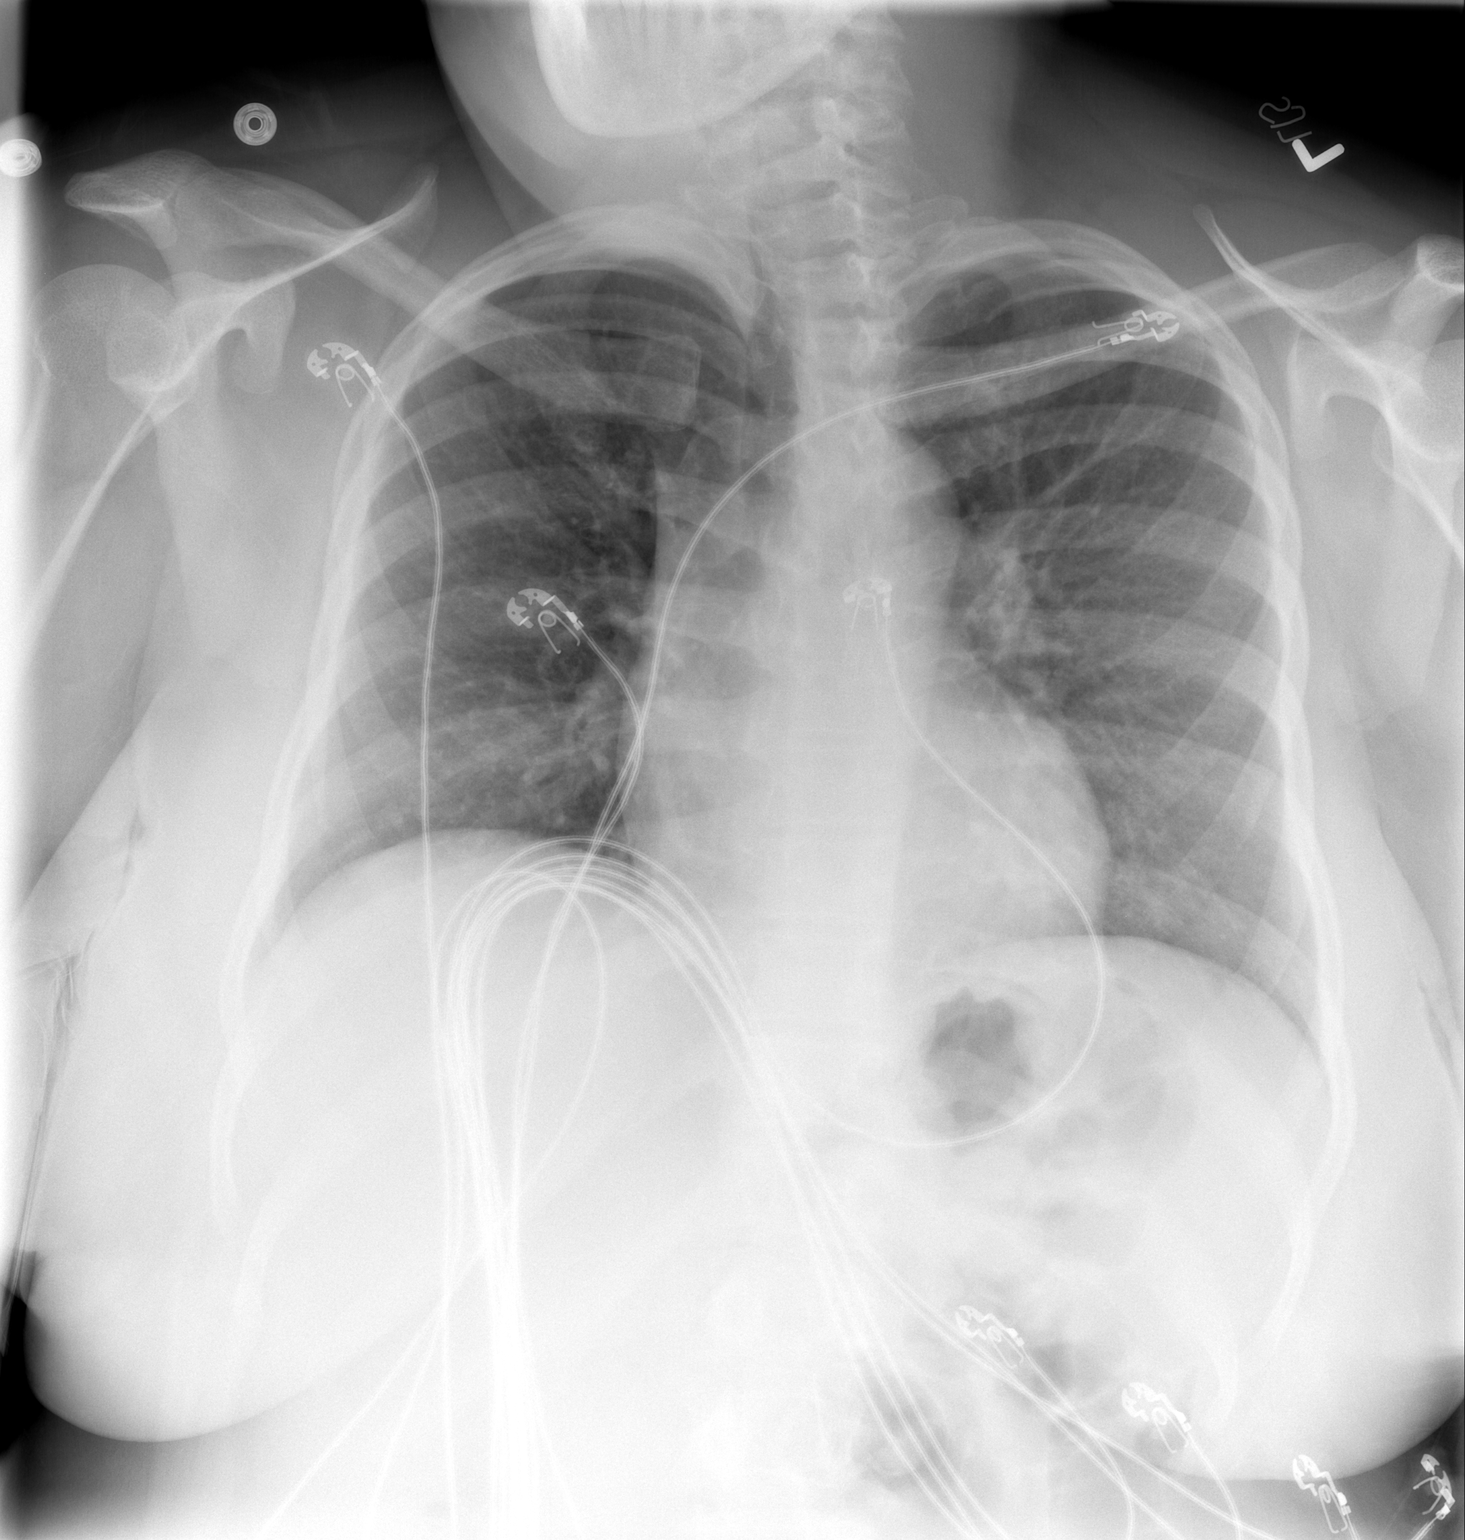

[w chest lat]
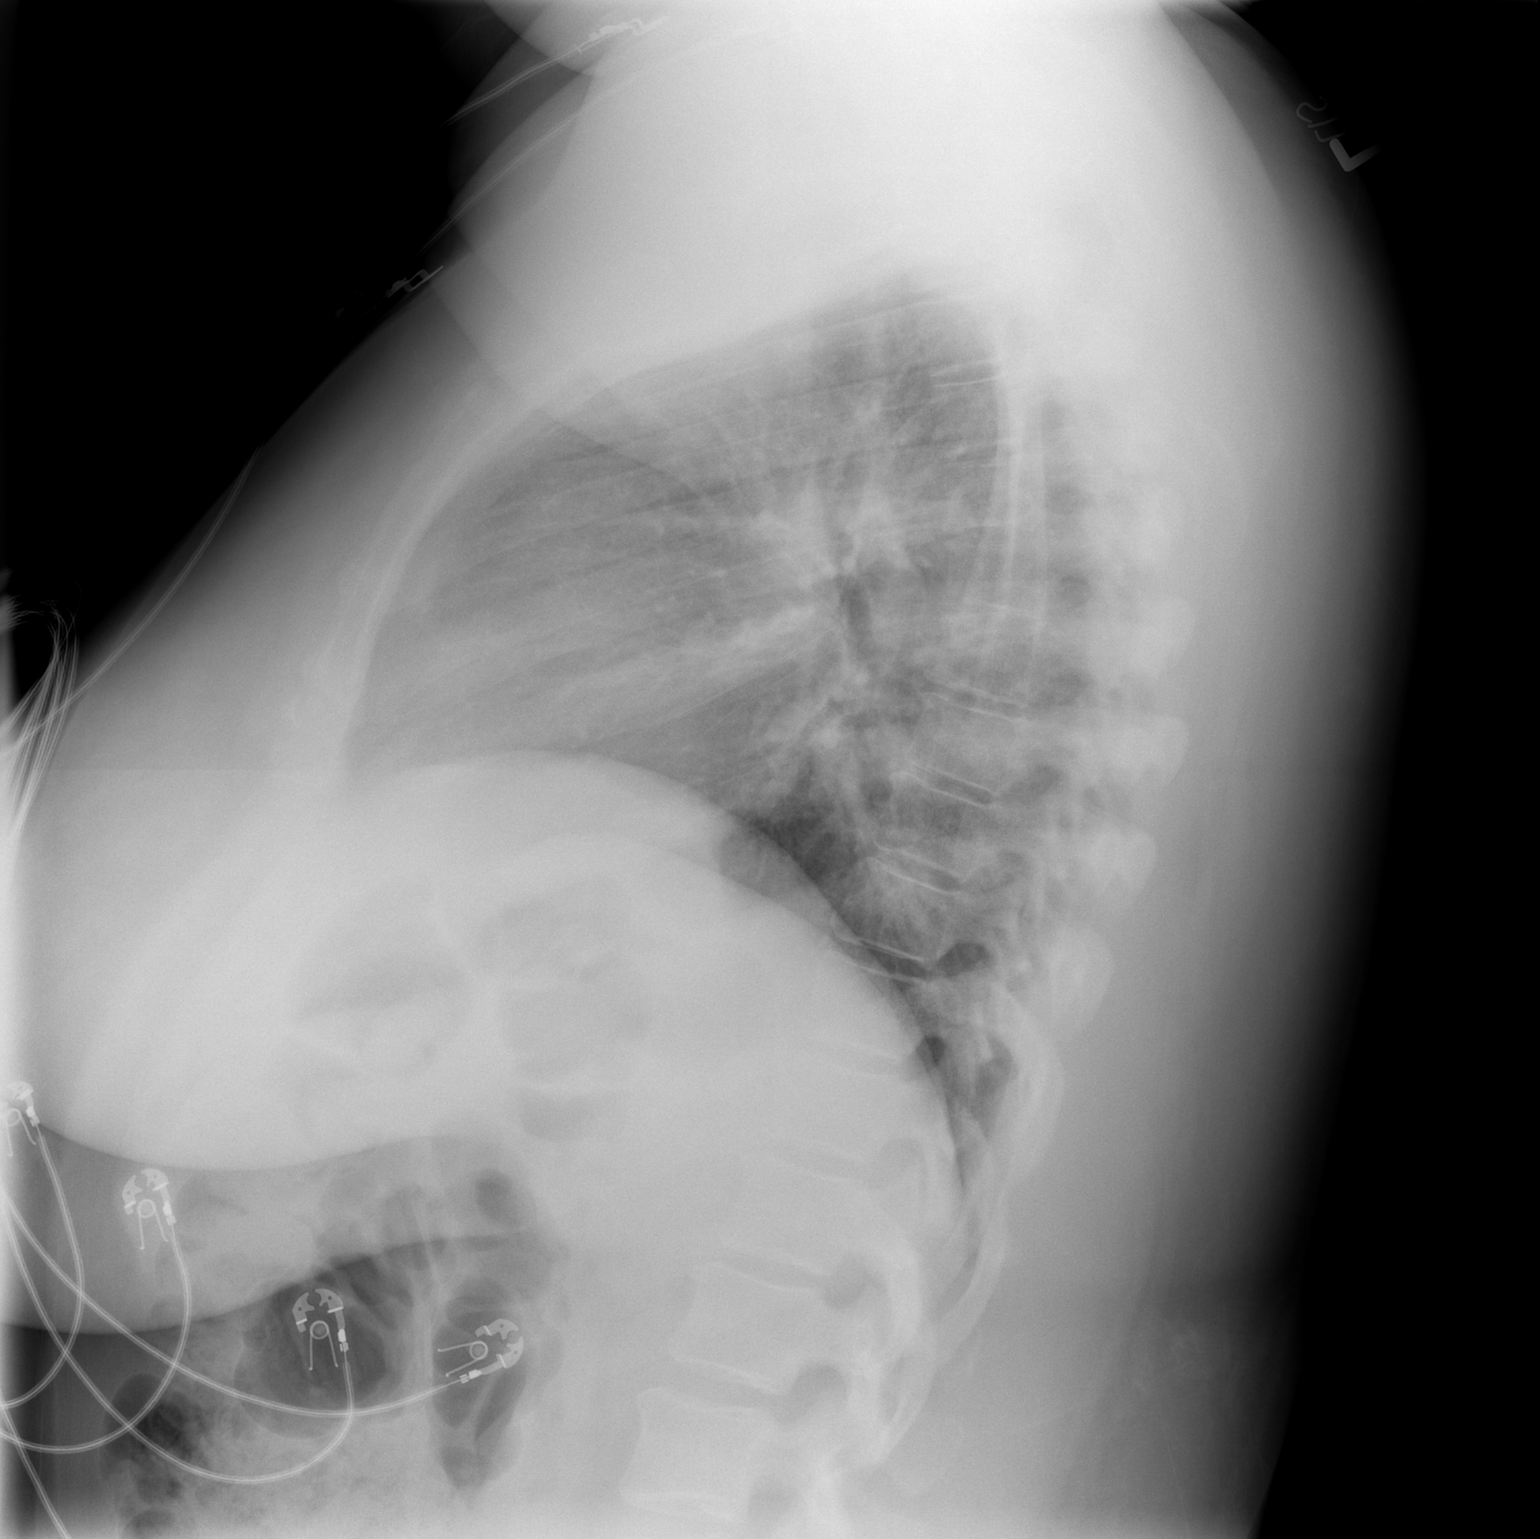

[2 of 2 positions shown; findings below may reference images not displayed]

FINDINGS: No cardiomegaly. Mediastinal contours distorted by rightward
rotation. No infiltrate, edema, effusion, or pneumothorax. No acute
osseous findings.
IMPRESSION: No active cardiopulmonary disease.

## 2015-11-02 ENCOUNTER — Emergency Department (HOSPITAL_COMMUNITY): Payer: 59

## 2015-11-02 ENCOUNTER — Encounter (HOSPITAL_COMMUNITY): Payer: Self-pay | Admitting: *Deleted

## 2015-11-02 ENCOUNTER — Emergency Department (HOSPITAL_COMMUNITY)
Admission: EM | Admit: 2015-11-02 | Discharge: 2015-11-03 | Disposition: A | Discharge status: Home / Self Care | Payer: 59 | Attending: Emergency Medicine | Admitting: Emergency Medicine

## 2015-11-02 DIAGNOSIS — E039 Hypothyroidism, unspecified: Secondary | ICD-10-CM | POA: Insufficient documentation

## 2015-11-02 DIAGNOSIS — E1129 Type 2 diabetes mellitus with other diabetic kidney complication: Secondary | ICD-10-CM | POA: Insufficient documentation

## 2015-11-02 DIAGNOSIS — R791 Abnormal coagulation profile: Secondary | ICD-10-CM | POA: Insufficient documentation

## 2015-11-02 DIAGNOSIS — Z7984 Long term (current) use of oral hypoglycemic drugs: Secondary | ICD-10-CM | POA: Insufficient documentation

## 2015-11-02 DIAGNOSIS — Z79899 Other long term (current) drug therapy: Secondary | ICD-10-CM | POA: Diagnosis not present

## 2015-11-02 DIAGNOSIS — R42 Dizziness and giddiness: Secondary | ICD-10-CM | POA: Insufficient documentation

## 2015-11-02 LAB — CBG MONITORING, ED: Glucose-Capillary: 171 mg/dL — ABNORMAL HIGH (ref 65–99)

## 2015-11-02 LAB — DIFFERENTIAL
Basophils Absolute: 0 10*3/uL (ref 0.0–0.1)
Basophils Relative: 0 %
EOS PCT: 1 %
Eosinophils Absolute: 0.1 10*3/uL (ref 0.0–0.7)
LYMPHS ABS: 2 10*3/uL (ref 0.7–4.0)
LYMPHS PCT: 29 %
MONO ABS: 0.3 10*3/uL (ref 0.1–1.0)
MONOS PCT: 4 %
Neutro Abs: 4.6 10*3/uL (ref 1.7–7.7)
Neutrophils Relative %: 66 %

## 2015-11-02 LAB — I-STAT CHEM 8, ED
BUN: 11 mg/dL (ref 6–20)
CHLORIDE: 105 mmol/L (ref 101–111)
Calcium, Ion: 1.13 mmol/L (ref 1.13–1.30)
Creatinine, Ser: 1.3 mg/dL — ABNORMAL HIGH (ref 0.44–1.00)
GLUCOSE: 165 mg/dL — AB (ref 65–99)
HCT: 45 % (ref 36.0–46.0)
Hemoglobin: 15.3 g/dL — ABNORMAL HIGH (ref 12.0–15.0)
Potassium: 4.1 mmol/L (ref 3.5–5.1)
Sodium: 141 mmol/L (ref 135–145)
TCO2: 24 mmol/L (ref 0–100)

## 2015-11-02 LAB — TROPONIN I

## 2015-11-02 LAB — COMPREHENSIVE METABOLIC PANEL
ALK PHOS: 64 U/L (ref 38–126)
ALT: 16 U/L (ref 14–54)
ANION GAP: 9 (ref 5–15)
AST: 18 U/L (ref 15–41)
Albumin: 4.1 g/dL (ref 3.5–5.0)
BILIRUBIN TOTAL: 0.7 mg/dL (ref 0.3–1.2)
BUN: 10 mg/dL (ref 6–20)
CHLORIDE: 105 mmol/L (ref 101–111)
CO2: 25 mmol/L (ref 22–32)
Calcium: 9.6 mg/dL (ref 8.9–10.3)
Creatinine, Ser: 1.36 mg/dL — ABNORMAL HIGH (ref 0.44–1.00)
GFR calc Af Amer: 52 mL/min — ABNORMAL LOW (ref >60)
GFR calc non Af Amer: 45 mL/min — ABNORMAL LOW (ref >60)
GLUCOSE: 164 mg/dL — AB (ref 65–99)
POTASSIUM: 4 mmol/L (ref 3.5–5.1)
Sodium: 139 mmol/L (ref 135–145)
TOTAL PROTEIN: 8.1 g/dL (ref 6.5–8.1)

## 2015-11-02 LAB — CBC
HEMATOCRIT: 43.4 % (ref 36.0–46.0)
HEMOGLOBIN: 14.4 g/dL (ref 12.0–15.0)
MCH: 27.1 pg (ref 26.0–34.0)
MCHC: 33.2 g/dL (ref 30.0–36.0)
MCV: 81.7 fL (ref 78.0–100.0)
PLATELETS: 375 10*3/uL (ref 150–400)
RBC: 5.31 MIL/uL — AB (ref 3.87–5.11)
RDW: 15.3 % (ref 11.5–15.5)
WBC: 7 10*3/uL (ref 4.0–10.5)

## 2015-11-02 LAB — PROTIME-INR
INR: 1.2
PROTHROMBIN TIME: 15.3 s — AB (ref 11.4–15.2)

## 2015-11-02 LAB — APTT: aPTT: 30 seconds (ref 24–36)

## 2015-11-02 MED ORDER — HYDRALAZINE HCL 20 MG/ML IJ SOLN
10.0000 mg | INTRAMUSCULAR | Status: DC
  Filled 2015-11-02: qty 1

## 2015-11-02 NOTE — ED Notes (Signed)
Dr. Miller at the bedside.  

## 2015-11-02 NOTE — ED Triage Notes (Signed)
The pt is c/o dizziness and elevated bp for 2-3 days.  Numbness in nher lt face lt arm and lt leg for 30 minutes.  Pt discussed with drf rees  No code stroke to be called per dr Ralene Bathe

## 2015-11-02 NOTE — ED Provider Notes (Signed)
[facility] DEPT Provider Note   CSN: [id number] Arrival date & time: 11/02/15  1537  First Provider Contact:  First MD Initiated Contact with Patient 11/02/15 2028        History   Chief Complaint Chief Complaint  Patient presents with  . Dizziness    HPI Gail Barr is a 50 y.o. female.  2-3 days of visual changes - she states that she has trouble reading a screen or feeling that people look like shadows - tingling on the L side of the face.  No imbalance and no vertigo.  Had L side neuro sx a decade ago - no neuro sx since that time.  She has no other symptoms.  Wt to her new PCP on Monday (urgent care) and had high Blood sugar and hypertension.  She was treated with bactrim for opssible UTI - changed her anti diabetics and upped her synthroid (Monday).  Neuro Sx are constant though it fluctuates in intensity      Past Medical History:  Diagnosis Date  . Abnormal Pap smear early 2000's   had colposcopy, no additional treatment. normal paps since  . Diabetes mellitus without complication ([place])   . Hypothyroidism 1993  . Multiple thyroid nodules   . Pyelonephritis 1993   when pregnant; subsequently found to have shrunken, nonfunctioning kidney on one side    Patient Active Problem List   Diagnosis Date Noted  . Mixed hyperlipidemia 02/12/2015  . Type 2 diabetes mellitus, uncontrolled, with renal complications ([place]) 123456  . Microalbuminuria 04/04/2014  . Chest pain 01/25/2014  . 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 Bifascicular block 01/25/2014  . New onset type 2 diabetes mellitus ([place]) 12/23/2013  . Costochondritis 01/10/2013  . Abnormal menses 01/10/2013  . Vitamin D deficiency 07/08/2011  . Hypothyroidism 07/07/2011  . Obesity, Class III, BMI 40-49.9 (morbid obesity) ([place]) 07/07/2011    Past Surgical History:  Procedure Laterality Date  . DILITATION & CURRETTAGE/HYSTROSCOPY WITH NOVASURE ABLATION N/A 03/02/2013   Procedure: DILATATION &  CURETTAGE/HYSTEROSCOPY WITH NOVASURE ABLATION;  Surgeon: Olga Millers, MD;  Location: [facility];  Service: Gynecology;  Laterality: N/A;    OB History    Gravida Para Term Preterm AB Living   6 3     3 3    SAB TAB Ectopic Multiple Live Births   3               Home Medications    Prior to Admission medications   Medication Sig Start Date End Date Taking? Authorizing Provider  Canagliflozin-Metformin HCl ER (INVOKAMET XR) 50-500 MG TB24 Take 1 tablet by mouth daily.   Yes Historical Provider, MD  levothyroxine (SYNTHROID, LEVOTHROID) 200 MCG tablet Take 200 mcg by mouth daily.   Yes Historical Provider, MD  Multiple Vitamin (MULTIVITAMIN WITH MINERALS) TABS tablet Take by mouth See admin instructions. [place] packet of multiple vitamins daily   Yes Historical Provider, MD  sulfamethoxazole-trimethoprim (BACTRIM DS,SEPTRA DS) 800-160 MG tablet Take 1 tablet by mouth every 12 (twelve) hours. 5 day course started 10/29/15 10/28/15  Yes Historical Provider, MD  atorvastatin (LIPITOR) 20 MG tablet Take 1 tablet (20 mg total) by mouth daily. Patient not taking: Reported on 11/02/2015 02/12/15   Rita Ohara, MD  dapagliflozin propanediol (FARXIGA) 10 MG TABS tablet Take 10 mg by mouth daily. Patient not taking: Reported on 11/02/2015 02/12/15   Rita Ohara, MD  glucose blood test strip THIS IS FOR  ONE TOUCH VERIO PATIENT IS TO TEST BID Use  as instructed 01/25/15   Rita Ohara, MD  lisinopril (PRINIVIL,ZESTRIL) 2.5 MG tablet Take 1 tablet (2.5 mg total) by mouth daily. Patient not taking: Reported on 11/02/2015 04/05/14   Rita Ohara, MD  metFORMIN (GLUCOPHAGE-XR) 500 MG 24 hr tablet Take 2 tablet by mouth before breakfast and 1 before dinner Patient not taking: Reported on 11/02/2015 02/19/15   Rita Ohara, MD  [facility] DELICA LANCETS FINE MISC 1 each by Does not apply route 2 (two) times daily. 12/29/13   Rita Ohara, MD  SYNTHROID 175 MCG tablet Take 1 tablet (175 mcg total) by mouth daily before breakfast. Patient  not taking: Reported on 11/02/2015 02/12/15   Rita Ohara, MD    Family History Family History  Problem Relation Age of Onset  . Asthma Brother   . Diabetes Maternal Aunt   . Diabetes Maternal Uncle   . Diabetes Maternal Grandmother   . Cancer Maternal Grandfather     prostate cancer  . Asthma Brother     Social History Social History  Substance Use Topics  . Smoking status: Never Smoker  . Smokeless tobacco: Never Used  . Alcohol use Yes     Comment: one drink every three months.     Allergies   Advil [ibuprofen]; Betadine [povidone iodine]; and Iohexol   Review of Systems Review of Systems  All other systems reviewed and are negative.    Physical Exam Updated Vital Signs BP 129/63   Pulse 63   Temp 98.8 F (37.1 C)   Resp 18   Ht 5\' 8"  (1.727 m)   Wt 270 lb (122.5 kg)   LMP 10/31/2015   SpO2 98%   BMI 41.05 kg/m   Physical Exam  Constitutional: She appears well-developed and well-nourished. No distress.  HENT:  Head: Normocephalic and atraumatic.  Mouth/Throat: Oropharynx is clear and moist. No oropharyngeal exudate.  Eyes: Conjunctivae and EOM are normal. Pupils are equal, round, and reactive to light. Right eye exhibits no discharge. Left eye exhibits no discharge. No scleral icterus.  Neck: Normal range of motion. Neck supple. No JVD present. No thyromegaly present.  Cardiovascular: Normal rate, regular rhythm, normal heart sounds and intact distal pulses.  Exam reveals no gallop and no friction rub.   No murmur heard. Pulmonary/Chest: Effort normal and breath sounds normal. No respiratory distress. She has no wheezes. She has no rales.  Abdominal: Soft. Bowel sounds are normal. She exhibits no distension and no mass. There is no tenderness.  Musculoskeletal: Normal range of motion. She exhibits no edema or tenderness.  Lymphadenopathy:    She has no cervical adenopathy.  Neurological: She is alert. Coordination normal.  Neurologic exam:  Speech  clear, pupils equal round reactive to light, extraocular movements intact  Normal peripheral visual fields Cranial nerves III through XII normal including no facial droop Follows commands, moves all extremities x4, normal strength to bilateral upper and lower extremities at all major muscle groups including grip Sensation normal to light touch and pinprick Coordination intact, no limb ataxia, finger-nose-finger normal Rapid alternating movements normal No pronator drift Gait normal   Skin: Skin is warm and dry. No rash noted. No erythema.  Psychiatric: She has a normal mood and affect. Her behavior is normal.  Nursing note and vitals reviewed.    ED Treatments / Results  Labs (all labs ordered are listed, but only abnormal results are displayed) Labs Reviewed  PROTIME-INR - Abnormal; Notable for the following:       Result Value  Prothrombin Time 15.3 (*)    All other components within normal limits  CBC - Abnormal; Notable for the following:    RBC 5.31 (*)    All other components within normal limits  COMPREHENSIVE METABOLIC PANEL - Abnormal; Notable for the following:    Glucose, Bld 164 (*)    Creatinine, Ser 1.36 (*)    GFR calc non Af Amer 45 (*)    GFR calc Af Amer 52 (*)    All other components within normal limits  CBG MONITORING, ED - Abnormal; Notable for the following:    Glucose-Capillary 171 (*)    All other components within normal limits  I-STAT CHEM 8, ED - Abnormal; Notable for the following:    Creatinine, Ser 1.30 (*)    Glucose, Bld 165 (*)    Hemoglobin 15.3 (*)    All other components within normal limits  APTT  DIFFERENTIAL  TROPONIN I  CBG MONITORING, ED    EKG  EKG Interpretation  Date/Time:  Friday November 02 2015 15:43:27 EDT Ventricular Rate:  103 PR Interval:  138 QRS Duration: 126 QT Interval:  352 QTC Calculation: 461 R Axis:   -87 Text Interpretation:  Sinus tachycardia Right bundle branch block Left anterior fascicular block   Bifascicular block  Minimal voltage criteria for LVH, may be normal variant Lateral infarct , age undetermined Cannot rule out Inferior infarct , age undetermined Abnormal ECG Confirmed by Hazle Coca [phone]) on 11/02/2015 3:49:01 PM       Radiology Ct Head Wo Contrast  Result Date: 11/02/2015 CLINICAL DATA:  50 year old female with a history of left arm numbness EXAM: CT HEAD WITHOUT CONTRAST TECHNIQUE: Contiguous axial images were obtained from the base of the skull through the vertex without intravenous contrast. COMPARISON:  CT 06/24/2007, MR 03/08/2013 FINDINGS: Unremarkable appearance of the calvarium without acute fracture or aggressive lesion. Unremarkable appearance of the scalp soft tissues. Unremarkable appearance of the bilateral orbits. Mastoid air cells are clear. No significant paranasal sinus disease No acute intracranial hemorrhage, midline shift, or mass effect. Gray-white differentiation is maintained, without CT evidence of acute ischemia. Unremarkable configuration of the ventricles. IMPRESSION: No CT evidence of acute intracranial abnormality. Signed, Dulcy Fanny. Earleen Newport, DO [facility] Radiology Electronically Signed   By: Corrie Mckusick D.O.   On: 11/02/2015 16:24   Mr Brain Wo Contrast  Result Date: 11/03/2015 CLINICAL DATA:  Initial evaluation for acute dizziness, elevated blood pressure for 2-3 days. Also with numbness in left face, arm, and leg. EXAM: MRI HEAD WITHOUT CONTRAST TECHNIQUE: Multiplanar, multiecho pulse sequences of the brain and surrounding structures were obtained without intravenous contrast. COMPARISON:  Prior CT from earlier the same day. FINDINGS: Cerebral volume within normal limits for patient age. Mild scattered nonspecific cerebral white matter disease present, felt to be within normal limits for patient age. No abnormal foci of restricted diffusion to suggest acute ischemia. Gray-white matter differentiation  maintained. Normal intracranial vascular flow voids are preserved. No acute or chronic intracranial hemorrhage. No areas of chronic infarction. No mass lesion, midline shift or mass effect. No hydrocephalus. No extra-axial fluid collection. Major dural sinuses are grossly patent. Craniocervical junction normal. Visualized upper cervical spine unremarkable. Pituitary gland normal. No acute abnormality about the globes and orbits. Paranasal sinuses and mastoid air cells are clear. Inner ear structures grossly normal. Bone marrow signal intensity within normal limits. No scalp soft tissue abnormality. IMPRESSION: Normal brain MRI.  No acute intracranial process identified. Electronically  Signed   By: Jeannine Boga M.D.   On: 11/03/2015 00:38    Procedures Procedures (including critical care time)  Medications Ordered in ED Medications - No data to display   Initial Impression / Assessment and Plan / ED Course  I have reviewed the triage vital signs and the nursing notes.  Pertinent labs & imaging results that were available during my care of the patient were reviewed by me and considered in my medical decision making (see chart for details).  Clinical Course  Comment By Time  Has no acute findings on exam - needs MR to r/o PRESS or other brain pathology - consider ocular migraine as well. Noemi Chapel, MD 08/11 2210   MRI normal - pt informed - stable for d/c   Final Clinical Impressions(s) / ED Diagnoses   Final diagnoses:  None    New Prescriptions New Prescriptions   No medications on file     Noemi Chapel, MD 11/03/15 [phone]

## 2015-11-02 NOTE — ED Triage Notes (Signed)
No facial droop  Equal hand grips  No arm or leg drift    She drove herself here

## 2015-11-03 NOTE — Discharge Instructions (Signed)

## 2015-11-29 ENCOUNTER — Ambulatory Visit (INDEPENDENT_AMBULATORY_CARE_PROVIDER_SITE_OTHER): Payer: 59 | Admitting: Neurology

## 2015-11-29 ENCOUNTER — Encounter: Payer: Self-pay | Admitting: Neurology

## 2015-11-29 VITALS — BP 154/96 | HR 70 | Ht 68.0 in | Wt 265.6 lb

## 2015-11-29 DIAGNOSIS — H8309 Labyrinthitis, unspecified ear: Secondary | ICD-10-CM

## 2015-11-29 DIAGNOSIS — R42 Dizziness and giddiness: Secondary | ICD-10-CM | POA: Diagnosis not present

## 2015-11-29 DIAGNOSIS — H8110 Benign paroxysmal vertigo, unspecified ear: Secondary | ICD-10-CM

## 2015-11-29 MED ORDER — METHYLPREDNISOLONE 4 MG PO TBPK: ORAL_TABLET | ORAL | 1 refills | Status: DC

## 2015-11-29 MED ORDER — MECLIZINE HCL 25 MG PO TABS: 25.0000 mg | ORAL_TABLET | Freq: Two times a day (BID) | ORAL | 0 refills | Status: DC | PRN

## 2015-11-29 NOTE — Patient Instructions (Addendum)
Remember to drink plenty of fluid, eat healthy meals and do not skip any meals. Try to eat protein with a every meal and eat a healthy snack such as fruit or nuts in between meals. Try to keep a regular sleep-wake schedule and try to exercise daily, particularly in the form of walking, 20-30 minutes a day, if you can.   As far as your medications are concerned, I would like to suggest: Meclizine take twice a day for one week then as needed. Take steroids for 6 days.  As far as diagnostic testing: Vestibular therapy  I would like to see you back if needed, sooner if we need to. Please call us with any interim questions, concerns, problems, updates or refill requests.   Our phone number is [phone]. We also have an after hours call service for urgent matters and there is a physician on-call for urgent questions. For any emergencies you know to call 911 or go to the nearest emergency room  Methylprednisolone tablets What is this medicine? METHYLPREDNISOLONE (meth ill pred NISS oh lone) is a corticosteroid. It is commonly used to treat inflammation of the skin, joints, lungs, and other organs. Common conditions treated include asthma, allergies, and arthritis. It is also used for other conditions, such as blood disorders and diseases of the adrenal glands. This medicine may be used for other purposes; ask your health care provider or pharmacist if you have questions. What should I tell my health care provider before I take this medicine? They need to know if you have any of these conditions: -Cushing's syndrome -diabetes -glaucoma -heart problems or disease -high blood pressure -infection such as herpes, measles, tuberculosis, or chickenpox -kidney disease -liver disease -mental problems -myasthenia gravis -osteoporosis -seizures -stomach ulcer or intestine disease including colitis and diverticulitis -thyroid problem -an unusual or allergic reaction to lactose, methylprednisolone,  other medicines, foods, dyes, or preservatives -pregnant or trying to get pregnant -breast-feeding How should I use this medicine? Take this medicine by mouth with a drink of water. Follow the directions on the prescription label. Take it with food or milk to avoid stomach upset. If you are taking this medicine once a day, take it in the morning. Do not take more medicine than you are told to take. Do not suddenly stop taking your medicine because you may develop a severe reaction. Your doctor will tell you how much medicine to take. If your doctor wants you to stop the medicine, the dose may be slowly lowered over time to avoid any side effects. Talk to your pediatrician regarding the use of this medicine in children. Special care may be needed. Overdosage: If you think you have taken too much of this medicine contact a poison control center or emergency room at once. NOTE: This medicine is only for you. Do not share this medicine with others. What if I miss a dose? If you miss a dose, take it as soon as you can. If it is almost time for your next dose, talk to your doctor or health care professional. You may need to miss a dose or take an extra dose. Do not take double or extra doses without advice. What may interact with this medicine? Do not take this medicine with any of the following medications: -mifepristone This medicine may also interact with the following medications: -tacrolimus -vaccines -warfarin This list may not describe all possible interactions. Give your health care provider a list of all the medicines, herbs, non-prescription drugs, or dietary supplements you  use. Also tell them if you smoke, drink alcohol, or use illegal drugs. Some items may interact with your medicine. What should I watch for while using this medicine? Visit your doctor or health care professional for regular checks on your progress. If you are taking this medicine for a long time, carry an identification  card with your name and address, the type and dose of your medicine, and your doctor's name and address. The medicine may increase your risk of getting an infection. Stay away from people who are sick. Tell your doctor or health care professional if you are around anyone with measles or chickenpox. If you are going to have surgery, tell your doctor or health care professional that you have taken this medicine within the last twelve months. Ask your doctor or health care professional about your diet. You may need to lower the amount of salt you eat. The medicine can increase your blood sugar. If you are a diabetic check with your doctor if you need help adjusting the dose of your diabetic medicine. What side effects may I notice from receiving this medicine? Side effects that you should report to your doctor or health care professional as soon as possible: -allergic reactions like skin rash, itching or hives, swelling of the face, lips, or tongue -eye pain, decreased or blurred vision, or bulging eyes -fever, sore throat, sneezing, cough, or other signs of infection, wounds that will not heal -increased thirst -mental depression, mood swings, mistaken feelings of self importance or of being mistreated -pain in hips, back, ribs, arms, shoulders, or legs -swelling of the ankles, feet, hands -trouble passing urine or change in the amount of urine Side effects that usually do not require medical attention (report to your doctor or health care professional if they continue or are bothersome): -confusion, excitement, restlessness -headache -nausea, vomiting -skin problems, acne, thin and shiny skin -weight gain This list may not describe all possible side effects. Call your doctor for medical advice about side effects. You may report side effects to FDA at 1-800-FDA-1088. Where should I keep my medicine? Keep out of the reach of children. Store at room temperature between 20 and 25 degrees C (68 and  77 degrees F). Throw away any unused medicine after the expiration date. NOTE: This sheet is a summary. It may not cover all possible information. If you have questions about this medicine, talk to your doctor, pharmacist, or health care provider.    2016, Elsevier/[employer] Standard. (2011-12-09 11:38:34)   Meclizine tablets or capsules What is this medicine? MECLIZINE (MEK li zeen) is an antihistamine. It is used to prevent nausea, vomiting, or dizziness caused by motion sickness. It is also used to prevent and treat vertigo (extreme dizziness or a feeling that you or your surroundings are tilting or spinning around). This medicine may be used for other purposes; ask your health care provider or pharmacist if you have questions. What should I tell my health care provider before I take this medicine? They need to know if you have any of these conditions: -glaucoma -lung or breathing disease, like asthma -problems urinating -prostate disease -stomach or intestine problems -an unusual or allergic reaction to meclizine, other medicines, foods, dyes, or preservatives -pregnant or trying to get pregnant -breast-feeding How should I use this medicine? Take this medicine by mouth with a glass of water. Follow the directions on the prescription label. If you are using this medicine to prevent motion sickness, take the dose at least 1 hour before  travel. If it upsets your stomach, take it with food or milk. Take your doses at regular intervals. Do not take your medicine more often than directed. Talk to your pediatrician regarding the use of this medicine in children. Special care may be needed. Overdosage: If you think you have taken too much of this medicine contact a poison control center or emergency room at once. NOTE: This medicine is only for you. Do not share this medicine with others. What if I miss a dose? If you miss a dose, take it as soon as you can. If it is almost time for your next  dose, take only that dose. Do not take double or extra doses. What may interact with this medicine? Do not take this medicine with any of the following medications: -MAOIs like Carbex, Eldepryl, Marplan, Nardil, and Parnate This medicine may also interact with the following medications: -alcohol -antihistamines for allergy, cough and cold -certain medicines for anxiety or sleep -certain medicines for depression, like amitriptyline, fluoxetine, sertraline -certain medicines for seizures like phenobarbital, primidone -general anesthetics like halothane, isoflurane, methoxyflurane, propofol -local anesthetics like lidocaine, pramoxine, tetracaine -medicines that relax muscles for surgery -narcotic medicines for pain -phenothiazines like chlorpromazine, mesoridazine, prochlorperazine, thioridazine This list may not describe all possible interactions. Give your health care provider a list of all the medicines, herbs, non-prescription drugs, or dietary supplements you use. Also tell them if you smoke, drink alcohol, or use illegal drugs. Some items may interact with your medicine. What should I watch for while using this medicine? Tell your doctor or healthcare professional if your symptoms do not start to get better or if they get worse. You may get drowsy or dizzy. Do not drive, use machinery, or do anything that needs mental alertness until you know how this medicine affects you. Do not stand or sit up quickly, especially if you are an older patient. This reduces the risk of dizzy or fainting spells. Alcohol may interfere with the effect of this medicine. Avoid alcoholic drinks. Your mouth may get dry. Chewing sugarless gum or sucking hard candy, and drinking plenty of water may help. Contact your doctor if the problem does not go away or is severe. This medicine may cause dry eyes and blurred vision. If you wear contact lenses you may feel some discomfort. Lubricating drops may help. See your eye  doctor if the problem does not go away or is severe. What side effects may I notice from receiving this medicine? Side effects that you should report to your doctor or health care professional as soon as possible: -feeling faint or lightheaded, falls -fast, irregular heartbeat Side effects that usually do not require medical attention (report these to your doctor or health care professional if they continue or are bothersome): -constipation -headache -trouble passing urine or change in the amount of urine -trouble sleeping -upset stomach This list may not describe all possible side effects. Call your doctor for medical advice about side effects. You may report side effects to FDA at 1-800-FDA-1088. Where should I keep my medicine? Keep out of the reach of children. Store at room temperature between 15 and 30 degrees C (59 and 86 degrees F). Keep container tightly closed. Throw away any unused medicine after the expiration date. NOTE: This sheet is a summary. It may not cover all possible information. If you have questions about this medicine, talk to your doctor, pharmacist, or health care provider.    2016, Elsevier/Gold Standard. (2014-09-14 09:20:57)

## 2015-11-29 NOTE — Progress Notes (Signed)
[facility]    Provider:  Dr Jaynee Eagles Referring Provider: Everardo Beals, NP Primary Care Physician:  Pcp Not In System  CC:  dizziness  HPI:  Gail Barr is a 50 y.o. female here as a referral from Dr. Benna Dunks for vertigo. Past medical history morbid obesity, diabetes, hypertension, hypothyroidism. She was sitting at the desk at work in July and tongue went num and tingling and left arm. She has had an episode of left-sided numbness and tingling. She has episodes of left hand tingling at night or with use that is separate, she feels that sometimes. In July she felt very dizzy, then she started getting double vision, for 15-20 minutes, it worsened, she went to urgent care, blood pressure was 258/101. Blood sugar was 399. HTN resolved, dizziness never did and she is routinely dizzy daily since then. Objects look blurry, "shadowy", especially in bright lights. She has not driven since July due to vision changes and dizziness. She was driving with her grandson at the end of July and she had to pull over because she got dizzy. No room spinning just light headed. She is getting nausea recently as well. Sunday was a bad day because of the dizziness, no room spinning, no los sof balance, no hearing problems, she does not have headaches maybe occasisonally but they are not related to her current symptoms, still blurry vision, she checked her BP and her BP was fine. Dizziness persistent, sitting, standing, laying, walking, her brain does not feel right. Closing her eyes and sitting still helps. Very uncomfortable. No ear pain or ear fullness. She rarely has headaches, she used to have migraines younger. No other associates symptoms or triggering factors or inciting events. No changes in medications or recent illnesses before onset of symptoms.  Reviewed notes, labs and imaging from outside physicians, which showed:  Hemoglobin A1c 8 04/23/2015, creatinine 1.3 11/02/2015, TSH 18.6  04/23/2015  Personally reviewed MRI of the brain images and agree with the following: Cerebral volume within normal limits for patient age. Mild scattered nonspecific cerebral white matter disease present, felt to be within normal limits for patient age.  No abnormal foci of restricted diffusion to suggest acute ischemia. Gray-white matter differentiation maintained. Normal intracranial vascular flow voids are preserved. No acute or chronic intracranial hemorrhage. No areas of chronic infarction.  No mass lesion, midline shift or mass effect. No hydrocephalus. No extra-axial fluid collection. Major dural sinuses are grossly patent.  Craniocervical junction normal. Visualized upper cervical spine unremarkable.  Pituitary gland normal. No acute abnormality about the globes and orbits.  Paranasal sinuses and mastoid air cells are clear. Inner ear structures grossly normal.  Bone marrow signal intensity within normal limits. No scalp soft tissue abnormality.  IMPRESSION: Normal brain MRI.  No acute intracranial process identified.  Review of Systems: Patient complains of symptoms per HPI as well as the following symptoms: Chills, fatigue, blurred vision, double vision, swelling in legs, flushing, aching muscles, headache, numbness, weakness, dizziness, not enough sleep, decreased energy, change in appetite. Pertinent negatives per HPI. All others negative.   Social History   Social History  . Marital status: Married    Spouse name: Dub Mikes  . Number of children: 3  . Years of education: 24   Occupational History  . 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 Smoking status: Never Smoker  . Smokeless tobacco: Never Used  . Alcohol use Yes     Comment: one drink every three months.  Marland Kitchen  Drug use: No  . Sexual activity: Yes    Partners: Male    Birth control/ protection: None   Other Topics Concern  . Not on file   Social History Narrative   Lives with  husband   Son is a police office in [place], has 1 grandchild.     Daughter is a Presenter, broadcasting in [place].     She moved here in 2005 from [place]   Caffeine use: none    Family History  Problem Relation Age of Onset  . Asthma Brother   . Diabetes Maternal Aunt   . Diabetes Maternal Uncle   . Diabetes Maternal Grandmother   . Cancer Maternal Grandfather     prostate cancer  . Asthma Brother     Past Medical History:  Diagnosis Date  . Abnormal Pap smear early 2000's   had colposcopy, no additional treatment. normal paps since  . Diabetes mellitus without complication ([place])   . Hypothyroidism 1993  . Multiple thyroid nodules   . 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 DILITATION & CURRETTAGE/HYSTROSCOPY WITH NOVASURE ABLATION N/A 03/02/2013   Procedure: DILATATION & CURETTAGE/HYSTEROSCOPY WITH NOVASURE ABLATION;  Surgeon: Olga Millers, MD;  Location: [facility];  Service: Gynecology;  Laterality: N/A;    Current Outpatient Prescriptions  Medication Sig Dispense Refill  . Canagliflozin-Metformin HCl ER (INVOKAMET XR) 50-500 MG TB24 Take 1 tablet by mouth daily.    Marland Kitchen HYDROcodone-homatropine (HYCODAN) 5-1.5 MG/5ML syrup     . INVOKAMET 50-1000 MG TABS Take 1 tablet by mouth 2 (two) times daily.  2  . SYNTHROID 175 MCG tablet Take 1 tablet (175 mcg total) by mouth daily before breakfast. 30 tablet 2  . meclizine (ANTIVERT) 25 MG tablet Take 1 tablet (25 mg total) by mouth 2 (two) times daily as needed for dizziness. Take twice a day for one week then as needed. 60 tablet 0  . methylPREDNISolone (MEDROL DOSEPAK) 4 MG TBPK tablet follow package directions 21 tablet 1   No current facility-administered medications for this visit.     Allergies as of 11/29/2015 - Review Complete 11/02/2015  Allergen Reaction Noted  . Advil [ibuprofen] Shortness Of Breath  and Swelling 01/26/2013  . Betadine [povidone iodine]  03/02/2013  . Iohexol  11/10/2005    Vitals: BP (!) 154/96 (BP Location: Left Wrist, Patient Position: Sitting, Cuff Size: Normal)   Pulse 70   Ht 5\' 8"  (1.727 m)   Wt 265 lb 9.6 oz (120.5 kg)   LMP 10/31/2015   BMI 40.38 kg/m  Last Weight:  Wt Readings from Last 1 Encounters:  11/29/15 265 lb 9.6 oz (120.5 kg)   Last Height:   Ht Readings from Last 1 Encounters:  11/29/15 5\' 8"  (1.727 m)   .ophth Physical exam: Exam: Gen: NAD, conversant, well nourised, obese, well groomed                     CV: RRR, no MRG. No Carotid Bruits. No peripheral edema, warm, nontender Eyes: Conjunctivae clear without exudates or hemorrhage  Neuro: Detailed Neurologic Exam  Speech:    Speech is normal; fluent and spontaneous with normal comprehension.  Cognition:    The patient is oriented to person, place, and time;     recent and remote memory intact;     language fluent;     normal  attention, concentration,     fund of knowledge Cranial Nerves:    The pupils are equal, round, and reactive to light. The fundi are normal and spontaneous venous pulsations are present. Visual fields are full to finger confrontation. Extraocular movements are intact. Trigeminal sensation is intact and the muscles of mastication are normal. The face is symmetric. The palate elevates in the midline. Hearing intact. Voice is normal. Shoulder shrug is normal. The tongue has normal motion without fasciculations.   Coordination:    Normal finger to nose and heel to shin. Normal rapid alternating movements.   Gait:    Heel-toe and tandem gait are normal.   Motor Observation:    No asymmetry, no atrophy, and no involuntary movements noted. Tone:    Normal muscle tone.    Posture:    Posture is normal. normal erect    Strength:    Strength is V/V in the upper and lower limbs.      Sensation: intact to LT     Reflex Exam:  DTR's:    Deep tendon  reflexes in the upper and lower extremities are normal bilaterally.   Toes:    The toes are downgoing bilaterally.   Clonus:    Clonus is absent.  Did not elicit nystagmuas but she did feel dizzy on dix hallpike.      Assessment/Plan:  50 year old with persistent dizziness and perceived vision changes. MRI of the brain unremarkable. Neuro exam non focal.   Remember to drink plenty of fluid, eat healthy meals and do not skip any meals. Try to eat protein with a every meal and eat a healthy snack such as fruit or nuts in between meals. Try to keep a regular sleep-wake schedule and try to exercise daily, particularly in the form of walking, 20-30 minutes a day, if you can.   As far as your medications are concerned, I would like to suggest: Meclizine take twice a day for one week then as needed. Take steroids for 6 days. Discussed side effects as per patient instructions.   As far as diagnostic testing: Vestibular therapy  If symptoms persist after vestibular therapy recommend MRA of the head.   Our phone number is [phone]. We also have an after hours call service for urgent matters and there is a physician on-call for urgent questions. For any emergencies you know to call 911 or go to the nearest emergency room  Cc: Dr. Benna Dunks  Sarina Ill, [facility] [address] [place], [place] [address]  Phone [phone] Fax [phone]

## 2015-12-02 DIAGNOSIS — R42 Dizziness and giddiness: Secondary | ICD-10-CM | POA: Insufficient documentation

## 2015-12-02 NOTE — Addendum Note (Signed)
Addended by: Sarina Ill B on: 12/02/2015 08:18 PM   Modules accepted: Orders

## 2015-12-04 ENCOUNTER — Telehealth: Payer: Self-pay | Admitting: Neurology

## 2015-12-04 NOTE — Telephone Encounter (Signed)
Pt called in requesting a letter taking her out of work. Also would like update on therapy. She states she just started medication yesterday because the pharmacy just received it. Please call [phone]

## 2015-12-04 NOTE — Telephone Encounter (Signed)
Called and LVM for pt. Relayed Dr Jaynee Eagles note. Advised I will put up front for her to pick up letter. Gave [facility] phone number if she has further questions.

## 2015-12-04 NOTE — Telephone Encounter (Signed)
Called and LVM for pt returning call. Gave [facility] phone number.

## 2015-12-04 NOTE — Telephone Encounter (Signed)
Dr Jaynee Eagles- please advise  Called patient back. She states she has bronchitis right now.  I asked patient why she is requesting Dr Jaynee Eagles to write her out of work. She stated previous doctor did FMLA paperwork for her and wrote her out through 11/29/15 until Dr Jaynee Eagles saw her. She states she has been sick sick since July. She has been dizzy, nausea, weakness. 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 Also gets foggy and cannot work on Teaching laboratory technician. She stated "I love my job and want to work but cannot work right now".   She went for appt yesterday to get FMLA renewed but her previous doctor told her Dr Jaynee Eagles would have to since she was the one following her now. She states she only needs a letter from Dr Jaynee Eagles writing her out of work, she does not need FMLA paperwork since it was filled out again. Advised they may want her to have this filled out again. Advised I will have to speak to Dr Jaynee Eagles and call her back to advise. Up to Dr Jaynee Eagles if she wants to extend this.   I also asked patient why she just got her medication. Dr Jaynee Eagles sent in rx on  11/29/15. She stated she went to pick up medication that day but [facility] told her it was not ready and they would call her the next day once ready. She never received a call. [facility] called on Saturday to say medication was ready but she missed this call. She picked up medication yesterday. She just started her medication.

## 2015-12-04 NOTE — Telephone Encounter (Signed)
I can write a note for several days of work up to a week at the most. Please write something and get it to me. thanks

## 2015-12-10 ENCOUNTER — Telehealth: Payer: Self-pay | Admitting: Neurology

## 2015-12-10 NOTE — Telephone Encounter (Signed)
Dr Jaynee Eagles--  Called patient back. She stated she has heard about scheduling for vestibular therapy but their soonest available was October 5th. I asked her to call back and see if she can be put on their cancellation list to be seen sooner if someone cancels. She verbalized understanding. I gave her number to call [facility] [phone].  She will let us know what they say. She tried taking meclizine again after I spoke to her earlier but she states she got no relief. She is still laying down.

## 2015-12-10 NOTE — Telephone Encounter (Signed)
I placed referral for vestibular rehab, she needs to schedule an appt with them asap Im sure she has called please check thanks

## 2015-12-10 NOTE — Telephone Encounter (Signed)
Patient is calling stating medications meclizine (ANTIVERT) 25 MG tablet and methylPREDNISolone (MEDROL DOSEPAK) 4 MG TBPK tablet has not helped with her dizziness. She says she has a seasick feeling. Please call to discuss.

## 2015-12-10 NOTE — Telephone Encounter (Signed)
Pt called said Oct 5th is earliest appt for rehab but they will put her on wait list. Also she will need a letter for wrk stating she will need to be out of work until Oct 5th.

## 2015-12-10 NOTE — Telephone Encounter (Signed)
Per Dr Jaynee Eagles- she needs to come in for appt with NP this week since sx have worsened to be evaluated.

## 2015-12-10 NOTE — Telephone Encounter (Signed)
Dr Jaynee Eagles- please advise  Called patient back. She said this past Friday she felt the best.  She then threw up all night on Friday. Yesterday she took shower in the morning and started feeling dizzy and sick again. She could not keep any food down. Felt sea sick/Vomiting. She states she was  "Dizzy to no end".   She is in bed today. Has to close her eyes and lay down. She feels a little better. But has to lay slanted up a little. She states Ginger ale helps but she cannot keep this down. She throws it back up.   Took meclizine last on Friday. She stated she was all out of medication. I asked her to double check because she was given 60 tablets. She checked while I had her on the phone and she stated she was mistaken and did have some left. I instructed her to try and take this to see if it helps her sx while I talk to Dr Jaynee Eagles.   She has finished medrol dosepak but this did not help per patient.  Advised I will send message to Dr Jaynee Eagles and call her back to advise. She verbalized understanding.

## 2015-12-10 NOTE — Telephone Encounter (Signed)
Dr Ahern/Carolyn-- Gail Barr  Called patient back. Per Dr Jaynee Eagles, since sx have worsened and meclizine not helping, she needs to come in for f/u. Scheduled f/u with CM, NP at 245pm, check in 230pm. Ok to schedule with NP per Dr Jaynee Eagles. Pt verbalized understanding.

## 2015-12-11 ENCOUNTER — Encounter: Payer: Self-pay | Admitting: Nurse Practitioner

## 2015-12-11 ENCOUNTER — Ambulatory Visit (INDEPENDENT_AMBULATORY_CARE_PROVIDER_SITE_OTHER): Payer: 59 | Admitting: Nurse Practitioner

## 2015-12-11 ENCOUNTER — Encounter: Payer: Self-pay | Admitting: *Deleted

## 2015-12-11 ENCOUNTER — Telehealth: Payer: Self-pay | Admitting: Nurse Practitioner

## 2015-12-11 VITALS — BP 118/82 | HR 76 | Ht 68.0 in | Wt 265.4 lb

## 2015-12-11 DIAGNOSIS — G450 Vertebro-basilar artery syndrome: Secondary | ICD-10-CM | POA: Insufficient documentation

## 2015-12-11 DIAGNOSIS — R112 Nausea with vomiting, unspecified: Secondary | ICD-10-CM | POA: Insufficient documentation

## 2015-12-11 DIAGNOSIS — R42 Dizziness and giddiness: Secondary | ICD-10-CM

## 2015-12-11 MED ORDER — ONDANSETRON HCL 4 MG PO TABS: 4.0000 mg | ORAL_TABLET | Freq: Three times a day (TID) | ORAL | 3 refills | Status: DC | PRN

## 2015-12-11 NOTE — Telephone Encounter (Signed)
PT NEEDS MRA PER CM. PT IS [facility] OF [facility]. ORDER NOT THERE

## 2015-12-11 NOTE — Progress Notes (Addendum)
[facility]  PATIENT: Gail Barr DOB: [birth year]   REASON FOR VISIT: Follow-up for dizziness and vomiting nausea and balance HISTORY FROM: Patient    HISTORY OF PRESENT ILLNESS:UPDATE 12/11/2015 CM. Gail Barr, 50 year old female returns for follow-up. She continues to have dizziness and vomiting nausea and feeling of being off balance. She also has some blurred vision. She denies that the room is spinning. Meclizine has not been helpful. She has been unable to work. She has not had any ear pain or ear fullness she does not have allergies. Her vestibular rehabilitation is due to began in October. She has not had an MRA of the brain. She returns for reevaluation   HISTORY 11/29/15 Gail Barr is a 50 y.o. female here as a referral from Dr. Benna Dunks for vertigo. Past medical history morbid obesity, diabetes, hypertension, hypothyroidism. She was sitting at the desk at work in July and tongue went num and tingling and left arm. She has had an episode of left-sided numbness and tingling. She has episodes of left hand tingling at night or with use that is separate, she feels that sometimes. In July she felt very dizzy, then she started getting double vision, for 15-20 minutes, it worsened, she went to urgent care, blood pressure was 258/101. Blood sugar was 399. HTN resolved, dizziness never did and she is routinely dizzy daily since then. Objects look blurry, "shadowy", especially in bright lights. She has not driven since July due to vision changes and dizziness. She was driving with her grandson at the end of July and she had to pull over because she got dizzy. No room spinning just light headed. She is getting nausea recently as well. Sunday was a bad day because of the dizziness, no room spinning, no los sof balance, no hearing problems, she does not have headaches maybe occasisonally but they are not related to her current symptoms, still blurry vision, she checked her BP and  her BP was fine. Dizziness persistent, sitting, standing, laying, walking, her brain does not feel right. Closing her eyes and sitting still helps. Very uncomfortable. No ear pain or ear fullness. She rarely has headaches, she used to have migraines younger. No other associates symptoms or triggering factors or inciting events. No changes in medications or recent illnesses before onset of symptoms.Normal brain MRI. No acute intracranial process identified.    REVIEW OF SYSTEMS: Full 14 system review of systems performed and notable only for those listed, all others are neg:  Constitutional: neg  Cardiovascular: neg Ear/Nose/Throat: neg  Skin: neg Eyes: Blurred vision Respiratory: neg Gastroitestinal: Nausea and vomiting Hematology/Lymphatic: neg  Endocrine: neg Musculoskeletal:neg Allergy/Immunology: neg Neurological: Dizziness, weakness Psychiatric: Anxiety Sleep : neg   ALLERGIES: Allergies  Allergen Reactions  . Advil [Ibuprofen] Shortness Of Breath and Swelling    And chest pain.  Tolerates naproxen without problems  . Betadine [Povidone Iodine]     Sob, swelling  . Iohexol      Desc: Pt states [place] 5 [place] and experienced severe sob and swelling. We did her scan w/o iv contrast today.        Thanks., Onset Date: [id number]     HOME MEDICATIONS: Outpatient Medications Prior to Visit  Medication Sig Dispense Refill  . INVOKAMET 50-1000 MG TABS Take 1 tablet by mouth 2 (two) times daily.  2  . meclizine (ANTIVERT) 25 MG tablet Take 1 tablet (25 mg total) by mouth 2 (two) times daily as  needed for dizziness. Take twice a day for one week then as needed. 60 tablet 0  . SYNTHROID 175 MCG tablet Take 1 tablet (175 mcg total) by mouth daily before breakfast. 30 tablet 2  . Canagliflozin-Metformin HCl ER (INVOKAMET XR) 50-500 MG TB24 Take 1 tablet by mouth daily.    Marland Kitchen HYDROcodone-homatropine (HYCODAN) 5-1.5 MG/5ML syrup     . methylPREDNISolone  (MEDROL DOSEPAK) 4 MG TBPK tablet follow package directions (Patient not taking: Reported on 12/11/2015) 21 tablet 1   No facility-administered medications prior to visit.     PAST MEDICAL HISTORY: Past Medical History:  Diagnosis Date  . Abnormal Pap smear early 2000's   had colposcopy, no additional treatment. normal paps since  . Diabetes mellitus without complication ([place])   . Hypothyroidism 1993  . Multiple thyroid nodules   . 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 DILITATION & CURRETTAGE/HYSTROSCOPY WITH NOVASURE ABLATION N/A 03/02/2013   Procedure: DILATATION & CURETTAGE/HYSTEROSCOPY WITH NOVASURE ABLATION;  Surgeon: Olga Millers, MD;  Location: [facility];  Service: Gynecology;  Laterality: N/A;    FAMILY HISTORY: Family History  Problem Relation Age of Onset  . Asthma Brother   . Diabetes Maternal Aunt   . Diabetes Maternal Uncle   . Diabetes Maternal Grandmother   . Cancer Maternal Grandfather     prostate cancer  . Asthma Brother     SOCIAL HISTORY: Social History   Social History  . Marital status: Married    Spouse name: Dub Mikes  . Number of children: 3  . 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 Smoking status: Never Smoker  . Smokeless tobacco: Never Used  . Alcohol use Yes     Comment: one drink every three months.  . Drug use: No  . Sexual activity: Yes    Partners: Male    Birth control/ protection: None   Other Topics Concern  . Not on file   Social History Narrative   Lives with husband   Son is a police office in [place], has 1 grandchild.     Daughter is a Presenter, broadcasting in [place].     She moved here in 2005 from [place]   Caffeine use: none     PHYSICAL EXAM  Vitals:   12/11/15 1434  BP: 118/82  Pulse: 76  Weight: 265  lb 6.4 oz (120.4 kg)  Height: 5\' 8"  (1.727 m)   Body mass index is 40.35 kg/m.  Generalized: Well developed, Obese female in no acute distress  Head: normocephalic and atraumatic,. Oropharynx benign  Neck: Supple, no carotid bruits  Cardiac: Regular rate rhythm, no murmur  Musculoskeletal: No deformity   Neurological examination   Mentation: Alert oriented to time, place, history taking. Attention span and concentration appropriate. Recent and remote memory intact.  Follows all commands speech and language fluent.   Cranial nerve II-XII: Fundoscopic exam deferred Pupils were equal round reactive to light extraocular movements were full, visual field were full on confrontational test. Facial sensation and strength were normal. hearing was intact to finger rubbing bilaterally. Uvula tongue midline. head turning and shoulder shrug were normal and symmetric.Tongue protrusion into cheek strength was normal. Motor: normal bulk and tone, full strength in the BUE, BLE, fine finger movements normal, no pronator drift.  No focal weakness Sensory: normal and symmetric to light touch, pinprick, and  Vibration, in the upper and lower extremities Coordination: finger-nose-finger, heel-to-shin bilaterally, no dysmetria Reflexes: Brachioradialis 2/2, biceps 2/2, triceps 2/2, patellar 2/2, Achilles 2/2, plantar responses were flexor bilaterally. Gait and Station: Rising up from seated position without assistance, normal stance,  moderate stride, good arm swing, smooth turning, able to perform tiptoe, and heel walking without difficulty. Tandem gait is unsteady  DIAGNOSTIC DATA (LABS, IMAGING, TESTING) - I reviewed patient records, labs, notes, testing and imaging myself where available.  Lab Results  Component Value Date   WBC 7.0 11/02/2015   HGB 15.3 (H) 11/02/2015   HCT 45.0 11/02/2015   MCV 81.7 11/02/2015   PLT 375 11/02/2015      Component Value Date/Time   NA 141 11/02/2015 1632   K 4.1  11/02/2015 1632   CL 105 11/02/2015 1632   CO2 25 11/02/2015 1619   GLUCOSE 165 (H) 11/02/2015 1632   BUN 11 11/02/2015 1632   CREATININE 1.30 (H) 11/02/2015 1632   CREATININE 0.93 01/25/2015 0001   CALCIUM 9.6 11/02/2015 1619   PROT 8.1 11/02/2015 1619   ALBUMIN 4.1 11/02/2015 1619   AST 18 11/02/2015 1619   ALT 16 11/02/2015 1619   ALKPHOS 64 11/02/2015 1619   BILITOT 0.7 11/02/2015 1619   GFRNONAA 45 (L) 11/02/2015 1619   GFRAA 52 (L) 11/02/2015 1619   Lab Results  Component Value Date   CHOL 165 04/23/2015   HDL 32 (L) 04/23/2015   LDLCALC 100 04/23/2015   TRIG 166 (H) 04/23/2015   CHOLHDL 5.2 (H) 04/23/2015   Lab Results  Component Value Date   HGBA1C 8.0 (H) 04/23/2015   No results found for: [id number] Lab Results  Component Value Date   TSH 18.604 (H) 04/23/2015      ASSESSMENT AND PLAN  50 y.o. year old female  has a past medical history of Persistent dizziness now with nausea and vomiting and perceived vision changes. MRI of the brain has been unremarkable neuro exam is nonfocal    Discussed with Dr. Jaynee Eagles zofran for nausea Will schedule MRA  Vestibular rehabilitation to start on October 5 Taken out of work until Tuesday of next week Follow up with Dr. Jaynee Eagles in 1 month Dennie Bible, [facility], [facility], [facility] [address] [place], [place] [address] [phone]  Personally have participated in and made any corrections needed to history, physical, neuro exam,assessment and plan as stated above.  I have personally discussed with NP, evaluated lab date, reviewed imaging studies and agree with radiology interpretations.    Sarina Ill, MD [facility]

## 2015-12-11 NOTE — Patient Instructions (Signed)
zofran for nausea Will schedule MRA  Follow up with Dr. Jaynee Eagles in 1 month

## 2015-12-14 ENCOUNTER — Ambulatory Visit: Payer: 59 | Attending: Neurology | Admitting: Physical Therapy

## 2015-12-14 DIAGNOSIS — R42 Dizziness and giddiness: Secondary | ICD-10-CM | POA: Insufficient documentation

## 2015-12-14 DIAGNOSIS — R2689 Other abnormalities of gait and mobility: Secondary | ICD-10-CM | POA: Insufficient documentation

## 2015-12-16 ENCOUNTER — Encounter: Payer: Self-pay | Admitting: Physical Therapy

## 2015-12-16 NOTE — Therapy (Signed)
[facility] [address] [place], [place], [address] Phone: [phone]   Fax:  [phone]  Physical Therapy Evaluation  Patient Details  Name: Gail Barr MRN: [id number] Date of Birth: [birth year] Referring Provider: Dr. Sarina Ill  Encounter Date: 12/14/2015      PT End of Session - 12/16/15 2107    Visit Number 1   Number of Visits 9   Date for PT Re-Evaluation 01/13/16   Authorization Type UHC      Past Medical History:  Diagnosis Date  . Abnormal Pap smear early 2000's   had colposcopy, no additional treatment. normal paps since  . Diabetes mellitus without complication ([place])   . Hypothyroidism 1993  . Multiple thyroid nodules   . 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 DILITATION & CURRETTAGE/HYSTROSCOPY WITH NOVASURE ABLATION N/A 03/02/2013   Procedure: DILATATION & CURETTAGE/HYSTEROSCOPY WITH NOVASURE ABLATION;  Surgeon: Olga Millers, MD;  Location: [facility];  Service: Gynecology;  Laterality: N/A;    There were no vitals filed for this visit.       Subjective Assessment - 12/16/15 2029    Subjective Pt reported symptoms of blurred vision, imbalance and vertigo started at work (computer) in July 2017; pt reports being unable to work -has been out of work since onset:  last Friday was the worst it has been - had nausea and vomiting   Pertinent History DM - non-insulin dependent: pt reports episode of L side numbness and tingling 4 years ago - lasted 8 weeks: had migraines 30 yrs ago; vertebral basilar insufficiency   Patient Stated Goals resolve the vertigo; be able to read   Currently in Pain? No/denies            [facility] PT Assessment - 12/16/15 0001      Assessment   Medical Diagnosis Vertigo   Referring Provider Dr. Sarina Ill   Onset Date/Surgical Date --   July 2017   Prior Therapy None     Precautions   Precautions Other (comment)  Vertigo     Restrictions   Weight Bearing Restrictions No     Balance Screen   Has the patient fallen in the past 6 months No   Has the patient had a decrease in activity level because of a fear of falling?  Yes   Is the patient reluctant to leave their home because of a fear of falling?  --  reluctant to leave due to fear of vertigo occurring     Prior Function   Level of Independence Independent            Vestibular Assessment - 12/16/15 0001      Vestibular Assessment   General Observation pt is a 50 year old female with onset of vertigo in July 2017 while working on computer     Symptom Behavior   Type of Dizziness "Funny feeling in head"   Frequency of Dizziness constant   Aggravating Factors Activity in general   Relieving Factors Closing eyes;Lying supine;Dark room     Occulomotor Exam   Occulomotor Alignment Normal   Smooth Pursuits Intact  provoked vertigo   Saccades Intact  provoked vertigo     Dynamic visual acuity 3 line difference  PT Long Term Goals - 12/16/15 2057      PT LONG TERM GOAL #1   Title Pt will report at least 50% improvement in vertigo.   Time 4   Period Weeks   Status New     PT LONG TERM GOAL #2   Title Improve DVA to </= 2 line difference for improved gaze stabilization.   Baseline 3 line difference   Time 4   Period Weeks   Status New     PT LONG TERM GOAL #3   Title Perform SOT and establish LTG as appropriate.   Time 4   Period Weeks   Status New     PT LONG TERM GOAL #4   Title Independent in HEP for balance/vestibular exercises.   Time 4   Period Weeks   Status New     PT LONG TERM GOAL #5   Title Improve DHI score from 48 to </= 20/100 for improved mobility and to demo decr. vertigo.   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  Pt's vertigo is of unknown etiology but appears to be more visual/oculomotor/gaze stabiliization deficits.  No position changes were noted to provoke any room spinining vertigo as Dix-Hallpike tests negative. Clinical presentation is evolving ; PMH includes DM , hypothyroidism and vertebral artery insufficiency.    Rehab Potential Good   PT Frequency 2x / week   PT Duration 4 weeks   PT Treatment/Interventions ADLs/Self Care Home Management;Gait training;Stair training;Therapeutic activities;Therapeutic exercise;Balance training;Neuromuscular re-education;Patient/family education;Vestibular   PT Next Visit Plan do SOT; check x1 viewing exercise   PT Home Exercise Plan gaze stabilization   Consulted and Agree with Plan of Care Patient      Patient will benefit from skilled therapeutic intervention in order to improve the following deficits and impairments:  Dizziness, Decreased balance, Decreased activity tolerance, Difficulty walking  Visit Diagnosis: Dizziness and giddiness - Plan: PT plan of care cert/re-cert  Other abnormalities of gait and mobility - Plan: PT plan of care cert/re-cert     Problem List Patient Active Problem List   Diagnosis Date Noted  . Nausea with vomiting 12/11/2015  . 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 Dizziness 12/02/2015  . Mixed hyperlipidemia 02/12/2015  . Type 2 diabetes mellitus, uncontrolled, with renal complications ([place]) 123456  . Microalbuminuria 04/04/2014  . Chest pain 01/25/2014  . 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 Bifascicular block 01/25/2014  . New onset type 2 diabetes mellitus ([place]) 12/23/2013  . Costochondritis 01/10/2013  . Abnormal menses 01/10/2013  . Vitamin D deficiency 07/08/2011  . Hypothyroidism 07/07/2011  . Obesity, Class III, BMI 40-49.9 (morbid obesity) ([place]) 07/07/2011     Yuri Flener, [place], PT 12/16/2015, 9:08 PM  [facility] [address] [place], [place], [address] Phone: [phone]   Fax:  [phone]  Name: Gail Barr MRN: [id number] Date of Birth: 06-18-1965

## 2015-12-16 NOTE — Patient Instructions (Signed)
Gaze Stabilization: Tip Card 1.Target must remain in focus, not blurry, and appear stationary while head is in motion. 2.Perform exercises with small head movements (45 to either side of midline). 3.Increase speed of head motion so long as target is in focus. 4.If you wear eyeglasses, be sure you can see target through lens (therapist will give specific instructions for bifocal / progressive lenses). 5.These exercises may provoke dizziness or nausea. Work through these symptoms. If too dizzy, slow head movement slightly. Rest between each exercise. 6.Exercises demand concentration; avoid distractions. 7.For safety, perform standing exercises close to a counter, wall, corner, or next to someone.  Copyright  VHI. All rights reserved.  Gaze Stabilization: Standing Feet Apart   Feet shoulder width apart, keeping eyes on target on wall __6__ feet away, tilt head down 15-30 and move head side to side for __60__ seconds. Repeat while moving head up and down for __60__ seconds. Do _3-5___ sessions per day. Repeat using target on pattern background.  Copyright  VHI. All rights reserved.   

## 2015-12-17 ENCOUNTER — Ambulatory Visit: Payer: 59 | Admitting: Physical Therapy

## 2015-12-17 ENCOUNTER — Other Ambulatory Visit: Payer: Self-pay | Admitting: *Deleted

## 2015-12-17 DIAGNOSIS — R42 Dizziness and giddiness: Secondary | ICD-10-CM

## 2015-12-17 DIAGNOSIS — R2689 Other abnormalities of gait and mobility: Secondary | ICD-10-CM

## 2015-12-17 NOTE — Patient Instructions (Signed)
Balance: Eyes Closed - Bilateral (Varied Surfaces)    Stand, feet shoulder width, close eyes. Maintain balance _30___ seconds. Repeat ___2_ times per set. Do __1-2__ sets per session. Do __5__ sessions per week. Repeat on compliant surface: foam.   Also stand with eyes open - add head turns side to side and up/down Copyright  VHI. All rights reserved.

## 2015-12-19 NOTE — Therapy (Signed)
[facility] [address] [place], [place], [address] Phone: [phone]   Fax:  [phone]  Physical Therapy Treatment  Patient Details  Name: Gail Barr MRN: [id number] Date of Birth: December 05, 1965 Referring Provider: Dr. Sarina Ill  Encounter Date: 12/17/2015      PT End of Session - 12/19/15 1013    Visit Number 2   Number of Visits 9   Date for PT Re-Evaluation 01/13/16   Authorization Type UHC   PT Start Time 0940   PT Stop Time 1020   PT Time Calculation (min) 40 min      Past Medical History:  Diagnosis Date  . Abnormal Pap smear early 2000's   had colposcopy, no additional treatment. normal paps since  . Diabetes mellitus without complication ([place])   . Hypothyroidism 1993  . Multiple thyroid nodules   . 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 DILITATION & CURRETTAGE/HYSTROSCOPY WITH NOVASURE ABLATION N/A 03/02/2013   Procedure: DILATATION & CURETTAGE/HYSTEROSCOPY WITH NOVASURE ABLATION;  Surgeon: Olga Millers, MD;  Location: [facility];  Service: Gynecology;  Laterality: N/A;    There were no vitals filed for this visit.      Subjective Assessment - 12/19/15 1010    Subjective Pt reports she did the exercises over the weekend - made her dizzy but "I know it will help"   Pertinent History DM - non-insulin dependent: pt reports episode of L side numbness and tingling 4 years ago - lasted 8 weeks: had migraines 30 yrs ago; vertebral basilar insufficiency   Patient Stated Goals resolve the vertigo; be able to read   Currently in Pain? No/denies      Neuro Re-ed; reviewed gaze stabilization exercise - Pt performed in standing - horizontal and vertical head turns for approx. 30 secs each direction Pt performed sit to sidelying to R and L sides with more dizziness reported  with R sidelying than on L side; no nystagmus observed Pt continues to report visual disturbance of feeling that therapist is right in front of her face when actually approx. 2' away from her  SOT attempted - pt completed conditions 1, 2 and 2 trials on condition 3;  All trials were below normal; - pt became upset and anxious with feeling dizzy And as if she was going to fall - test was stopped after 2nd trial of condition 3  Somatosensory score 85 with N=90/100;  Visual and vestibular not scored due to incomplete test  Pt performed standing on floor with EO - with head turns horizontal and vertical with targets for improved gaze stabilization Pt performed standing on floor with EC with slow head turns side to side and vertical for incr. Vestibular input in maintaining balance -- gave these exercises For HEP                           PT Education - 12/19/15 1013    Education provided Yes   Education Details added standing on floor with EC and standing with EO with head turns   Person(s) Educated Patient   Methods Explanation;Demonstration;Handout   Comprehension Verbalized understanding;Returned demonstration             PT Long Term Goals - 12/16/15 2057      PT LONG TERM GOAL #1   Title Pt will  report at least 50% improvement in vertigo.   Time 4   Period Weeks   Status New     PT LONG TERM GOAL #2   Title Improve DVA to </= 2 line difference for improved gaze stabilization.   Baseline 3 line difference   Time 4   Period Weeks   Status New     PT LONG TERM GOAL #3   Title Perform SOT and establish LTG as appropriate.   Time 4   Period Weeks   Status New     PT LONG TERM GOAL #4   Title Independent in HEP for balance/vestibular exercises.   Time 4   Period Weeks   Status New     PT LONG TERM GOAL #5   Title Improve DHI score from 48 to </= 20/100 for improved mobility and to demo decr. vertigo.   Baseline 48/100   Time 4   Period Weeks    Status New               Plan - 12/19/15 1014    Clinical Impression Statement Pt unable to tolerate full SOT due to feelings of vertigo and feeling as if she was going to fall - completed through 2 trials of condition 3:  no normal scores achieved on conditions 1, 2, or 3; pt has signficant decr. vestibular sensory input in maintaining balance; pt also cont to c/o dizziness with R sidelying but no nystagmus observed in room light   Rehab Potential Good   PT Frequency 2x / week   PT Duration 4 weeks   PT Treatment/Interventions ADLs/Self Care Home Management;Gait training;Stair training;Therapeutic activities;Therapeutic exercise;Balance training;Neuromuscular re-education;Patient/family education;Vestibular   PT Next Visit Plan check balance on floor exercises - cont to incorporate gaze as tolerated with vestibular/balance exercises   PT Home Exercise Plan gaze stabilization and habituation for sit to sidelying;  added standing on floor with EC , with EO with head turns   Consulted and Agree with Plan of Care Patient      Patient will benefit from skilled therapeutic intervention in order to improve the following deficits and impairments:  Dizziness, Decreased balance, Decreased activity tolerance, Difficulty walking  Visit Diagnosis: Other abnormalities of gait and mobility  Dizziness and giddiness     Problem List Patient Active Problem List   Diagnosis Date Noted  . Nausea with vomiting 12/11/2015  . 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 Dizziness 12/02/2015  . Mixed hyperlipidemia 02/12/2015  . Type 2 diabetes mellitus, uncontrolled, with renal complications ([place]) 123456  . Microalbuminuria 04/04/2014  . Chest pain 01/25/2014  . 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 Bifascicular block 01/25/2014  . New onset type 2 diabetes mellitus ([place]) 12/23/2013  . Costochondritis 01/10/2013  . Abnormal menses 01/10/2013  . Vitamin D deficiency 07/08/2011  .  Hypothyroidism 07/07/2011  . Obesity, Class III, BMI 40-49.9 (morbid obesity) ([place]) 07/07/2011    Galan Ghee, [place], PT 12/19/2015, 10:19 AM  [facility] [address] [place], [place], [address] Phone: [phone]   Fax:  [phone]  Name: Gail Barr MRN: [id number] Date of Birth: [birth year]

## 2015-12-25 ENCOUNTER — Telehealth: Payer: Self-pay | Admitting: Nurse Practitioner

## 2015-12-25 NOTE — Telephone Encounter (Signed)
Pt records at front desk for pick up.

## 2015-12-25 NOTE — Telephone Encounter (Signed)
Patient called to request clinical notes for disability, from September 19th office visit w/NP, Hoyle Sauer.

## 2015-12-27 ENCOUNTER — Ambulatory Visit: Payer: 59 | Admitting: Rehabilitative and Restorative Service Providers"

## 2016-01-01 ENCOUNTER — Ambulatory Visit: Payer: 59 | Attending: Neurology | Admitting: Physical Therapy

## 2016-01-01 ENCOUNTER — Telehealth: Payer: Self-pay | Admitting: Neurology

## 2016-01-01 DIAGNOSIS — R2689 Other abnormalities of gait and mobility: Secondary | ICD-10-CM | POA: Diagnosis present

## 2016-01-01 DIAGNOSIS — R42 Dizziness and giddiness: Secondary | ICD-10-CM | POA: Insufficient documentation

## 2016-01-01 NOTE — Therapy (Signed)
[facility] [address] [place], [place], [address] Phone: [phone]   Fax:  [phone]  Physical Therapy Treatment  Patient Details  Name: Gail Barr MRN: [id number] Date of Birth: 08/11/1965 Referring Provider: Dr. Sarina Ill  Encounter Date: 01/01/2016      PT End of Session - 01/01/16 1250    Visit Number 3   Number of Visits 9   Date for PT Re-Evaluation 01/13/16   Authorization Type UHC   Authorization - Visit Number 3   Authorization - Number of Visits 30   PT Start Time [id number]   PT Stop Time 0935   PT Time Calculation (min) 40 min      Past Medical History:  Diagnosis Date  . Abnormal Pap smear early 2000's   had colposcopy, no additional treatment. normal paps since  . Diabetes mellitus without complication ([place])   . Hypothyroidism 1993  . Multiple thyroid nodules   . 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 DILITATION & CURRETTAGE/HYSTROSCOPY WITH NOVASURE ABLATION N/A 03/02/2013   Procedure: DILATATION & CURETTAGE/HYSTEROSCOPY WITH NOVASURE ABLATION;  Surgeon: Olga Millers, MD;  Location: [facility];  Service: Gynecology;  Laterality: N/A;    There were no vitals filed for this visit.      Subjective Assessment - 01/01/16 1245    Subjective Pt reports dizziness is much better: was able to work on computer for 15" 1st time, 2nd time was able to work on it for 1 hour   Pertinent History DM - non-insulin dependent: pt reports episode of L side numbness and tingling 4 years ago - lasted 8 weeks: had migraines 30 yrs ago; vertebral basilar insufficiency   Patient Stated Goals resolve the vertigo; be able to read      Sensory Organization Test: composite score 17/100 with N= 68/100  Condition 1 - all 3 trials below N Condition 2 - all 3 trials below N Condition 3 -  FALL on all 3 trials Condition 4 - all 3 trials below N Condition 5 - FALL on all 3 trials Condition 6 - FALL on all 3 trials  Somatosensory score 68/100 with N =90/100 Visual input score 51/100 with N = 74/100 Vestibular input 1-2/100 with N = 53/100     Pt performed VOR in standing with horizontal head turns 15 secs, with vertical head turns 15 secs with target on patterned background    Dynamic Visual Acuity test; 2 line difference - static visual acuity line 10, dynamic visual acuity line 8 (WNL's)  Self Care: discussed results of SOT with patient; pt has significant vestibular hypofunction per SOT Progressed x1 viewing exercise to patterned background; progressed standing on floor with EC to standing on pillow with EC                    PT Education - 01/01/16 1248    Education provided Yes   Education Details progressed x1 viewing to patterned background; informed pt to stand on pillow with EC when standing on floor with EC is no longer challenging   Person(s) Educated Patient   Methods Explanation;Demonstration   Comprehension Verbalized understanding;Returned demonstration             PT Long Term Goals - 12/16/15 2057      PT LONG TERM GOAL #1   Title Pt will report  at least 50% improvement in vertigo.   Time 4   Period Weeks   Status New     PT LONG TERM GOAL #2   Title Improve DVA to </= 2 line difference for improved gaze stabilization.   Baseline 3 line difference   Time 4   Period Weeks   Status New     PT LONG TERM GOAL #3   Title Perform SOT and establish LTG as appropriate.   Time 4   Period Weeks   Status New     PT LONG TERM GOAL #4   Title Independent in HEP for balance/vestibular exercises.   Time 4   Period Weeks   Status New     PT LONG TERM GOAL #5   Title Improve DHI score from 48 to </= 20/100 for improved mobility and to demo decr. vertigo.   Baseline 48/100   Time 4   Period Weeks   Status New                Plan - 01/01/16 1251    Clinical Impression Statement Pt able to tolerate complete SOT today with 1 seated rest period (after condition 3):  pt reported feeling nauseaus after test - required seated rest period approx. 2" duration   Rehab Potential Good   PT Frequency 2x / week   PT Duration 4 weeks   PT Treatment/Interventions ADLs/Self Care Home Management;Gait training;Stair training;Therapeutic activities;Therapeutic exercise;Balance training;Neuromuscular re-education;Patient/family education;Vestibular   PT Next Visit Plan check balance on floor exercises - cont to incorporate gaze as tolerated with vestibular/balance exercises   PT Home Exercise Plan gaze stabilization and habituation for sit to sidelying;  added standing on floor with EC , with EO with head turns; progress VOR to pattern, and to pillow   Consulted and Agree with Plan of Care Patient      Patient will benefit from skilled therapeutic intervention in order to improve the following deficits and impairments:  Dizziness, Decreased balance, Decreased activity tolerance, Difficulty walking  Visit Diagnosis: Other abnormalities of gait and mobility  Dizziness and giddiness     Problem List Patient Active Problem List   Diagnosis Date Noted  . Nausea with vomiting 12/11/2015  . 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 Dizziness 12/02/2015  . Mixed hyperlipidemia 02/12/2015  . Type 2 diabetes mellitus, uncontrolled, with renal complications ([place]) 123456  . Microalbuminuria 04/04/2014  . Chest pain 01/25/2014  . 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 Bifascicular block 01/25/2014  . New onset type 2 diabetes mellitus ([place]) 12/23/2013  . Costochondritis 01/10/2013  . Abnormal menses 01/10/2013  . Vitamin D deficiency 07/08/2011  . Hypothyroidism 07/07/2011  . Obesity, Class III, BMI 40-49.9 (morbid obesity) ([place]) 07/07/2011    Sharlynn Seckinger, [place], PT 01/01/2016, 1:00 PM  [facility] [address] [place], [place], [address] Phone: [phone]   Fax:  [phone]  Name: Gail Barr MRN: [id number] Date of Birth: [birth year]

## 2016-01-01 NOTE — Telephone Encounter (Signed)
Patient was wondering when she was going to schedule her MRA.. I informed her that it would by the end of the week.. The best number to contact the patient is [phone]

## 2016-01-02 NOTE — Telephone Encounter (Signed)
Called patient and scheduled apt.

## 2016-01-03 ENCOUNTER — Ambulatory Visit: Payer: 59 | Admitting: Physical Therapy

## 2016-01-03 DIAGNOSIS — R42 Dizziness and giddiness: Secondary | ICD-10-CM

## 2016-01-03 DIAGNOSIS — R2689 Other abnormalities of gait and mobility: Secondary | ICD-10-CM | POA: Diagnosis not present

## 2016-01-03 NOTE — Therapy (Signed)
[facility] [address] [place], [place], [address] Phone: [phone]   Fax:  [phone]  Physical Therapy Treatment  Patient Details  Name: Gail Barr MRN: [id number] Date of Birth: 09/18/1965 Referring Provider: Dr. Sarina Ill  Encounter Date: 01/03/2016      PT End of Session - 01/03/16 1622    Visit Number 4   Number of Visits 9   Date for PT Re-Evaluation 01/13/16   Authorization Type U[employer]   Authorization - Visit Number 4   Authorization - Number of Visits 30   PT Start Time 0803   PT Stop Time 0846   PT Time Calculation (min) 43 min      Past Medical History:  Diagnosis Date  . Abnormal Pap smear early 2000's   had colposcopy, no additional treatment. normal paps since  . Diabetes mellitus without complication ([place])   . Hypothyroidism 1993  . Multiple thyroid nodules   . 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 DILITATION & CURRETTAGE/HYSTROSCOPY WITH NOVASURE ABLATION N/A 03/02/2013   Procedure: DILATATION & CURETTAGE/HYSTEROSCOPY WITH NOVASURE ABLATION;  Surgeon: Olga Millers, MD;  Location: [facility];  Service: Gynecology;  Laterality: N/A;    There were no vitals filed for this visit.      Subjective Assessment - 01/03/16 1616    Subjective Pt reports dizziness is about a 6/10; woke up very early this am and was dizzy, did exercises (x1 viewing) and this helped to calm dizziness down   Pertinent History DM - non-insulin dependent: pt reports episode of L side numbness and tingling 4 years ago - lasted 8 weeks: had migraines 30 yrs ago; vertebral basilar insufficiency   Patient Stated Goals resolve the vertigo; be able to read   Currently in Pain? No/denies              Neuro Re-ed:  Standing on blue Airex - head turns side to side and up/down:  marching with head turns 5 reps each direction inside // bars  Sit to stand - pivot turn to R and L sides - 5 reps each direction;  Added ball toss after pivot turn for 2nd set 5 reps to each side  Standing on Airex - making letter T shape with ball - visually tracking ball with head movement also - 5 reps  Pt amb. 3 laps around track - cues to increase speed - no head movement - pt focusing straight ahead  Bouncing on blue physioball - with head turns horizontal and vertical as tolerated - c/o nausea with bouncing so frequent Rest breaks needed; bouncing with EC with CGA            Balance Exercises - 01/03/16 1617      Balance Exercises: Standing   Standing Eyes Opened Narrow base of support (BOS);Wide (BOA);Head turns;Foam/compliant surface;5 reps  with tragets horizontal and vertical   Standing Eyes Closed Foam/compliant surface;Solid surface;Wide (BOA);Head turns;5 reps  on decline   Rockerboard Anterior/posterior;EO;EC;Other reps (comment)  20 reps    Gait with Head Turns Forward;Other reps (comment)  for 78' distance                PT Long Term Goals - 12/16/15 2057      PT LONG TERM GOAL #1   Title Pt will report at least 50% improvement in vertigo.  Time 4   Period Weeks   Status New     PT LONG TERM GOAL #2   Title Improve DVA to </= 2 line difference for improved gaze stabilization.   Baseline 3 line difference   Time 4   Period Weeks   Status New     PT LONG TERM GOAL #3   Title Perform SOT and establish LTG as appropriate.   Time 4   Period Weeks   Status New     PT LONG TERM GOAL #4   Title Independent in HEP for balance/vestibular exercises.   Time 4   Period Weeks   Status New     PT LONG TERM GOAL #5   Title Improve DHI score from 48 to </= 20/100 for improved mobility and to demo decr. vertigo.   Baseline 48/100   Time 4   Period Weeks   Status New               Plan - 01/03/16 1623    Clinical Impression  Statement Pt c/o severe nausea (no vomiting) with vestibular activities, especially those requiring head turns horizontal and vertical; horizontal turns appear to provoke more symptoms than vertical head turns;  pt tolerated activities well with frequent short rest breaks   Rehab Potential Good   PT Frequency 2x / week   PT Duration 4 weeks   PT Treatment/Interventions ADLs/Self Care Home Management;Gait training;Stair training;Therapeutic activities;Therapeutic exercise;Balance training;Neuromuscular re-education;Patient/family education;Vestibular   PT Next Visit Plan check balance on floor exercises - cont to incorporate gaze as tolerated with vestibular/balance exercises   PT Home Exercise Plan gaze stabilization and habituation for sit to sidelying;  added standing on floor with EC , with EO with head turns; progress VOR to pattern, and to pillow   Consulted and Agree with Plan of Care Patient      Patient will benefit from skilled therapeutic intervention in order to improve the following deficits and impairments:  Dizziness, Decreased balance, Decreased activity tolerance, Difficulty walking  Visit Diagnosis: Other abnormalities of gait and mobility  Dizziness and giddiness     Problem List Patient Active Problem List   Diagnosis Date Noted  . Nausea with vomiting 12/11/2015  . 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 Dizziness 12/02/2015  . Mixed hyperlipidemia 02/12/2015  . Type 2 diabetes mellitus, uncontrolled, with renal complications ([facility]) 123456  . Microalbuminuria 04/04/2014  . Chest pain 01/25/2014  . 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 Bifascicular block 01/25/2014  . New onset type 2 diabetes mellitus ([facility]) 12/23/2013  . Costochondritis 01/10/2013  . Abnormal menses 01/10/2013  . Vitamin D deficiency 07/08/2011  . Hypothyroidism 07/07/2011  . Obesity, Class III, BMI 40-49.9 (morbid obesity) ([place]) 07/07/2011    Shireen Rayburn, [place],  PT 01/03/2016, 4:28 PM  [facility] [address] [place], [place], [address] Phone: [phone]   Fax:  [phone]  Name: Jalyne Sweaney MRN: [id number] Date of Birth: [birth year]

## 2016-01-04 ENCOUNTER — Telehealth: Payer: Self-pay | Admitting: Neurology

## 2016-01-04 NOTE — Telephone Encounter (Signed)
Called the number had to leave a message

## 2016-01-04 NOTE — Telephone Encounter (Signed)
Dr Ahern- please advise 

## 2016-01-04 NOTE — Telephone Encounter (Signed)
Gail Barr you say patient last and I think you were in favor of her short-term disability. Would you please take care of this and call this doctor? thanks

## 2016-01-04 NOTE — Telephone Encounter (Signed)
Kevin/Dr. Francene Finders (on behalf of [employer] for patient's insurance company) [phone] called to request peer review for patient's short term disability.

## 2016-01-08 ENCOUNTER — Ambulatory Visit: Payer: 59 | Admitting: Physical Therapy

## 2016-01-08 DIAGNOSIS — R42 Dizziness and giddiness: Secondary | ICD-10-CM

## 2016-01-08 DIAGNOSIS — R2689 Other abnormalities of gait and mobility: Secondary | ICD-10-CM | POA: Diagnosis not present

## 2016-01-09 ENCOUNTER — Ambulatory Visit: Payer: 59 | Admitting: Neurology

## 2016-01-09 ENCOUNTER — Ambulatory Visit (INDEPENDENT_AMBULATORY_CARE_PROVIDER_SITE_OTHER): Payer: 59

## 2016-01-09 DIAGNOSIS — R42 Dizziness and giddiness: Secondary | ICD-10-CM

## 2016-01-09 NOTE — Therapy (Signed)
[facility] [address] [place], [place], [address] Phone: [phone]   Fax:  [phone]  Physical Therapy Treatment  Patient Details  Name: Gail Barr MRN: [id number] Date of Birth: [birth year] Referring Provider: Dr. Sarina Ill  Encounter Date: 01/08/2016      PT End of Session - 01/09/16 0955    Visit Number 6   Number of Visits 9   Date for PT Re-Evaluation 01/13/16   Authorization Type UHC   Authorization - Visit Number 6   Authorization - Number of Visits 30   PT Start Time [id number]   PT Stop Time 0931   PT Time Calculation (min) 41 min      Past Medical History:  Diagnosis Date  . Abnormal Pap smear early 2000's   had colposcopy, no additional treatment. normal paps since  . Diabetes mellitus without complication ([place])   . Hypothyroidism 1993  . Multiple thyroid nodules   . 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 DILITATION & CURRETTAGE/HYSTROSCOPY WITH NOVASURE ABLATION N/A 03/02/2013   Procedure: DILATATION & CURETTAGE/HYSTEROSCOPY WITH NOVASURE ABLATION;  Surgeon: Olga Millers, MD;  Location: [facility];  Service: Gynecology;  Laterality: N/A;    There were no vitals filed for this visit.      Subjective Assessment - 01/08/16 1349    Subjective Pt reports dizziness is 6/10 intensity; reports  brightly colored background bothered her with gaze stabilization exercise so she changed it to a pastel colored background   Pertinent History DM - non-insulin dependent: pt reports episode of L side numbness and tingling 4 years ago - lasted 8 weeks: had migraines 30 yrs ago; vertebral basilar insufficiency   Patient Stated Goals resolve the vertigo; be able to read   Currently in Pain? No/denies         NeuroRe-ed:  Standing on blue Airex - head turns side to side and  up/down: marching with head turns 5 reps each direction inside // bars  Sit to stand - pivot turn to R and L sides - 5 reps each direction;  Added ball toss after pivot turn for 2nd set 5 reps to each side  sit stand - pivot turn R, 180 degree turn for habituation with turning; then sit to stand, pivot turn to left side with 180 degree Turn - 3 reps with rest breaks as needed due to nausea with this activity        Marching on incline on blue mat for increased vestibular input - with head turns side to side and up/down Marching on decline, and marching turned sideways both directions on incline - with EO and EC, added Horizontal and vertical head turns 10 reps each           Balance Exercises - 01/09/16 0953      Balance Exercises: Standing   Standing Eyes Opened Narrow base of support (BOS);Foam/compliant surface;Head turns;5 reps  horizontal and vertical   Standing Eyes Closed Wide (BOA);Foam/compliant surface;Head turns;5 reps;Other (comment)  horizontal and vertical   Rockerboard Anterior/posterior   Turning Right;Left;5 reps  added ball toss to increase gaze stabilization   Other Standing Exercises standing on Bosu - with head turns horizontal and vertical 5 reps each:  partial squats x 10 reps with minimal UE support  PT Long Term Goals - 12/16/15 2057      PT LONG TERM GOAL #1   Title Pt will report at least 50% improvement in vertigo.   Time 4   Period Weeks   Status New     PT LONG TERM GOAL #2   Title Improve DVA to </= 2 line difference for improved gaze stabilization.   Baseline 3 line difference   Time 4   Period Weeks   Status New     PT LONG TERM GOAL #3   Title Perform SOT and establish LTG as appropriate.   Time 4   Period Weeks   Status New     PT LONG TERM GOAL #4   Title Independent in HEP for balance/vestibular exercises.   Time 4   Period Weeks   Status New     PT LONG TERM GOAL #5   Title Improve DHI score  from 48 to </= 20/100 for improved mobility and to demo decr. vertigo.   Baseline 48/100   Time 4   Period Weeks   Status New               Plan - 01/08/16 1353    Clinical Impression Statement Pt progressing well towards goals; requires short seated rest breaks with nausea provoked by vestibular activities but able to suppress with seated rest breaks; pt's symptoms continue to be worse with activities requiring gaze stabilization and increased visual input   Rehab Potential Good   PT Frequency 2x / week   PT Duration 4 weeks   PT Treatment/Interventions ADLs/Self Care Home Management;Gait training;Stair training;Therapeutic activities;Therapeutic exercise;Balance training;Neuromuscular re-education;Patient/family education;Vestibular   PT Next Visit Plan check balance on floor exercises - cont to incorporate gaze as tolerated with vestibular/balance exercises   PT Home Exercise Plan gaze stabilization and habituation for sit to sidelying;  added standing on floor with EC , with EO with head turns; progress VOR to pattern, and to pillow   Consulted and Agree with Plan of Care Patient      Patient will benefit from skilled therapeutic intervention in order to improve the following deficits and impairments:  Dizziness, Decreased balance, Decreased activity tolerance, Difficulty walking  Visit Diagnosis: Dizziness and giddiness  Other abnormalities of gait and mobility     Problem List Patient Active Problem List   Diagnosis Date Noted  . Nausea with vomiting 12/11/2015  . 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 Dizziness 12/02/2015  . Mixed hyperlipidemia 02/12/2015  . Type 2 diabetes mellitus, uncontrolled, with renal complications ([facility]) 123456  . Microalbuminuria 04/04/2014  . Chest pain 01/25/2014  . 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 Bifascicular block 01/25/2014  . New onset type 2 diabetes mellitus ([place]) 12/23/2013  . Costochondritis 01/10/2013  .  Abnormal menses 01/10/2013  . Vitamin D deficiency 07/08/2011  . Hypothyroidism 07/07/2011  . Obesity, Class III, BMI 40-49.9 (morbid obesity) ([place]) 07/07/2011    Fara Worthy, [place], PT 01/09/2016, 9:57 AM  [facility] [address] [place], [place], [address] Phone: [phone]   Fax:  [phone]  Name: Anahit Hauser MRN: [id number] Date of Birth: 05-01-1965

## 2016-01-10 ENCOUNTER — Ambulatory Visit: Payer: 59 | Admitting: Physical Therapy

## 2016-01-10 DIAGNOSIS — R2689 Other abnormalities of gait and mobility: Secondary | ICD-10-CM

## 2016-01-10 DIAGNOSIS — R42 Dizziness and giddiness: Secondary | ICD-10-CM

## 2016-01-11 NOTE — Therapy (Signed)
[facility] [address] [place], [place], [address] Phone: [phone]   Fax:  [phone]  Physical Therapy Treatment  Patient Details  Name: Gail Barr MRN: [id number] Date of Birth: 09/10/1965 Referring Provider: Dr. Sarina Ill  Encounter Date: 01/10/2016      PT End of Session - 01/11/16 1743    Visit Number 7   Number of Visits 9   Date for PT Re-Evaluation 01/13/16   Authorization Type UHC   Authorization - Visit Number 7   Authorization - Number of Visits 30   PT Start Time 0801   PT Stop Time 0846   PT Time Calculation (min) 45 min      Past Medical History:  Diagnosis Date  . Abnormal Pap smear early 2000's   had colposcopy, no additional treatment. normal paps since  . Diabetes mellitus without complication ([facility])   . Hypothyroidism 1993  . Multiple thyroid nodules   . 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 DILITATION & CURRETTAGE/HYSTROSCOPY WITH NOVASURE ABLATION N/A 03/02/2013   Procedure: DILATATION & CURETTAGE/HYSTEROSCOPY WITH NOVASURE ABLATION;  Surgeon: Olga Millers, MD;  Location: [facility];  Service: Gynecology;  Laterality: N/A;    There were no vitals filed for this visit.      Subjective Assessment - 01/11/16 1742    Subjective Pt reports dizziness is 6/10; went shopping after PT on Tuesday and was not ambulating steadily - says her friend noticed the sway/unsteadiness at times while she was walking; pt does report that she is doing better overall  Pt reports she had MRA on Wed., 10-18 but does not know results yet   Pertinent History DM - non-insulin dependent: pt reports episode of L side numbness and tingling 4 years ago - lasted 8 weeks: had migraines 30 yrs ago; vertebral basilar insufficiency   Patient Stated Goals resolve the vertigo; be able to  read   Currently in Pain? No/denies      NeuroRe-ed:  Marketing executive Test score 34/100 for composite score;  Somatosensory input WNL's at 89/100 with N= 86/100 Visual input score per SOT 47/100 with N =74/100 Vestibular input score 24/100 with N= 55/100   Pt had FALL on condition 5, trial 2 and on condition 6, trial 1;  Composite score 34/100 much improved from composite score 17/100 On test on 01-01-16; vestibular input has increased from minimal (1-2/100) on 01-01-16 test to 24/100 on 01-10-16 test  Dynamic visual acuity test - 1 line difference from static visual acuity test (WNL's)  Dizziness Handicap Index score 40/100 on 01-10-16; score 48/100 on 12-14-15 -- Some improvement but not enough to achieve established LTG   Reviewed HEP including x1 viewing in standing;  Increased visual input and visual stimulation appear to provoke symptoms including vertigo and nausea                          PT Long Term Goals - 01/11/16 1744      PT LONG TERM GOAL #1   Title Pt will report at least 50% improvement in vertigo.   Baseline met 01-10-16 - pt reports vertigo is about 50% improved since intensity at initial eval on 12-14-15   Status Achieved     PT LONG TERM GOAL #2   Title Improve DVA to </= 2 line difference for  improved gaze stabilization.   Baseline 1 line difference (WNL's) on 01-10-16   Status Achieved     PT LONG TERM GOAL #3   Title Perform SOT and establish LTG as appropriate.   Baseline SOT completed on 01-01-16   Status Achieved     PT LONG TERM GOAL #4   Title Independent in HEP for balance/vestibular exercises.   Status Achieved     PT LONG TERM GOAL #5   Title Improve DHI score from 48 to </= 20/100 for improved mobility and to demo decr. vertigo.   Baseline score on [place] is now 40/100 -- improved from 48/100 on 12-14-15   Status Not Met             Patient will benefit from skilled therapeutic intervention in order to  improve the following deficits and impairments:     Visit Diagnosis: Dizziness and giddiness  Other abnormalities of gait and mobility     Problem List Patient Active Problem List   Diagnosis Date Noted  . Nausea with vomiting 12/11/2015  . 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 Dizziness 12/02/2015  . Mixed hyperlipidemia 02/12/2015  . Type 2 diabetes mellitus, uncontrolled, with renal complications ([facility]) 80/05/4915  . Microalbuminuria 04/04/2014  . Chest pain 01/25/2014  . 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 Bifascicular block 01/25/2014  . New onset type 2 diabetes mellitus ([facility]) 12/23/2013  . Costochondritis 01/10/2013  . Abnormal menses 01/10/2013  . Vitamin D deficiency 07/08/2011  . Hypothyroidism 07/07/2011  . Obesity, Class III, BMI 40-49.9 (morbid obesity) ([facility]) 07/07/2011    Terrall Bley, [place], PT 01/11/2016, 5:50 PM  [facility] [address] [place], [place], [address] Phone: [phone]   Fax:  [phone]  Name: Gail Barr MRN: [id number] Date of Birth: [birth year]

## 2016-01-15 ENCOUNTER — Ambulatory Visit (INDEPENDENT_AMBULATORY_CARE_PROVIDER_SITE_OTHER): Payer: 59 | Admitting: Neurology

## 2016-01-15 ENCOUNTER — Encounter: Payer: Self-pay | Admitting: Neurology

## 2016-01-15 VITALS — BP 124/80 | HR 66 | Ht 68.0 in | Wt 267.4 lb

## 2016-01-15 DIAGNOSIS — R42 Dizziness and giddiness: Secondary | ICD-10-CM

## 2016-01-15 MED ORDER — ALPRAZOLAM 0.25 MG PO TABS: 0.2500 mg | ORAL_TABLET | Freq: Three times a day (TID) | ORAL | 1 refills | Status: DC | PRN

## 2016-01-15 NOTE — Progress Notes (Signed)
[facility]    Provider:  Dr Jaynee Eagles Referring Provider: No ref. provider found Primary Care Physician:  Imelda Pillow, NP  CC:  dizziness  Interval history: Reviewed MRI of the brain and MRA which did not show any cause for her dizziness. Nausa is better, dizziness is better. She denies any headaches. I feel she should do more vestibular therapy, still symptomatic but improving. She started working yesterday. She still has vertigo and improved with vestibular therapy I feel there is still potential for more improvement with continued vestibular therapy.   HPI:  Gail Barr is a 50 y.o. female here as a referral from Dr. Benna Dunks for vertigo. Past medical history morbid obesity, diabetes, hypertension, hypothyroidism. She was sitting at the desk at work in July and tongue went num and tingling and left arm. She has had an episode of left-sided numbness and tingling. She has episodes of left hand tingling at night or with use that is separate, she feels that sometimes. In July she felt very dizzy, then she started getting double vision, for 15-20 minutes, it worsened, she went to urgent care, blood pressure was 258/101. Blood sugar was 399. HTN resolved, dizziness never did and she is routinely dizzy daily since then. Objects look blurry, "shadowy", especially in bright lights. She has not driven since July due to vision changes and dizziness. She was driving with her grandson at the end of July and she had to pull over because she got dizzy. No room spinning just light headed. She is getting nausea recently as well. Sunday was a bad day because of the dizziness, no room spinning, no los sof balance, no hearing problems, she does not have headaches maybe occasisonally but they are not related to her current symptoms, still blurry vision, she checked her BP and her BP was fine. Dizziness persistent, sitting, standing, laying, walking, her brain does not feel right. Closing her  eyes and sitting still helps. Very uncomfortable. No ear pain or ear fullness. She rarely has headaches, she used to have migraines younger. No other associates symptoms or triggering factors or inciting events. No changes in medications or recent illnesses before onset of symptoms.  Reviewed notes, labs and imaging from outside physicians, which showed:  Hemoglobin A1c 8 04/23/2015, creatinine 1.3 11/02/2015, TSH 18.6 04/23/2015  Personally reviewed MRI of the brain images and agree with the following: Cerebral volume within normal limits for patient age. Mild scattered nonspecific cerebral white matter disease present, felt to be within normal limits for patient age.  No abnormal foci of restricted diffusion to suggest acute ischemia. Gray-white matter differentiation maintained. Normal intracranial vascular flow voids are preserved. No acute or chronic intracranial hemorrhage. No areas of chronic infarction.  No mass lesion, midline shift or mass effect. No hydrocephalus. No extra-axial fluid collection. Major dural sinuses are grossly patent.  Craniocervical junction normal. Visualized upper cervical spine unremarkable.  Pituitary gland normal. No acute abnormality about the globes and orbits.  Paranasal sinuses and mastoid air cells are clear. Inner ear structures grossly normal.  Bone marrow signal intensity within normal limits. No scalp soft tissue abnormality.  IMPRESSION: Normal brain MRI. No acute intracranial process identified.  Review of Systems: Patient complains of symptoms per HPI as well as the following symptoms: Chills, fatigue, blurred vision, double vision, swelling in legs, flushing, aching muscles, headache, numbness, weakness, dizziness, not enough sleep, decreased energy, change in appetite. Pertinent negatives per HPI. All others negative.   Social History   Social  History  . Marital status: Married    Spouse name: Dub Mikes  . Number  of children: 3  . Years of education: 69   Occupational History  . 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 Smoking status: Never Smoker  . Smokeless tobacco: Never Used  . Alcohol use Yes     Comment: one drink every three months.  . Drug use: No  . Sexual activity: Yes    Partners: Male    Birth control/ protection: None   Other Topics Concern  . Not on file   Social History Narrative   Lives with husband   Son is a police office in [place], has 1 grandchild.     Daughter is a Presenter, broadcasting in [place].     She moved here in 2005 from [place]   Caffeine use: none    Family History  Problem Relation Age of Onset  . Asthma Brother   . Diabetes Maternal Aunt   . Diabetes Maternal Uncle   . Diabetes Maternal Grandmother   . Cancer Maternal Grandfather     prostate cancer  . Asthma Brother     Past Medical History:  Diagnosis Date  . Abnormal Pap smear early 2000's   had colposcopy, no additional treatment. normal paps since  . Diabetes mellitus without complication ([place])   . Hypothyroidism 1993  . Multiple thyroid nodules   . 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 DILITATION & CURRETTAGE/HYSTROSCOPY WITH NOVASURE ABLATION N/A 03/02/2013   Procedure: DILATATION & CURETTAGE/HYSTEROSCOPY WITH NOVASURE ABLATION;  Surgeon: Olga Millers, MD;  Location: [facility];  Service: Gynecology;  Laterality: N/A;    Current Outpatient Prescriptions  Medication Sig Dispense Refill  . INVOKAMET 50-1000 MG TABS Take 1 tablet by mouth 2 (two) times daily.  2  . ondansetron (ZOFRAN) 4 MG tablet Take 1 tablet (4 mg total) by mouth every 8 (eight) hours as needed for nausea or vomiting. 30 tablet 3  . SYNTHROID 175 MCG tablet Take 1 tablet (175 mcg total) by mouth daily before breakfast. 30 tablet 2   No current facility-administered medications for  this visit.     Allergies as of 01/15/2016 - Review Complete 01/15/2016  Allergen Reaction Noted  . Advil [ibuprofen] Shortness Of Breath and Swelling 01/26/2013  . Betadine [povidone iodine]  03/02/2013  . Iohexol  11/10/2005    Vitals: BP 124/80 (BP Location: Right Arm, Patient Position: Sitting, Cuff Size: Large)   Pulse 66   Ht 5\' 8"  (1.727 m)   Wt 267 lb 6.4 oz (121.3 kg)   BMI 40.66 kg/m  Last Weight:  Wt Readings from Last 1 Encounters:  01/15/16 267 lb 6.4 oz (121.3 kg)   Last Height:   Ht Readings from Last 1 Encounters:  01/15/16 5\' 8"  (1.727 m)    Physical exam: Exam: Gen: NAD, conversant, well nourised, obese, well groomed                     CV: RRR, no MRG. No Carotid Bruits. No peripheral edema, warm, nontender Eyes: Conjunctivae clear without exudates or hemorrhage  Neuro: Detailed Neurologic Exam  Speech:    Speech is normal; fluent and spontaneous with normal comprehension.  Cognition:    The patient is oriented to person, place, and time;     recent and remote  memory intact;     language fluent;     normal attention, concentration,     fund of knowledge Cranial Nerves:    The pupils are equal, round, and reactive to light. The fundi are normal and spontaneous venous pulsations are present. Visual fields are full to finger confrontation. Extraocular movements are intact. Trigeminal sensation is intact and the muscles of mastication are normal. The face is symmetric. The palate elevates in the midline. Hearing intact. Voice is normal. Shoulder shrug is normal. The tongue has normal motion without fasciculations.   Coordination:    Normal finger to nose and heel to shin. Normal rapid alternating movements.   Gait:    Heel-toe and tandem gait are normal.   Motor Observation:    No asymmetry, no atrophy, and no involuntary movements noted. Tone:    Normal muscle tone.    Posture:    Posture is normal. normal erect    Strength:     Strength is V/V in the upper and lower limbs.      Sensation: intact to LT     Reflex Exam:  DTR's:    Deep tendon reflexes in the upper and lower extremities are normal bilaterally.   Toes:    The toes are downgoing bilaterally.   Clonus:    Clonus is absent.  Did not elicit nystagmas but she did feel dizzy on dix hallpike.       Assessment/Plan:  50 year old with persistent dizziness and perceived vision changes. MRI of the brain unremarkable. Neuro exam non focal. Pt's vertigo is of unknown etiology but appears to be more visual/oculomotor/gaze stabiliization deficits.  No position changes were noted to provoke any room spinining vertigo as Dix-Hallpike tests negative.  She still has vertigo and improved with vestibular therapy I feel there is still potential for more improvement with continued vestibular therapy.   She needs an ENT evaluation just to ensure no inner ear etiology.   MRI of the brain was normal. MRA showed incidental narrowing of the right supraclinoid internal carotid artery, recommend managing vascular risk factors such as cholesterol, diabetes, HTN and aggressive management, obesity.   Remember to drink plenty of fluid, eat healthy meals and do not skip any meals. Try to eat protein with a every meal and eat a healthy snack such as fruit or nuts in between meals. Try to keep a regular sleep-wake schedule and try to exercise daily, particularly in the form of walking, 20-30 minutes a day, if you can.   As far as diagnostic testing: Vestibular therapy continued  Our phone number is [phone]. We also have an after hours call service for urgent matters and there is a physician on-call for urgent questions. For any emergencies you know to call 911 or go to the nearest emergency room  Cc: Dr. Benna Dunks  Sarina Ill, [facility] [address] [place], [place] [address]  Phone [phone] Fax [phone]  A  total of 30 minutes was spent face-to-face with this patient. Over half this time was spent on counseling patient on the dizziness diagnosis and different diagnostic and therapeutic options available.

## 2016-01-15 NOTE — Patient Instructions (Signed)
Remember to drink plenty of fluid, eat healthy meals and do not skip any meals. Try to eat protein with a every meal and eat a healthy snack such as fruit or nuts in between meals. Try to keep a regular sleep-wake schedule and try to exercise daily, particularly in the form of walking, 20-30 minutes a day, if you can.   As far as your medications are concerned, I would like to suggest: Xanax low dose tid prn  As far as diagnostic testing: Vestibular therapy  I would like to see you back in as needed, sooner if we need to. Please call us with any interim questions, concerns, problems, updates or refill requests.   Our phone number is [phone]. We also have an after hours call service for urgent matters and there is a physician on-call for urgent questions. For any emergencies you know to call 911 or go to the nearest emergency room   Alprazolam tablets What is this medicine? ALPRAZOLAM (al PRAY zoe lam) is a benzodiazepine. It is used to treat anxiety and panic attacks. This medicine may be used for other purposes; ask your health care provider or pharmacist if you have questions. What should I tell my health care provider before I take this medicine? They need to know if you have any of these conditions: -an alcohol or drug abuse problem -bipolar disorder, depression, psychosis or other mental health conditions -glaucoma -kidney or liver disease -lung or breathing disease -myasthenia gravis -Parkinson's disease -porphyria -seizures or a history of seizures -suicidal thoughts -an unusual or allergic reaction to alprazolam, other benzodiazepines, foods, dyes, or preservatives -pregnant or trying to get pregnant -breast-feeding How should I use this medicine? Take this medicine by mouth with a glass of water. Follow the directions on the prescription label. Take your medicine at regular intervals. Do not take it more often than directed. If you have been taking this medicine regularly  for some time, do not suddenly stop taking it. You must gradually reduce the dose or you may get severe side effects. Ask your doctor or health care professional for advice. Even after you stop taking this medicine it can still affect your body for several days. Talk to your pediatrician regarding the use of this medicine in children. Special care may be needed. Overdosage: If you think you have taken too much of this medicine contact a poison control center or emergency room at once. NOTE: This medicine is only for you. Do not share this medicine with others. What if I miss a dose? If you miss a dose, take it as soon as you can. If it is almost time for your next dose, take only that dose. Do not take double or extra doses. What may interact with this medicine? Do not take this medicine with any of the following medications: -certain medicines for HIV infection or AIDS -ketoconazole -itraconazole This medicine may also interact with the following medications: -birth control pills -certain macrolide antibiotics like clarithromycin, erythromycin, troleandomycin -cimetidine -cyclosporine -ergotamine -grapefruit juice -herbal or dietary supplements like kava kava, melatonin, dehydroepiandrosterone, DHEA, St. John's Wort or valerian -imatinib, STI-571 -isoniazid -levodopa -medicines for depression, anxiety, or psychotic disturbances -prescription pain medicines -rifampin, rifapentine, or rifabutin -some medicines for blood pressure or heart problems -some medicines for seizures like carbamazepine, oxcarbazepine, phenobarbital, phenytoin, primidone This list may not describe all possible interactions. Give your health care provider a list of all the medicines, herbs, non-prescription drugs, or dietary supplements you use. Also tell them if  you smoke, drink alcohol, or use illegal drugs. Some items may interact with your medicine. What should I watch for while using this medicine? Visit your  doctor or health care professional for regular checks on your progress. Your body can become dependent on this medicine. Ask your doctor or health care professional if you still need to take it. You may get drowsy or dizzy. Do not drive, use machinery, or do anything that needs mental alertness until you know how this medicine affects you. To reduce the risk of dizzy and fainting spells, do not stand or sit up quickly, especially if you are an older patient. Alcohol may increase dizziness and drowsiness. Avoid alcoholic drinks. Do not treat yourself for coughs, colds or allergies without asking your doctor or health care professional for advice. Some ingredients can increase possible side effects. What side effects may I notice from receiving this medicine? Side effects that you should report to your doctor or health care professional as soon as possible: -allergic reactions like skin rash, itching or hives, swelling of the face, lips, or tongue -confusion, forgetfulness -depression -difficulty sleeping -difficulty speaking -feeling faint or lightheaded, falls -mood changes, excitability or aggressive behavior -muscle cramps -trouble passing urine or change in the amount of urine -unusually weak or tired Side effects that usually do not require medical attention (report to your doctor or health care professional if they continue or are bothersome): -change in sex drive or performance -changes in appetite This list may not describe all possible side effects. Call your doctor for medical advice about side effects. You may report side effects to FDA at 1-800-FDA-1088. Where should I keep my medicine? Keep out of the reach of children. This medicine can be abused. Keep your medicine in a safe place to protect it from theft. Do not share this medicine with anyone. Selling or giving away this medicine is dangerous and against the law. Store at room temperature between 20 and 25 degrees C (68 and 77  degrees F). This medicine may cause accidental overdose and death if taken by other adults, children, or pets. Mix any unused medicine with a substance like cat litter or coffee grounds. Then throw the medicine away in a sealed container like a sealed bag or a coffee can with a lid. Do not use the medicine after the expiration date. NOTE: This sheet is a summary. It may not cover all possible information. If you have questions about this medicine, talk to your doctor, pharmacist, or health care provider.    2016, Elsevier/Gold Standard. (2013-11-29 14:51:36)

## 2016-01-28 ENCOUNTER — Ambulatory Visit: Payer: 59 | Admitting: Physical Therapy

## 2016-03-24 DIAGNOSIS — N189 Chronic kidney disease, unspecified: Secondary | ICD-10-CM

## 2016-03-24 HISTORY — DX: Chronic kidney disease, unspecified: N18.9

## 2017-04-21 ENCOUNTER — Other Ambulatory Visit: Payer: Self-pay

## 2017-04-21 DIAGNOSIS — R51 Headache: Secondary | ICD-10-CM | POA: Diagnosis present

## 2017-04-21 DIAGNOSIS — E039 Hypothyroidism, unspecified: Secondary | ICD-10-CM | POA: Insufficient documentation

## 2017-04-21 DIAGNOSIS — Z79899 Other long term (current) drug therapy: Secondary | ICD-10-CM | POA: Diagnosis not present

## 2017-04-21 DIAGNOSIS — E119 Type 2 diabetes mellitus without complications: Secondary | ICD-10-CM | POA: Diagnosis not present

## 2017-04-21 NOTE — ED Triage Notes (Signed)
Pt reports migraine X2 days. States she has tried OTC meds with no relief. Denies dizziness, lightheadedness, N/V, photosensitivity.

## 2017-04-22 ENCOUNTER — Emergency Department (HOSPITAL_COMMUNITY)
Admission: EM | Admit: 2017-04-22 | Discharge: 2017-04-22 | Disposition: A | Discharge status: Home / Self Care | Payer: 59 | Attending: Emergency Medicine | Admitting: Emergency Medicine

## 2017-04-22 ENCOUNTER — Telehealth: Payer: Self-pay

## 2017-04-22 ENCOUNTER — Encounter (HOSPITAL_COMMUNITY): Payer: Self-pay | Admitting: Emergency Medicine

## 2017-04-22 DIAGNOSIS — R519 Headache, unspecified: Secondary | ICD-10-CM

## 2017-04-22 DIAGNOSIS — R51 Headache: Secondary | ICD-10-CM

## 2017-04-22 MED ORDER — DEXAMETHASONE SODIUM PHOSPHATE 10 MG/ML IJ SOLN
10.0000 mg | Freq: Once | INTRAMUSCULAR | Status: AC
  Administered 2017-04-22: 10 mg via INTRAVENOUS
  Filled 2017-04-22: qty 1

## 2017-04-22 MED ORDER — SODIUM CHLORIDE 0.9 % IV BOLUS (SEPSIS)
1000.0000 mL | Freq: Once | INTRAVENOUS | Status: AC
  Administered 2017-04-22: 1000 mL via INTRAVENOUS

## 2017-04-22 MED ORDER — METOCLOPRAMIDE HCL 5 MG/ML IJ SOLN
10.0000 mg | Freq: Once | INTRAMUSCULAR | Status: AC
  Administered 2017-04-22: 10 mg via INTRAVENOUS
  Filled 2017-04-22: qty 2

## 2017-04-22 MED ORDER — DIPHENHYDRAMINE HCL 50 MG/ML IJ SOLN
25.0000 mg | Freq: Once | INTRAMUSCULAR | Status: AC
  Administered 2017-04-22: 25 mg via INTRAVENOUS
  Filled 2017-04-22: qty 1

## 2017-04-22 NOTE — Telephone Encounter (Signed)
Called pt back to let hr know that we will need to have her sign a records release form signed . Pt advised me that she will have Renee Rival office fax [place] one over on her behalf. Thanks [employer]

## 2017-04-22 NOTE — Discharge Instructions (Signed)
We are glad you are feeling better. Try to rest and relax at home. Can follow-up with your primary care doctor if any ongoing issues. Please return here for any new/acute changes.

## 2017-04-22 NOTE — ED Provider Notes (Signed)
[facility] EMERGENCY DEPARTMENT Provider Note   CSN: [id number] Arrival date & time: 04/21/17  2350     History   Chief Complaint Chief Complaint  Patient presents with  . Migraine    HPI Gail Barr is a 52 y.o. female.  The history is provided by the patient and medical records.  Migraine  Associated symptoms include headaches.     52 year old female with history of diabetes, hypothyroidism, thyroid nodules, presenting to the ED with migraine headache.  States this is been ongoing for 3 days now.  Pain localized to left central portion of her head.  Pain is throbbing in nature.  She denies any associated dizziness, nausea, vomiting, light sensitivity, confusion, slurred speech, numbness, or weakness.  States she does have history of migraines, they do seem to be brought on by stressful events.  She has had a few "bad days" at work recently.  She did try taking some ibuprofen at home without any relief.  Past Medical History:  Diagnosis Date  . Abnormal Pap smear early 2000's   had colposcopy, no additional treatment. normal paps since  . Diabetes mellitus without complication ([place])   . Hypothyroidism 1993  . Multiple thyroid nodules   . Pyelonephritis 1993   when pregnant; subsequently found to have shrunken, nonfunctioning kidney on one side    Patient Active Problem List   Diagnosis Date Noted  . Nausea with vomiting 12/11/2015  . 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 Dizziness 12/02/2015  . Mixed hyperlipidemia 02/12/2015  . Type 2 diabetes mellitus, uncontrolled, with renal complications ([place]) 72/53/6644  . Microalbuminuria 04/04/2014  . Chest pain 01/25/2014  . 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 Bifascicular block 01/25/2014  . New onset type 2 diabetes mellitus ([place]) 12/23/2013  . Costochondritis 01/10/2013  . Abnormal menses 01/10/2013  . Vitamin D deficiency 07/08/2011  . Hypothyroidism 07/07/2011  . Obesity, Class III,  BMI 40-49.9 (morbid obesity) ([place]) 07/07/2011    Past Surgical History:  Procedure Laterality Date  . DILITATION & CURRETTAGE/HYSTROSCOPY WITH NOVASURE ABLATION N/A 03/02/2013   Procedure: DILATATION & CURETTAGE/HYSTEROSCOPY WITH NOVASURE ABLATION;  Surgeon: Olga Millers, MD;  Location: [facility];  Service: Gynecology;  Laterality: N/A;    OB History    Gravida Para Term Preterm AB Living   6 3     3 3    SAB TAB Ectopic Multiple Live Births   3               Home Medications    Prior to Admission medications   Medication Sig Start Date End Date Taking? Authorizing Provider  ALPRAZolam (XANAX) 0.25 MG tablet Take 1 tablet (0.25 mg total) by mouth 3 (three) times daily as needed for anxiety. 01/15/16   Melvenia Beam, MD  INVOKAMET 50-1000 MG TABS Take 1 tablet by mouth 2 (two) times daily. 11/06/15   [provider]  ondansetron (ZOFRAN) 4 MG tablet Take 1 tablet (4 mg total) by mouth every 8 (eight) hours as needed for nausea or vomiting. 12/11/15   Dennie Bible, NP  SYNTHROID 175 MCG tablet Take 1 tablet (175 mcg total) by mouth daily before breakfast. 02/12/15   Rita Ohara, MD    Family History Family History  Problem Relation Age of Onset  . Asthma Brother   . Diabetes Maternal Aunt   . Diabetes Maternal Uncle   . Diabetes Maternal Grandmother   . Cancer Maternal Grandfather  prostate cancer  . Asthma Brother     Social History Social History   Tobacco Use  . Smoking status: Never Smoker  . Smokeless tobacco: Never Used  Substance Use Topics  . Alcohol use: Yes    Comment: one drink every three months.  . Drug use: No     Allergies   Advil [ibuprofen]; Betadine [povidone iodine]; and Iohexol   Review of Systems Review of Systems  Neurological: Positive for headaches.  All other systems reviewed and are negative.    Physical Exam Updated Vital Signs BP 140/78 (BP Location: Right Arm)   Pulse 93   Temp 98.4 F (36.9 C)  (Oral)   Resp 18   Ht 5\' 8"  (1.727 m)   Wt 115.7 kg (255 lb)   SpO2 99%   BMI 38.77 kg/m   Physical Exam  Constitutional: She is oriented to person, place, and time. She appears well-developed and well-nourished. No distress.  Tearful on exam  HENT:  Head: Normocephalic and atraumatic.  Right Ear: External ear normal.  Left Ear: External ear normal.  Eyes: Conjunctivae and EOM are normal. Pupils are equal, round, and reactive to light.  PERRL  Neck: Normal range of motion and full passive range of motion without pain. Neck supple. No neck rigidity.  No rigidity, no meningismus  Cardiovascular: Normal rate, regular rhythm and normal heart sounds.  No murmur heard. Pulmonary/Chest: Effort normal and breath sounds normal. No respiratory distress. She has no wheezes. She has no rhonchi.  Abdominal: Soft. Bowel sounds are normal. There is no tenderness. There is no guarding.  Musculoskeletal: Normal range of motion. She exhibits no edema.  Neurological: She is alert and oriented to person, place, and time. She has normal strength. She displays no tremor. No cranial nerve deficit or sensory deficit. She displays no seizure activity.  AAOx3, answering questions and following commands appropriately; equal strength UE and LE bilaterally; CN grossly intact; moves all extremities appropriately without ataxia; no focal neuro deficits or facial asymmetry appreciated  Skin: Skin is warm and dry. No rash noted. She is not diaphoretic.  Psychiatric: She has a normal mood and affect. Her behavior is normal. Thought content normal.  Nursing note and vitals reviewed.    ED Treatments / Results  Labs (all labs ordered are listed, but only abnormal results are displayed) Labs Reviewed - No data to display  EKG  EKG Interpretation None       Radiology No results found.  Procedures Procedures (including critical care time)  Medications Ordered in ED Medications  diphenhydrAMINE  (BENADRYL) injection 25 mg (25 mg Intravenous Given 04/22/17 0042)  metoCLOPramide (REGLAN) injection 10 mg (10 mg Intravenous Given 04/22/17 0042)  dexamethasone (DECADRON) injection 10 mg (10 mg Intravenous Given 04/22/17 0042)  sodium chloride 0.9 % bolus 1,000 mL (1,000 mLs Intravenous New Bag/Given 04/22/17 0041)     Initial Impression / Assessment and Plan / ED Course  I have reviewed the triage vital signs and the nursing notes.  Pertinent labs & imaging results that were available during my care of the patient were reviewed by me and considered in my medical decision making (see chart for details).  52 year old female here with migraine headache for the past 3 days.  Reports some stressful days at work which usually precede her migraines.  She is afebrile and nontoxic but does appear uncomfortable.  Neurologic exam is nonfocal.  No signs or symptoms concerning for meningitis.  Will give migraine cocktail and reassess.  2:29 AM Migraine resolved after cocktail here.  Patient states she is feeling much better.  Remains neurologically intact.  Feel she is stable for discharge.  She will follow-up closely with her primary care doctor.  Work note given.  She understands to return here for any new/acute changes.  Final Clinical Impressions(s) / ED Diagnoses   Final diagnoses:  Bad headache    ED Discharge Orders    None       Larene Pickett, PA-C 04/22/17 0244    Ezequiel Essex, MD 04/22/17 [phone]

## 2017-04-24 ENCOUNTER — Telehealth: Payer: Self-pay | Admitting: Nurse Practitioner

## 2017-04-24 ENCOUNTER — Ambulatory Visit (INDEPENDENT_AMBULATORY_CARE_PROVIDER_SITE_OTHER): Payer: 59 | Admitting: Nurse Practitioner

## 2017-04-24 ENCOUNTER — Other Ambulatory Visit: Payer: 59

## 2017-04-24 ENCOUNTER — Encounter: Payer: Self-pay | Admitting: Nurse Practitioner

## 2017-04-24 ENCOUNTER — Ambulatory Visit: Payer: 59 | Admitting: Nurse Practitioner

## 2017-04-24 VITALS — BP 120/84 | HR 61 | Temp 97.6°F | Ht 68.0 in | Wt 270.0 lb

## 2017-04-24 DIAGNOSIS — E1129 Type 2 diabetes mellitus with other diabetic kidney complication: Secondary | ICD-10-CM | POA: Diagnosis not present

## 2017-04-24 DIAGNOSIS — G43719 Chronic migraine without aura, intractable, without status migrainosus: Secondary | ICD-10-CM | POA: Diagnosis not present

## 2017-04-24 DIAGNOSIS — E039 Hypothyroidism, unspecified: Secondary | ICD-10-CM

## 2017-04-24 DIAGNOSIS — E1165 Type 2 diabetes mellitus with hyperglycemia: Secondary | ICD-10-CM

## 2017-04-24 DIAGNOSIS — E049 Nontoxic goiter, unspecified: Secondary | ICD-10-CM

## 2017-04-24 DIAGNOSIS — IMO0002 Reserved for concepts with insufficient information to code with codable children: Secondary | ICD-10-CM

## 2017-04-24 LAB — COMPREHENSIVE METABOLIC PANEL
ALT: 16 U/L (ref 0–35)
AST: 11 U/L (ref 0–37)
Albumin: 4.3 g/dL (ref 3.5–5.2)
Alkaline Phosphatase: 62 U/L (ref 39–117)
BUN: 15 mg/dL (ref 6–23)
CHLORIDE: 97 meq/L (ref 96–112)
CO2: 28 meq/L (ref 19–32)
Calcium: 9.7 mg/dL (ref 8.4–10.5)
Creatinine, Ser: 1.3 mg/dL — ABNORMAL HIGH (ref 0.40–1.20)
GFR: 55.85 mL/min — AB (ref >60.00)
GLUCOSE: 539 mg/dL — AB (ref 70–99)
Potassium: 4.7 mEq/L (ref 3.5–5.1)
SODIUM: 136 meq/L (ref 135–145)
Total Bilirubin: 0.7 mg/dL (ref 0.2–1.2)
Total Protein: 8.6 g/dL — ABNORMAL HIGH (ref 6.0–8.3)

## 2017-04-24 LAB — CBC
HEMATOCRIT: 43 % (ref 36.0–46.0)
HEMOGLOBIN: 14.1 g/dL (ref 12.0–15.0)
MCHC: 32.8 g/dL (ref 30.0–36.0)
MCV: 84.4 fl (ref 78.0–100.0)
PLATELETS: 304 10*3/uL (ref 150.0–400.0)
RBC: 5.1 Mil/uL (ref 3.87–5.11)
RDW: 16.6 % — AB (ref 11.5–15.5)
WBC: 8.3 10*3/uL (ref 4.0–10.5)

## 2017-04-24 LAB — TSH: TSH: 37.16 u[IU]/mL — AB (ref 0.35–4.50)

## 2017-04-24 LAB — MICROALBUMIN / CREATININE URINE RATIO
Creatinine,U: 59.5 mg/dL
Microalb Creat Ratio: 69.7 mg/g — ABNORMAL HIGH (ref 0.0–30.0)
Microalb, Ur: 41.5 mg/dL — ABNORMAL HIGH (ref 0.0–1.9)

## 2017-04-24 LAB — T4, FREE: FREE T4: 0.14 ng/dL — AB (ref 0.60–1.60)

## 2017-04-24 LAB — T3, FREE: T3 FREE: 2.4 pg/mL (ref 2.3–4.2)

## 2017-04-24 MED ORDER — INSULIN ASPART 100 UNIT/ML FLEXPEN: PEN_INJECTOR | SUBCUTANEOUS | 5 refills | Status: DC

## 2017-04-24 MED ORDER — ACETAMINOPHEN 500 MG PO TABS: 500.0000 mg | ORAL_TABLET | Freq: Three times a day (TID) | ORAL | 0 refills | Status: DC | PRN

## 2017-04-24 MED ORDER — BLOOD GLUCOSE MONITOR KIT: PACK | 3 refills | Status: DC

## 2017-04-24 MED ORDER — RIZATRIPTAN BENZOATE 5 MG PO TABS: 5.0000 mg | ORAL_TABLET | Freq: Four times a day (QID) | ORAL | 0 refills | Status: DC | PRN

## 2017-04-24 MED ORDER — PROMETHAZINE HCL 25 MG PO TABS: 12.5000 mg | ORAL_TABLET | Freq: Three times a day (TID) | ORAL | 0 refills | Status: DC | PRN

## 2017-04-24 MED ORDER — PEN NEEDLES 31G X 6 MM MISC: 1.0000 "application " | Freq: Every day | 2 refills | Status: DC

## 2017-04-24 NOTE — Patient Instructions (Addendum)
Uncontrolled diabetes with HgbA1c of 11, and uncontrolled hypothyroidism. Entered referral to endocrinology. Started novolog and metformin. Also sent glucometer Rx. Consider adding lantus/levemir. She is to bring glucose readings to next office visit. F/up in 1week.   you will be given 3days off work for now.  Need to review records from previous pcp.  Recurrent Migraine Headache A migraine headache is very bad, throbbing pain that is usually on one side of your head. Recurrent migraines keep coming back (recurring). Talk with your doctor about what things may bring on (trigger) your migraine headaches. Follow these instructions at home: Medicines  Take over-the-counter and prescription medicines only as told by your doctor.  Do not drive or use heavy machinery while taking prescription pain medicine. Lifestyle  Do not use any products that contain nicotine or tobacco, such as cigarettes and e-cigarettes. If you need help quitting, ask your doctor.  Limit alcohol intake to no more than 1 drink a day for nonpregnant women and 2 drinks a day for men. One drink equals 12 oz of beer, 5 oz of wine, or 1 oz of hard liquor.  Get 7-9 hours of sleep each night.  Lessen any stress in your life. Ask your doctor about ways to lower your stress.  Stay at a healthy weight. Talk with your doctor if you need help losing weight.  Get regular exercise. General instructions  Keep a journal to find out if certain things bring on migraine headaches. For example, write down: ? What you eat and drink. ? How much sleep you get. ? Any change to your diet or medicines.  Lie down in a dark, quiet room when you have a migraine.  Try placing a cool towel over your head when you have a migraine.  Keep lights dim if bright lights bother you or make your migraines worse.  Keep all follow-up visits as told by your doctor. This is important. Contact a doctor if:  Medicine does not help your  migraines.  Your pain keeps coming back.  You have a fever.  You have weight loss without trying. Get help right away if:  Your migraine becomes really bad and medicine does not help.  You have a stiff neck.  You have trouble seeing.  Your muscles are weak or you lose control of your muscles.  You lose your balance or have trouble walking.  You feel like you will pass out (faint) or you pass out.  You have really bad symptoms that are different than your first symptoms.  You start having sudden, very bad headaches that last for one second or less, like a thunderclap. Summary  A migraine headache is very bad, throbbing pain that is usually on one side of your head.  Talk with your doctor about what things may bring on (trigger) your migraine headaches.  Take over-the-counter and prescription medicines only as told by your doctor.  Lie down in a dark, quiet room when you have a migraine.  Keep a journal about what you eat and drink, how much sleep you get, and any changes to your medicines. This can help you find out if certain things make you have migraine headaches. This information is not intended to replace advice given to you by your health care provider. Make sure you discuss any questions you have with your health care provider. Document Released: 12/18/2007 Document Revised: 02/01/2016 Document Reviewed: 02/01/2016 Elsevier Interactive Patient Education  2017 [employer].

## 2017-04-24 NOTE — Progress Notes (Signed)
Subjective:  Patient ID: Gail Barr, female    DOB: October 14, 1965  Age: 52 y.o. MRN: [id number]  CC: Establish Care (est care/ ER follow up--migrain--FMLA consult)   Migraine   This is a recurrent problem. The current episode started more than 1 month ago. The problem occurs intermittently. The problem has been waxing and waning. The pain is located in the frontal and vertex region. The pain does not radiate. The pain quality is similar to prior headaches. The quality of the pain is described as aching and dull. Associated symptoms include dizziness and photophobia. Pertinent negatives include no abnormal behavior, blurred vision, coughing, drainage, ear pain, facial sweating, fever, hearing loss, insomnia, loss of balance, muscle aches, nausea, neck pain, numbness, phonophobia, rhinorrhea, scalp tenderness, sinus pressure, sore throat, swollen glands, tingling, tinnitus, visual change, vomiting, weakness or weight loss. The symptoms are aggravated by activity, fatigue, work and emotional stress. She has tried acetaminophen and NSAIDs for the symptoms. Her past medical history is significant for migraine headaches and obesity. There is no history of cancer, cluster headaches, hypertension, immunosuppression, migraines in the family, recent head traumas or sinus disease.   Previous pcp with [facility] in [place]. Last seen 1.59yr ago.  DM type 2: Last A1c 5.4 per patient. Glucose at home 80-100. Hypoglycemia when fasting. Dm diet controlled at this time per patient.  Hypothyroidism: Current use of 2034m x 1year. Taken every other day per patient. Diagnosed over 89yrgo. Hx of thyroid nodules, had abnormal thyroid biopsy 2005.  Headache: Hx of migraines, onset at age [age]sociated wit intermittent fatigue and dizziness, blurry vision. Recent ED visit due headache. Resolved with dexamethasone, reglan, and benadryl. Frontal headache, burning, pressure in head. No  improvement with ibuprofen.  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  Outpatient Medications Prior to Visit  Medication Sig Dispense Refill  . levothyroxine (SYNTHROID) 200 MCG tablet Synthroid 200 mcg tablet    . Multiple Vitamins-Minerals (MULTIVITAL PO) Take by mouth.    . levothyroxine (SYNTHROID) 150 MCG tablet Synthroid 150 mcg tablet    . levothyroxine (SYNTHROID) 200 MCG tablet Take 200 mcg by mouth daily before breakfast.    . ALPRAZolam (XANAX) 0.25 MG tablet Take 1 tablet (0.25 mg total) by mouth 3 (three) times daily as needed for anxiety. (Patient not taking: Reported on 04/22/2017) 45 tablet 1  . ondansetron (ZOFRAN) 4 MG tablet Take 1 tablet (4 mg total) by mouth every 8 (eight) hours as needed for nausea or vomiting. (Patient not taking: Reported on 04/22/2017) 30 tablet 3  . SYNTHROID 175 MCG tablet Take 1 tablet (175 mcg total) by mouth daily before breakfast. (Patient not taking: Reported on 04/24/2017) 30 tablet 2   No facility-administered medications prior to visit.    Past Medical History:  Diagnosis Date  . Abnormal Pap smear early 2000's   had colposcopy, no additional treatment. normal paps since  . Chronic kidney disease    one kidney  . [place] . History of chickenpox   . Hypothyroidism 1993  . Multiple thyroid nodules   . Pyelonephritis 1993   when pregnant; subsequently found to have shrunken, nonfunctioning kidney on one side  . Unilateral renal atrophy    Social History   Socioeconomic History  . Marital status: Married    Spouse name: FraDub Mikes Number of children: 3  . Years of education: 16 83 Highest education level: Not on file  Social Needs  . Financial resource strain: Not on file  . Food insecurity - worry: Not on file  . Food insecurity - inability: Not on file  . Transportation needs - medical: Not on file  . Transportation needs - non-medical: Not on file  Occupational History  .  Occupation: Freight forwarder for [employer]  Tobacco Use  . Smoking status: Never Smoker  . Smokeless tobacco: Never Used  Substance and Sexual Activity  . Alcohol use: Yes    Comment: one drink every three months.  . Drug use: No  . Sexual activity: Yes    Partners: Male    Birth control/protection: None  Other Topics Concern  . Not on file  Social History Narrative   Lives with husband   Son is a police office in [place], has 1 grandchild.     Daughter is a Presenter, broadcasting in [place].     She moved here in 2005 from [place]   Caffeine use: none   Family History  Problem Relation Age of Onset  . Asthma Brother   . Diabetes Maternal Aunt   . Diabetes Maternal Uncle   . Diabetes Maternal Grandmother   . Cancer Maternal Grandfather        prostate cancer  . Asthma Brother    ROS See HPI  Objective:  BP 120/84 (BP Location: Left Arm, Patient Position: Sitting, Cuff Size: Large)   Pulse 61   Temp 97.6 F (36.4 C) (Oral)   Ht '5\' 8"'$  (1.727 m)   Wt 270 lb (122.5 kg)   LMP 02/07/2017 (Approximate) Comment: irregular, every 20month since ablation.  SpO2 98%   BMI 41.05 kg/m   BP Readings from Last 3 Encounters:  04/24/17 120/84  04/22/17 118/74  01/15/16 124/80    Wt Readings from Last 3 Encounters:  04/24/17 270 lb (122.5 kg)  04/21/17 255 lb (115.7 kg)  01/15/16 267 lb 6.4 oz (121.3 kg)    Physical Exam  Constitutional: She is oriented to person, place, and time. No distress.  HENT:  Right Ear: External ear normal.  Left Ear: External ear normal.  Nose: Nose normal.  Mouth/Throat: Oropharynx is clear and moist. No oropharyngeal exudate.  Eyes: Conjunctivae and EOM are normal. Pupils are equal, round, and reactive to light.  Neck: Normal range of motion. Neck supple. Thyromegaly present.  Cardiovascular: Normal rate, regular rhythm and normal heart sounds.  Pulmonary/Chest: Effort normal and breath sounds normal. No stridor.  Musculoskeletal: Normal range of motion. She  exhibits no edema.  Lymphadenopathy:    She has no cervical adenopathy.  Neurological: She is alert and oriented to person, place, and time. She has normal strength. No cranial nerve deficit or sensory deficit.  Skin: Skin is warm and dry.  Psychiatric: She has a normal mood and affect. Her behavior is normal.  Vitals reviewed.   Lab Results  Component Value Date   WBC 8.3 04/24/2017   HGB 14.1 04/24/2017   HCT 43.0 04/24/2017   PLT 304.0 04/24/2017   GLUCOSE 539 (HH) 04/24/2017   CHOL 165 04/23/2015   TRIG 166 (H) 04/23/2015   HDL 32 (L) 04/23/2015   LDLCALC 100 04/23/2015   ALT 16 04/24/2017   AST 11 04/24/2017   NA 136 04/24/2017   K 4.7 04/24/2017   CL 97 04/24/2017   CREATININE 1.30 (H) 04/24/2017   BUN 15 04/24/2017   CO2 28 04/24/2017   TSH 37.16 (H) 04/24/2017   INR 1.20 11/02/2015   HGBA1C 11.2 (  H) 04/24/2017   MICROALBUR 41.5 (H) 04/24/2017    No results found.  Assessment & Plan:   Nari was seen today for establish care.  Diagnoses and all orders for this visit:  Hypothyroidism, unspecified type -     TSH -     T4, free -     T3, free -     Ambulatory referral to Endocrinology  Type 2 diabetes mellitus, uncontrolled, with renal complications (HCC) -     Comprehensive metabolic panel -     Microalbumin / creatinine urine ratio -     Ambulatory referral to Endocrinology -     blood glucose meter kit and supplies KIT; Dispense based on patient and insurance preference. Use three times a day (before meals) and at bedtime. ICD:E11.29 -     insulin aspart (NOVOLOG FLEXPEN) 100 UNIT/ML FlexPen; Insulin (Novolog) sliding scale for additional Novolog: administer 59mnutes before meals.  Sugar <150 - no additional units Sugar <151-200 - 2 additional units Sugar <200-250 - 6 additional units Sugar <251-300 - 8 additional units Sugar <351-350 - 10 additional units and call office. Sugar >400 - call office. -     Insulin Pen Needle (PEN NEEDLES) 31G X 6 MM  MISC; 1 application by Does not apply route at bedtime. -     metFORMIN (GLUCOPHAGE) 850 MG tablet; Take 1 tablet (850 mg total) by mouth 2 (two) times daily with a meal.  Intractable chronic migraine without aura and without status migrainosus -     Hemoglobin A1c -     CBC -     rizatriptan (MAXALT) 5 MG tablet; Take 1 tablet (5 mg total) by mouth every 6 (six) hours as needed for migraine. May repeat in 2 hours if needed -     acetaminophen (TYLENOL) 500 MG tablet; Take 1 tablet (500 mg total) by mouth every 8 (eight) hours as needed. -     promethazine (PHENERGAN) 25 MG tablet; Take 0.5 tablets (12.5 mg total) by mouth every 8 (eight) hours as needed for nausea or vomiting.  Enlarged thyroid -     [place]YROID; Future   I have discontinued AAmayraniNyante's SYNTHROID, ondansetron, and ALPRAZolam. I am also having her start on rizatriptan, acetaminophen, promethazine, blood glucose meter kit and supplies, insulin aspart, Pen Needles, and metFORMIN. Additionally, I am having her maintain her levothyroxine, levothyroxine, levothyroxine, and Multiple Vitamins-Minerals (MULTIVITAL PO).  Meds ordered this encounter  Medications  . rizatriptan (MAXALT) 5 MG tablet    Sig: Take 1 tablet (5 mg total) by mouth every 6 (six) hours as needed for migraine. May repeat in 2 hours if needed    Dispense:  15 tablet    Refill:  0    Order Specific Question:   Supervising Provider    Answer:   ALucille Passy[[id number]]  . acetaminophen (TYLENOL) 500 MG tablet    Sig: Take 1 tablet (500 mg total) by mouth every 8 (eight) hours as needed.    Dispense:  30 tablet    Refill:  0    Order Specific Question:   Supervising Provider    Answer:   ALucille Passy[[id number]]  . promethazine (PHENERGAN) 25 MG tablet    Sig: Take 0.5 tablets (12.5 mg total) by mouth every 8 (eight) hours as needed for nausea or vomiting.    Dispense:  20 tablet    Refill:  0    Order Specific Question:   Supervising Provider  Answer:    Lucille Passy [[id number]]  . blood glucose meter kit and supplies KIT    Sig: Dispense based on patient and insurance preference. Use three times a day (before meals) and at bedtime. POE:U23.53    Dispense:  1 each    Refill:  3    Order Specific Question:   Supervising Provider    Answer:   Lucille Passy [[id number]]    Order Specific Question:   Number of strips    Answer:   100    Order Specific Question:   Number of lancets    Answer:   100  . insulin aspart (NOVOLOG FLEXPEN) 100 UNIT/ML FlexPen    Sig: Insulin (Novolog) sliding scale for additional Novolog: administer 81mnutes before meals.  Sugar <150 - no additional units Sugar <151-200 - 2 additional units Sugar <200-250 - 6 additional units Sugar <251-300 - 8 additional units Sugar <351-350 - 10 additional units and call office. Sugar >400 - call office.    Dispense:  3 mL    Refill:  5    Order Specific Question:   Supervising Provider    Answer:   ALucille Passy[[id number]]  . Insulin Pen Needle (PEN NEEDLES) 31G X 6 MM MISC    Sig: 1 application by Does not apply route at bedtime.    Dispense:  100 each    Refill:  2    Order Specific Question:   Supervising Provider    Answer:   ALucille Passy[[id number]]  . metFORMIN (GLUCOPHAGE) 850 MG tablet    Sig: Take 1 tablet (850 mg total) by mouth 2 (two) times daily with a meal.    Dispense:  60 tablet    Refill:  5    Order Specific Question:   Supervising Provider    Answer:   ALucille Passy[[id number]]    Follow-up: Return in about 1 week (around 05/01/2017) for migraines.  CWilfred Lacy NP

## 2017-04-24 NOTE — Telephone Encounter (Signed)
Patient contacted

## 2017-04-24 NOTE — Telephone Encounter (Signed)
CRITICAL VALUE STICKER  CRITICAL VALUE: glucose 539   RECEIVER (on-site recipient of call): Judson Roch, CMA  DATE & TIME NOTIFIED:  04/24/17 @ 448pm  MESSENGER (representative from lab): Hope  MD NOTIFIED: Yes  TIME OF NOTIFICATION: 450pm  RESPONSE:

## 2017-04-25 LAB — HEMOGLOBIN A1C
EAG (MMOL/L): 15.2 (calc)
Hgb A1c MFr Bld: 11.2 % of total Hgb — ABNORMAL HIGH (ref <5.7)
Mean Plasma Glucose: 275 (calc)

## 2017-04-27 ENCOUNTER — Encounter: Payer: Self-pay | Admitting: Nurse Practitioner

## 2017-04-27 MED ORDER — METFORMIN HCL 850 MG PO TABS: 850.0000 mg | ORAL_TABLET | Freq: Two times a day (BID) | ORAL | 5 refills | Status: DC

## 2017-04-29 ENCOUNTER — Encounter: Payer: Self-pay | Admitting: Nurse Practitioner

## 2017-04-29 ENCOUNTER — Ambulatory Visit (INDEPENDENT_AMBULATORY_CARE_PROVIDER_SITE_OTHER): Payer: 59 | Admitting: Nurse Practitioner

## 2017-04-29 VITALS — BP 122/86 | HR 66 | Temp 97.7°F | Ht 68.0 in | Wt 270.0 lb

## 2017-04-29 DIAGNOSIS — E1129 Type 2 diabetes mellitus with other diabetic kidney complication: Secondary | ICD-10-CM

## 2017-04-29 DIAGNOSIS — IMO0002 Reserved for concepts with insufficient information to code with codable children: Secondary | ICD-10-CM

## 2017-04-29 DIAGNOSIS — E049 Nontoxic goiter, unspecified: Secondary | ICD-10-CM | POA: Diagnosis not present

## 2017-04-29 DIAGNOSIS — R809 Proteinuria, unspecified: Secondary | ICD-10-CM

## 2017-04-29 DIAGNOSIS — E1165 Type 2 diabetes mellitus with hyperglycemia: Secondary | ICD-10-CM

## 2017-04-29 DIAGNOSIS — E039 Hypothyroidism, unspecified: Secondary | ICD-10-CM

## 2017-04-29 MED ORDER — INSULIN DETEMIR 100 UNIT/ML FLEXPEN: 12.0000 [IU] | PEN_INJECTOR | Freq: Every day | SUBCUTANEOUS | 5 refills | Status: DC

## 2017-04-29 MED ORDER — ONDANSETRON HCL 4 MG PO TABS: 4.0000 mg | ORAL_TABLET | Freq: Three times a day (TID) | ORAL | 0 refills | Status: DC | PRN

## 2017-04-29 MED ORDER — RAMIPRIL 2.5 MG PO CAPS: 2.5000 mg | ORAL_CAPSULE | Freq: Every day | ORAL | 5 refills | Status: DC

## 2017-04-29 MED ORDER — METFORMIN HCL 850 MG PO TABS: 425.0000 mg | ORAL_TABLET | Freq: Two times a day (BID) | ORAL | 5 refills | Status: DC

## 2017-04-29 NOTE — Assessment & Plan Note (Signed)
Advised to change dose administration on empty stomach. Scheduled to see endocrinology 06/12/2017. Thyroid US ordered

## 2017-04-29 NOTE — Patient Instructions (Addendum)
Decrease metformin dose to 1/2tab twice a day with food.  encourage adequate oral hydration with water mostly.  Make sure you are taking levothyroxine on an empty stomach, take 1hr apart from food and other medications.  Check glucose before meals (breakfast, lunch and dinner) and at bedtime. Bring glucose reading to next office visit.  Maintain upcoming appt with ophthalmology and endocrinology.  Hypoglycemia Hypoglycemia is when the sugar (glucose) level in the blood is too low. Symptoms of low blood sugar may include:  Feeling: ? Hungry. ? Worried or nervous (anxious). ? Sweaty and clammy. ? Confused. ? Dizzy. ? Sleepy. ? Sick to your stomach (nauseous).  Having: ? A fast heartbeat. ? A headache. ? A change in your vision. ? Jerky movements that you cannot control (seizure). ? Nightmares. ? Tingling or no feeling (numbness) around the mouth, lips, or tongue.  Having trouble with: ? Talking. ? Paying attention (concentrating). ? Moving (coordination). ? Sleeping.  Shaking.  Passing out (fainting).  Getting upset easily (irritability).  Low blood sugar can happen to people who have diabetes and people who do not have diabetes. Low blood sugar can happen quickly, and it can be an emergency. Treating Low Blood Sugar Low blood sugar is often treated by eating or drinking something sugary right away. If you can think clearly and swallow safely, follow the 15:15 rule:  Take 15 grams of a fast-acting carb (carbohydrate). Some fast-acting carbs are: ? 1 tube of glucose gel. ? 3 sugar tablets (glucose pills). ? 6-8 pieces of hard candy. ? 4 oz (120 mL) of fruit juice. ? 4 oz (120 mL) of regular (not diet) soda.  Check your blood sugar 15 minutes after you take the carb.  If your blood sugar is still at or below 70 mg/dL (3.9 mmol/L), take 15 grams of a carb again.  If your blood sugar does not go above 70 mg/dL (3.9 mmol/L) after 3 tries, get help right  away.  After your blood sugar goes back to normal, eat a meal or a snack within 1 hour.  Treating Very Low Blood Sugar If your blood sugar is at or below 54 mg/dL (3 mmol/L), you have very low blood sugar (severe hypoglycemia). This is an emergency. Do not wait to see if the symptoms will go away. Get medical help right away. Call your local emergency services (911 in the [place] Do not drive yourself to the hospital. If you have very low blood sugar and you cannot eat or drink, you may need a glucagon shot (injection). A family member or friend should learn how to check your blood sugar and how to give you a glucagon shot. Ask your doctor if you need to have a glucagon shot kit at home. Follow these instructions at home: General instructions  Avoid any diets that cause you to not eat enough food. Talk with your doctor before you start any new diet.  Take over-the-counter and prescription medicines only as told by your doctor.  Limit alcohol to no more than 1 drink per day for nonpregnant women and 2 drinks per day for men. One drink equals 12 oz of beer, 5 oz of wine, or 1 oz of hard liquor.  Keep all follow-up visits as told by your doctor. This is important. If You Have Diabetes:   Make sure you know the symptoms of low blood sugar.  Always keep a source of sugar with you, such as: ? Sugar. ? Sugar tablets. ? Glucose gel. ?  Fruit juice. ? Regular soda (not diet soda). ? Milk. ? Hard candy. ? Honey.  Take your medicines as told.  Follow your exercise and meal plan. ? Eat on time. Do not skip meals. ? Follow your sick day plan when you cannot eat or drink normally. Make this plan ahead of time with your doctor.  Check your blood sugar as often as told by your doctor. Always check before and after exercise.  Share your diabetes care plan with: ? Your work or school. ? People you live with.  Check your pee (urine) for ketones: ? When you are sick. ? As told by your  doctor.  Carry a card or wear jewelry that says you have diabetes. If You Have Low Blood Sugar From Other Causes:   Check your blood sugar as often as told by your doctor.  Follow instructions from your doctor about what you cannot eat or drink. Contact a doctor if:  You have trouble keeping your blood sugar in your target range.  You have low blood sugar often. Get help right away if:  You still have symptoms after you eat or drink something sugary.  Your blood sugar is at or below 54 mg/dL (3 mmol/L).  You have jerky movements that you cannot control.  You pass out. These symptoms may be an emergency. Do not wait to see if the symptoms will go away. Get medical help right away. Call your local emergency services (911 in the [place] Do not drive yourself to the hospital. This information is not intended to replace advice given to you by your health care provider. Make sure you discuss any questions you have with your health care provider. Document Released: 06/04/2009 Document Revised: 08/16/2015 Document Reviewed: 04/13/2015 Elsevier Interactive Patient Education  [employer].

## 2017-04-29 NOTE — Assessment & Plan Note (Addendum)
Uncontrolled with HgbA1c 11 and glucose reading >300-400. Positive urine microalbumin Symptomatic: fatigue, headache, dizziness, blurry vision. Started Novolog SSI and levemir 12units, metformin, ramipril. Decreased dose of metformin due to N/V/D. She is scheduled to see endocrinology 06/12/2017. She will make appt to see ophthalmology.

## 2017-04-29 NOTE — Progress Notes (Signed)
Subjective:  Patient ID: Gail Barr, female    DOB: [birth year]  Age: 52 y.o. MRN: [id number]  CC: Follow-up (1 wk follow up/diarrhea and vomitting yesterday-metformin started yesterday?)  Diabetes  She presents for her follow-up diabetic visit. She has type 2 diabetes mellitus. No MedicAlert identification noted. Her disease course has been worsening. Hypoglycemia symptoms include dizziness and headaches. Pertinent negatives for hypoglycemia include no confusion, hunger, mood changes, nervousness/anxiousness, pallor, seizures, speech difficulty, sweats or tremors. Associated symptoms include blurred vision, fatigue, polydipsia, visual change and weakness. Pertinent negatives for diabetes include no chest pain, no foot paresthesias, no foot ulcerations, no polyphagia, no polyuria and no weight loss. There are no hypoglycemic complications. Symptoms are stable. Diabetic complications include nephropathy. Risk factors for coronary artery disease include diabetes mellitus, dyslipidemia and sedentary lifestyle. Current diabetic treatment includes insulin injections, intensive insulin program and oral agent (monotherapy). She is compliant with treatment some of the time. Her weight is increasing steadily. She is following a diabetic diet. She has not had a previous visit with a dietitian. She never participates in exercise. There is no change in her home blood glucose trend. Her lunch blood glucose is taken between 1-2 pm. Her lunch blood glucose range is generally >200 mg/dl. Her dinner blood glucose is taken between 5-6 pm. Her dinner blood glucose range is generally >200 mg/dl. Her overall blood glucose range is >200 mg/dl. An ACE inhibitor/angiotensin II receptor blocker is not being taken. She does not see a podiatrist.Eye exam is current.   Start Metformin 3days ago: developed nausea, vomiting and diarrhea. Start insulin injection 4days ago.  Hypothyroidism: Takes medication with food. Has appt  with endo 06/12/2017.  Outpatient Medications Prior to Visit  Medication Sig Dispense Refill  . acetaminophen (TYLENOL) 500 MG tablet Take 1 tablet (500 mg total) by mouth every 8 (eight) hours as needed. 30 tablet 0  . blood glucose meter kit and supplies KIT Dispense based on patient and insurance preference. Use three times a day (before meals) and at bedtime. ICD:E11.29 1 each 3  . insulin aspart (NOVOLOG FLEXPEN) 100 UNIT/ML FlexPen Insulin (Novolog) sliding scale for additional Novolog: administer 34mnutes before meals.  Sugar <150 - no additional units Sugar <151-200 - 2 additional units Sugar <200-250 - 6 additional units Sugar <251-300 - 8 additional units Sugar <351-350 - 10 additional units and call office. Sugar >400 - call office. 3 mL 5  . Insulin Pen Needle (PEN NEEDLES) 31G X 6 MM MISC 1 application by Does not apply route at bedtime. 100 each 2  . levothyroxine (SYNTHROID) 200 MCG tablet Take 200 mcg by mouth daily before breakfast.    . Multiple Vitamins-Minerals (MULTIVITAL PO) Take by mouth.    . promethazine (PHENERGAN) 25 MG tablet Take 0.5 tablets (12.5 mg total) by mouth every 8 (eight) hours as needed for nausea or vomiting. 20 tablet 0  . rizatriptan (MAXALT) 5 MG tablet Take 1 tablet (5 mg total) by mouth every 6 (six) hours as needed for migraine. May repeat in 2 hours if needed 15 tablet 0  . metFORMIN (GLUCOPHAGE) 850 MG tablet Take 1 tablet (850 mg total) by mouth 2 (two) times daily with a meal. 60 tablet 5  . levothyroxine (SYNTHROID) 150 MCG tablet Synthroid 150 mcg tablet    . levothyroxine (SYNTHROID) 200 MCG tablet Synthroid 200 mcg tablet     No facility-administered medications prior to visit.     ROS See HPI  Objective:  BP 122/86  Pulse 66   Temp 97.7 F (36.5 C)   Ht '5\' 8"'$  (1.727 m)   Wt 270 lb (122.5 kg)   SpO2 97%   BMI 41.05 kg/m   BP Readings from Last 3 Encounters:  04/29/17 122/86  04/24/17 120/84  04/22/17 118/74    Wt  Readings from Last 3 Encounters:  04/29/17 270 lb (122.5 kg)  04/24/17 270 lb (122.5 kg)  04/21/17 255 lb (115.7 kg)    Physical Exam  Constitutional: She is oriented to person, place, and time. No distress.  Neck: Normal range of motion. Neck supple. Thyromegaly present.  Cardiovascular: Normal rate and regular rhythm.  Pulmonary/Chest: Effort normal and breath sounds normal.  Abdominal: Soft. There is no tenderness.  Musculoskeletal: She exhibits no edema.  Neurological: She is alert and oriented to person, place, and time.  Psychiatric: She has a normal mood and affect. Her behavior is normal.  Vitals reviewed.   Lab Results  Component Value Date   WBC 8.3 04/24/2017   HGB 14.1 04/24/2017   HCT 43.0 04/24/2017   PLT 304.0 04/24/2017   GLUCOSE 539 (HH) 04/24/2017   CHOL 165 04/23/2015   TRIG 166 (H) 04/23/2015   HDL 32 (L) 04/23/2015   LDLCALC 100 04/23/2015   ALT 16 04/24/2017   AST 11 04/24/2017   NA 136 04/24/2017   K 4.7 04/24/2017   CL 97 04/24/2017   CREATININE 1.30 (H) 04/24/2017   BUN 15 04/24/2017   CO2 28 04/24/2017   TSH 37.16 (H) 04/24/2017   INR 1.20 11/02/2015   HGBA1C 11.2 (H) 04/24/2017   MICROALBUR 41.5 (H) 04/24/2017    No results found.  Assessment & Plan:   Gail Barr was seen today for follow-up.  Diagnoses and all orders for this visit:  Type 2 diabetes mellitus, uncontrolled, with renal complications (HCC) -     ondansetron (ZOFRAN) 4 MG tablet; Take 1 tablet (4 mg total) by mouth every 8 (eight) hours as needed for nausea or vomiting. -     metFORMIN (GLUCOPHAGE) 850 MG tablet; Take 0.5 tablets (425 mg total) by mouth 2 (two) times daily with a meal. -     Insulin Detemir (LEVEMIR FLEXTOUCH) 100 UNIT/ML Pen; Inject 12 Units into the skin daily at 10 pm. -     ramipril (ALTACE) 2.5 MG capsule; Take 1 capsule (2.5 mg total) by mouth daily.  Microalbuminuria -     ramipril (ALTACE) 2.5 MG capsule; Take 1 capsule (2.5 mg total) by mouth  daily.  Hypothyroidism, unspecified type  Enlarged thyroid   I have changed Gail Barr's metFORMIN. I am also having her start on ondansetron, Insulin Detemir, and ramipril. Additionally, I am having her maintain her levothyroxine, Multiple Vitamins-Minerals (MULTIVITAL PO), rizatriptan, acetaminophen, promethazine, blood glucose meter kit and supplies, insulin aspart, and Pen Needles.  Meds ordered this encounter  Medications  . ondansetron (ZOFRAN) 4 MG tablet    Sig: Take 1 tablet (4 mg total) by mouth every 8 (eight) hours as needed for nausea or vomiting.    Dispense:  30 tablet    Refill:  0    Order Specific Question:   Supervising Provider    Answer:   Lucille Passy [[id number]]  . metFORMIN (GLUCOPHAGE) 850 MG tablet    Sig: Take 0.5 tablets (425 mg total) by mouth 2 (two) times daily with a meal.    Dispense:  60 tablet    Refill:  5    Order Specific Question:  Supervising Provider    Answer:   Lucille Passy [[id number]]  . Insulin Detemir (LEVEMIR FLEXTOUCH) 100 UNIT/ML Pen    Sig: Inject 12 Units into the skin daily at 10 pm.    Dispense:  2 pen    Refill:  5    Order Specific Question:   Supervising Provider    Answer:   Lucille Passy [[id number]]  . ramipril (ALTACE) 2.5 MG capsule    Sig: Take 1 capsule (2.5 mg total) by mouth daily.    Dispense:  30 capsule    Refill:  5    Order Specific Question:   Supervising Provider    Answer:   Lucille Passy [[id number]]    Follow-up: Return in about 1 week (around 05/06/2017) for DM and hypothyroidism.  Wilfred Lacy, NP

## 2017-05-06 ENCOUNTER — Encounter: Payer: Self-pay | Admitting: Nurse Practitioner

## 2017-05-06 ENCOUNTER — Ambulatory Visit (INDEPENDENT_AMBULATORY_CARE_PROVIDER_SITE_OTHER): Payer: 59 | Admitting: Nurse Practitioner

## 2017-05-06 ENCOUNTER — Ambulatory Visit: Payer: 59 | Admitting: Nurse Practitioner

## 2017-05-06 VITALS — BP 118/76 | HR 80 | Temp 98.4°F | Ht 68.0 in | Wt 279.0 lb

## 2017-05-06 DIAGNOSIS — E1129 Type 2 diabetes mellitus with other diabetic kidney complication: Secondary | ICD-10-CM

## 2017-05-06 DIAGNOSIS — E1165 Type 2 diabetes mellitus with hyperglycemia: Secondary | ICD-10-CM | POA: Diagnosis not present

## 2017-05-06 DIAGNOSIS — R42 Dizziness and giddiness: Secondary | ICD-10-CM | POA: Diagnosis not present

## 2017-05-06 DIAGNOSIS — H538 Other visual disturbances: Secondary | ICD-10-CM | POA: Diagnosis not present

## 2017-05-06 DIAGNOSIS — R1032 Left lower quadrant pain: Secondary | ICD-10-CM

## 2017-05-06 DIAGNOSIS — E039 Hypothyroidism, unspecified: Secondary | ICD-10-CM | POA: Diagnosis not present

## 2017-05-06 DIAGNOSIS — R350 Frequency of micturition: Secondary | ICD-10-CM | POA: Diagnosis not present

## 2017-05-06 DIAGNOSIS — IMO0002 Reserved for concepts with insufficient information to code with codable children: Secondary | ICD-10-CM

## 2017-05-06 LAB — POCT URINALYSIS DIPSTICK
Bilirubin, UA: NEGATIVE
Blood, UA: NEGATIVE
GLUCOSE UA: NEGATIVE
KETONES UA: NEGATIVE
LEUKOCYTES UA: NEGATIVE
NITRITE UA: NEGATIVE
Spec Grav, UA: 1.025 (ref 1.010–1.025)
Urobilinogen, UA: 0.2 E.U./dL
pH, UA: 6 (ref 5.0–8.0)

## 2017-05-06 MED ORDER — METFORMIN HCL 850 MG PO TABS: 425.0000 mg | ORAL_TABLET | Freq: Every day | ORAL | 5 refills | Status: DC

## 2017-05-06 NOTE — Patient Instructions (Addendum)
Decrease metformin to half tab with heaviest meal.  Continue novolog sliding scale and levemir.  You will be contacted to schedule Head CT.  Have ophthalmology report faxed to [place].  Thyroid US indicates chronic thyroiditis, no nodules. TSH continues to increased. Increased levothyroxine to 250. May sure it is taken on empty stomach and 2hrs apart from food and other medications. I have reached out to Dr. Dwyane Dee for other recommendations. Waiting his response. Maintain upcoming appt with him.

## 2017-05-06 NOTE — Progress Notes (Signed)
Subjective:  Patient ID: Gail Barr, female    DOB: [birth year]  Age: 52 y.o. MRN: [id number]  CC: Follow-up (1 wk follow up/blure vision,nausea and dizzy/metformin is giving her diarrhea still/forgot the blood glucose log but today 198 and noon 201)   HPI appt with Dr. Dwyane Dee 06/12/2017 appt for thyroid US 05/08/2017. appt with ophthalmology 05/10/2017.  DM: Home glucose 150-200. Denies any hypoglycemia. Has made changes to diet (decrease carbs, increase vegetables). Has persistent urinary frequency, ABD pain and diarrhea with metformin despite decreased dose. Has persistent intermittent dizziness, headache and blurry vision. She is unable to drive due to abnormal vision. Diarrhea (liquid stool, no hematochezia, no melena).  Hypothyroidism: Reports taking levothyroxine 1hr apart from food and other medications.  Outpatient Medications Prior to Visit  Medication Sig Dispense Refill  . ACCU-CHEK SOFTCLIX LANCETS lancets USE 3 TIME A DAY (BEFORE MEALS) AND AT BEDTIME  3  . acetaminophen (TYLENOL) 500 MG tablet Take 1 tablet (500 mg total) by mouth every 8 (eight) hours as needed. 30 tablet 0  . B-D UF III MINI PEN NEEDLES 31G X 5 MM MISC USE AS DIRECTED AT BEDTIME  2  . blood glucose meter kit and supplies KIT Dispense based on patient and insurance preference. Use three times a day (before meals) and at bedtime. ICD:E11.29 1 each 3  . insulin aspart (NOVOLOG FLEXPEN) 100 UNIT/ML FlexPen Insulin (Novolog) sliding scale for additional Novolog: administer 64mnutes before meals.  Sugar <150 - no additional units Sugar <151-200 - 2 additional units Sugar <200-250 - 6 additional units Sugar <251-300 - 8 additional units Sugar <351-350 - 10 additional units and call office. Sugar >400 - call office. 3 mL 5  . Insulin Detemir (LEVEMIR FLEXTOUCH) 100 UNIT/ML Pen Inject 12 Units into the skin daily at 10 pm. 2 pen 5  . Insulin Pen Needle (PEN NEEDLES) 31G X 6 MM MISC 1 application by Does  not apply route at bedtime. 100 each 2  . Multiple Vitamins-Minerals (MULTIVITAL PO) Take by mouth.    . ondansetron (ZOFRAN) 4 MG tablet Take 1 tablet (4 mg total) by mouth every 8 (eight) hours as needed for nausea or vomiting. 30 tablet 0  . promethazine (PHENERGAN) 25 MG tablet Take 0.5 tablets (12.5 mg total) by mouth every 8 (eight) hours as needed for nausea or vomiting. 20 tablet 0  . ramipril (ALTACE) 2.5 MG capsule Take 1 capsule (2.5 mg total) by mouth daily. 30 capsule 5  . rizatriptan (MAXALT) 5 MG tablet Take 1 tablet (5 mg total) by mouth every 6 (six) hours as needed for migraine. May repeat in 2 hours if needed 15 tablet 0  . levothyroxine (SYNTHROID) 200 MCG tablet Take 200 mcg by mouth daily before breakfast.    . metFORMIN (GLUCOPHAGE) 850 MG tablet Take 0.5 tablets (425 mg total) by mouth 2 (two) times daily with a meal. 60 tablet 5   No facility-administered medications prior to visit.     ROS See HPI  Objective:  BP 118/76   Pulse 80   Temp 98.4 F (36.9 C)   Ht 5' 8" (1.727 m)   Wt 279 lb (126.6 kg)   SpO2 96%   BMI 42.42 kg/m   BP Readings from Last 3 Encounters:  05/06/17 118/76  04/29/17 122/86  04/24/17 120/84    Wt Readings from Last 3 Encounters:  05/06/17 279 lb (126.6 kg)  04/29/17 270 lb (122.5 kg)  04/24/17 270 lb (122.5 kg)  Physical Exam  Constitutional: She is oriented to person, place, and time. No distress.  Eyes: Conjunctivae and EOM are normal. Pupils are equal, round, and reactive to light.  Cardiovascular: Normal rate and regular rhythm.  Pulmonary/Chest: Effort normal and breath sounds normal.  Abdominal: She exhibits no distension. There is tenderness. There is no rebound and no guarding.  LLQ tenderness  Musculoskeletal: She exhibits no edema.  Neurological: She is alert and oriented to person, place, and time. Gait normal.  Vitals reviewed.   Lab Results  Component Value Date   WBC 6.6 05/06/2017   HGB 13.9 05/06/2017    HCT 40.8 05/06/2017   PLT 282.0 05/06/2017   GLUCOSE 231 (H) 05/06/2017   CHOL 165 04/23/2015   TRIG 166 (H) 04/23/2015   HDL 32 (L) 04/23/2015   LDLCALC 100 04/23/2015   ALT 16 04/24/2017   AST 11 04/24/2017   NA 136 05/06/2017   K 4.9 05/06/2017   CL 99 05/06/2017   CREATININE 1.78 (H) 05/06/2017   BUN 20 05/06/2017   CO2 29 05/06/2017   TSH 71.38 (H) 05/06/2017   INR 1.20 11/02/2015   HGBA1C 11.2 (H) 04/24/2017   MICROALBUR 41.5 (H) 04/24/2017    Assessment & Plan:   Annely was seen today for follow-up.  Diagnoses and all orders for this visit:  Type 2 diabetes mellitus, uncontrolled, with renal complications (HCC) -     Basic metabolic panel -     metFORMIN (GLUCOPHAGE) 850 MG tablet; Take 0.5 tablets (425 mg total) by mouth daily. With food  Dizziness -     CT Head Wo Contrast; Future  Blurry vision, bilateral -     CT Head Wo Contrast; Future  Urinary frequency -     POCT urinalysis dipstick -     CBC w/Diff  Left lower quadrant pain -     POCT urinalysis dipstick -     CBC w/Diff  Hypothyroidism, unspecified type -     TSH -     SYNTHROID 200 MCG tablet; Take 1 tablet (200 mcg total) by mouth daily before breakfast. -     SYNTHROID 50 MCG tablet; Take 1 tablet (50 mcg total) by mouth daily before breakfast.   I have discontinued Kmari Gurney's levothyroxine. I have also changed her metFORMIN. Additionally, I am having her start on SYNTHROID and SYNTHROID. Lastly, I am having her maintain her Multiple Vitamins-Minerals (MULTIVITAL PO), rizatriptan, acetaminophen, promethazine, blood glucose meter kit and supplies, insulin aspart, Pen Needles, ondansetron, Insulin Detemir, ramipril, ACCU-CHEK SOFTCLIX LANCETS, and B-D UF III MINI PEN NEEDLES.  Meds ordered this encounter  Medications  . metFORMIN (GLUCOPHAGE) 850 MG tablet    Sig: Take 0.5 tablets (425 mg total) by mouth daily. With food    Dispense:  60 tablet    Refill:  5    Order Specific  Question:   Supervising Provider    Answer:   Lucille Passy [[id number]]  . SYNTHROID 200 MCG tablet    Sig: Take 1 tablet (200 mcg total) by mouth daily before breakfast.    Dispense:  30 tablet    Refill:  2    Total dose of 234mg daily    Order Specific Question:   Supervising Provider    Answer:   ALucille Passy[[id number]]  . SYNTHROID 50 MCG tablet    Sig: Take 1 tablet (50 mcg total) by mouth daily before breakfast.    Dispense:  30 tablet  Refill:  2    Total dose of 233mg daily    Order Specific Question:   Supervising Provider    Answer:   ALucille Passy[[id number]]    Follow-up: Return in about 4 weeks (around 06/03/2017) for DM.  CWilfred Lacy NP

## 2017-05-07 LAB — CBC WITH DIFFERENTIAL/PLATELET
BASOS PCT: 0.3 % (ref 0.0–3.0)
Basophils Absolute: 0 10*3/uL (ref 0.0–0.1)
Eosinophils Absolute: 0.1 10*3/uL (ref 0.0–0.7)
Eosinophils Relative: 1.8 % (ref 0.0–5.0)
HEMATOCRIT: 40.8 % (ref 36.0–46.0)
HEMOGLOBIN: 13.9 g/dL (ref 12.0–15.0)
LYMPHS PCT: 41.8 % (ref 12.0–46.0)
Lymphs Abs: 2.8 10*3/uL (ref 0.7–4.0)
MCHC: 34 g/dL (ref 30.0–36.0)
MCV: 83 fl (ref 78.0–100.0)
MONOS PCT: 6.5 % (ref 3.0–12.0)
Monocytes Absolute: 0.4 10*3/uL (ref 0.1–1.0)
Neutro Abs: 3.3 10*3/uL (ref 1.4–7.7)
Neutrophils Relative %: 49.6 % (ref 43.0–77.0)
Platelets: 282 10*3/uL (ref 150.0–400.0)
RBC: 4.91 Mil/uL (ref 3.87–5.11)
RDW: 16.6 % — AB (ref 11.5–15.5)
WBC: 6.6 10*3/uL (ref 4.0–10.5)

## 2017-05-07 LAB — BASIC METABOLIC PANEL
BUN: 20 mg/dL (ref 6–23)
CALCIUM: 10.3 mg/dL (ref 8.4–10.5)
CO2: 29 meq/L (ref 19–32)
CREATININE: 1.78 mg/dL — AB (ref 0.40–1.20)
Chloride: 99 mEq/L (ref 96–112)
GFR: 38.86 mL/min — ABNORMAL LOW (ref >60.00)
GLUCOSE: 231 mg/dL — AB (ref 70–99)
Potassium: 4.9 mEq/L (ref 3.5–5.1)
SODIUM: 136 meq/L (ref 135–145)

## 2017-05-07 LAB — TSH: TSH: 71.38 u[IU]/mL — ABNORMAL HIGH (ref 0.35–4.50)

## 2017-05-08 ENCOUNTER — Ambulatory Visit
Admission: RE | Admit: 2017-05-08 | Discharge: 2017-05-08 | Disposition: A | Discharge status: Home / Self Care | Payer: 59 | Source: Ambulatory Visit | Attending: Nurse Practitioner | Admitting: Nurse Practitioner

## 2017-05-08 ENCOUNTER — Telehealth: Payer: Self-pay | Admitting: Nurse Practitioner

## 2017-05-08 ENCOUNTER — Encounter: Payer: Self-pay | Admitting: Nurse Practitioner

## 2017-05-08 DIAGNOSIS — E049 Nontoxic goiter, unspecified: Secondary | ICD-10-CM

## 2017-05-08 MED ORDER — SYNTHROID 50 MCG PO TABS
50.0000 ug | ORAL_TABLET | Freq: Every day | ORAL | 2 refills | Status: DC
Start: 2017-05-08 — End: 2017-05-14

## 2017-05-08 MED ORDER — SYNTHROID 200 MCG PO TABS
200.0000 ug | ORAL_TABLET | Freq: Every day | ORAL | 2 refills | Status: DC
Start: 2017-05-08 — End: 2017-08-20

## 2017-05-08 NOTE — Telephone Encounter (Signed)
Waiting for the note to be completed.      Copied from [place] [phone]. Topic: Inquiry >> May 07, 2017  3:14 PM Oliver Pila B wrote: Reason for CRM: [employer] CALLED AND ASKED FOR THE OFFICE NOTES FROM 2.13.19 TO BE FAXED TO THEM FAX To: [phone] CONTACT PHONE : [phone]

## 2017-05-10 ENCOUNTER — Encounter: Payer: Self-pay | Admitting: Nurse Practitioner

## 2017-05-10 LAB — HM DIABETES EYE EXAM

## 2017-05-11 ENCOUNTER — Telehealth: Payer: Self-pay | Admitting: Nurse Practitioner

## 2017-05-11 NOTE — Telephone Encounter (Signed)
Needs OV to assess spot on her abdomen. Need to ophthalmology report. She needs to proceed with head CT as ordered (check with [facility] when this will be scheduled)

## 2017-05-11 NOTE — Telephone Encounter (Signed)
Paperwork faxed as requested

## 2017-05-11 NOTE — Telephone Encounter (Signed)
Pt has appt with [facility] on 05/14/2017 Ct is schedule 05/18/2017 Still waiting for the eye report (just check).

## 2017-05-11 NOTE — Telephone Encounter (Signed)
Spoke with the pt, she stated she just want to report that Levemir injection sit burns and leave small bump after injection but does looks infected (not sure if this need to be concern).   Pt also reported she went to see eye doctor but they didn't do much because she can not see during the exam. They will fax over the report.  Pt stated [employer] approved her to be off until 05/07/2017 due to her condition, not sure if [place] wants to take her out longer when she gets the report from eye doctor (pt schedule to go back to eye doctor in 1 mo).    Copied from [place]. Topic: General - Other >> May 11, 2017  7:35 AM Yvette Rack wrote: Reason for CRM: patient would like for [facility] assistant to give her a call about her insuline medicine stating that in the area where she gives herself the injection it looks like a cut and that she feels little dizzy and can't hardly see

## 2017-05-14 ENCOUNTER — Ambulatory Visit (INDEPENDENT_AMBULATORY_CARE_PROVIDER_SITE_OTHER): Payer: 59 | Admitting: Nurse Practitioner

## 2017-05-14 ENCOUNTER — Telehealth: Payer: Self-pay | Admitting: Nurse Practitioner

## 2017-05-14 ENCOUNTER — Encounter: Payer: Self-pay | Admitting: Nurse Practitioner

## 2017-05-14 VITALS — BP 126/80 | HR 64 | Temp 98.5°F | Ht 68.0 in | Wt 281.0 lb

## 2017-05-14 DIAGNOSIS — E1165 Type 2 diabetes mellitus with hyperglycemia: Secondary | ICD-10-CM

## 2017-05-14 DIAGNOSIS — E1129 Type 2 diabetes mellitus with other diabetic kidney complication: Secondary | ICD-10-CM | POA: Diagnosis not present

## 2017-05-14 DIAGNOSIS — H538 Other visual disturbances: Secondary | ICD-10-CM

## 2017-05-14 DIAGNOSIS — E039 Hypothyroidism, unspecified: Secondary | ICD-10-CM | POA: Diagnosis not present

## 2017-05-14 DIAGNOSIS — IMO0002 Reserved for concepts with insufficient information to code with codable children: Secondary | ICD-10-CM

## 2017-05-14 MED ORDER — SYNTHROID 50 MCG PO TABS: 100.0000 ug | ORAL_TABLET | Freq: Every day | ORAL | 2 refills | Status: DC

## 2017-05-14 NOTE — Patient Instructions (Addendum)
Contacted Dr. Ronnie Derby office for sooner appt. Due to lack of appt in next 2weeks, she was put on waiting list for not cancellation. She will be contacted by endocrinology office.  Alternate insulin injection site. Clean skin with alcohol or soap and water prior to injection.  Maintain upcoming appt for Head CT. 05/18/2017 at 11:15am on [place] at bedtime.   Take Novolog Sliding scale insulin with meals and at bedtime. Follow sliding scale chart.  You should be checking glucose 3-4times a day. Bring glucose reading to next office visit.  Take levothyroxine daily on empty stomach and 1-2hrs aprt from food and other medications.  Maintain upcoming appt with me.   Insulin Injection Instructions, Single Insulin Dose, Adult A subcutaneous injection is a shot of medicine that is injected into the layer of fat between skin and muscle. People with type 1 diabetes must take insulin because their bodies do not make it. People with type 2 diabetes may need to take insulin. There are many different types of insulin. The type of insulin that you take may determine how many injections you give yourself and when you need to take the injections. Choosing a site for injection Insulin absorption varies from site to site. It is best to inject insulin within the same body area, using a different spot in that area for each injection. Do not inject the insulin in the same spot for each injection. There are five main areas that can be used for injecting. These areas include:  Abdomen. This is the preferred area.  Front of thigh.  Upper, outer side of thigh.  Back of upper arm.  Buttocks.  Using a syringe and vial: single insulin dose First, follow the steps for Getting Ready, then continue with the steps for Pushing Air Into the [place], then follow the steps for Filling the Syringe, and finish with the steps for Injecting the Insulin. Getting Ready  1. Wash your hands with soap  and water. If soap and water are not available, use hand sanitizer. 2. Check the expiration date and type of insulin. 3. If you are using CLEAR insulin, check to see that it is clear and free of clumps. 4. Gently roll the medicine bottle (vial) between your palms to warm it. Do not shake the vial. 5. Remove the plastic pop-top covering from the vial, if one is present. 6. Use an alcohol wipe to clean the rubber top of the vial. 7. Remove the plastic cover from the needle on the syringe. Do not let the needle touch anything. Pushing Air Into the [place]  1. Pull the plunger back to bring (draw up) air into the syringe. The amount of air should be the same as the number of units in a dose of single insulin. 2. While you keep the vial right-side up, poke the needle through the rubber top of the vial. Do not turn the vial upside down to do this. 3. Push the plunger in all the way. This pushes air into the vial. 4. Do not take the needle out of the vial yet. Filling the Syringe 1. With the needle still inserted in the vial, turn the vial upside down and hold it at eye level. 2. Slowly pull back on the plunger to draw up the desired number of insulin units into the syringe. 3. If you see air bubbles in the syringe, slowly move the plunger up and down 2 or 3 times to get rid of them. 4.  Pull back the plunger until the syringe is filled to the correct dose. 5. Remove the needle from the vial. Do not let the needle touch anything. Injecting the Insulin  1. Use an alcohol wipe to clean the site where you will be injecting the needle. Let the site air-dry. 2. Hold the syringe in your writing hand like a pencil. 3. Use your other hand to pinch and hold about an inch of skin. Do not directly touch the cleaned part of the skin. 4. Gently but quickly, put the needle straight into the skin. The needle should be at a 90-degree angle (perpendicular) to the skin, as if to form the letter "L." ? For example, if  you are giving an injection in the abdomen, the abdomen forms one "leg" of the "L" and the needle forms the other "leg" of the "L." 5. For adults who have a small amount of body fat, the needle may need to be injected at a 45-degree angle instead. Your health care provider will tell you if this is necessary. ? A 45-degree angle looks like the letter "V." 6. Push the needle in as far as it will go (to the hub). 7. When the needle is completely inserted into the skin, use the thumb of your writing hand to push the plunger down all the way to inject the insulin. 8. Let go of the skin that you are pinching. Continue to hold the syringe in place with your writing hand. 9. Wait 5 seconds, then pull the needle straight out of the skin. 10. Press and hold the alcohol wipe over the injection site until any bleeding stops. Do not rub the area. 11. Do not put the plastic cover back on the needle. 12. Discard the syringe and needle directly into a sharps container, such as an empty plastic bottle with a cover. Throwing away supplies   Discard all used needles in a puncture-proof sharps disposal container. You can ask your local pharmacy about where you can get this kind of disposal container, or you can use an empty liquid laundry detergent bottle that has a cover.  Follow the disposal regulations for the area where you live. Do not use any syringe or needle more than one time.  Throw away empty vials in the regular trash. What questions should I ask my health care provider?  How often should I be taking insulin?  How often should I check my blood glucose?  What amount of insulin should I be taking at each time?  What are the side effects?  What should I do if my blood glucose is too high?  What should I do if my blood glucose is too low?  What should I do if I forget to take my insulin?  What number should I call if I have questions? Where can I get more information?  American Diabetes  Association (ADA): www.diabetes.org  American Association of Diabetes Educators (AADE) Patient Resources: https://www.diabeteseducator.org/patient-resources This information is not intended to replace advice given to you by your health care provider. Make sure you discuss any questions you have with your health care provider. Document Released: 04/13/2015 Document Revised: 08/16/2015 Document Reviewed: 04/13/2015 Elsevier Interactive Patient Education  [employer].

## 2017-05-14 NOTE — Telephone Encounter (Signed)
-----   Message from Elayne Snare, MD sent at 05/14/2017  4:12 PM EST ----- Thank you for reminding me to respond again For now I would recommend that she take 1-1/2 tablets of the 200 g until I see her.    ----- Message ----- From: Flossie Buffy, NP Sent: 05/14/2017   2:42 PM To: Elayne Snare, MD  I am sorry I did not see your response so I am resending this message. Thank you for your assistance.  Wilfred Lacy.  ----- Message ----- From: Flossie Buffy, NP Sent: 05/08/2017   8:40 AM To: Elayne Snare, MD  Good Morning Dr. Dwyane Dee,  We have a mutual patient: Mrs. Thomley. She has a new patient appointment with you on 06/12/2017. She recently established care with me 04/24/2017. During the visit we found uncontrolled diabetes, hypothyroidism and an enlarged thyroid. She reported that these were chronic problems which were previously under controlled. With recent addition of Novolog, metformin and levemir, her glucose readings have improved. Even though she reports taking levothyroxine 246mcg on empty stomach, I am concerned about her persistent elevated TSH and low T4. Please review her chart and advise on what else can be done for this patient while waiting to see you. I will greatly appreciate your collaboration.  Thank you Wilfred Lacy, NP

## 2017-05-14 NOTE — Progress Notes (Signed)
Subjective:  Patient ID: Gail Barr, female    DOB: 1965/05/11  Age: 52 y.o. MRN: [id number]  CC: Follow-up (small bump after insulin injection,looks like a cut and burns for a while/)   HPI  DM:  Denies any hypoglycemia. Checks glucose twice a day (AM: 180s-240s, PM: 200-250s) Reports she has been taking on 10units of levemir at HS. Uses thigh for insulin injections. Reports ABD is too sensitive. Reports nodule and tenderness after insulin injection. Denies any redness or swelling or drainage.  Hypothyroidism: Has not been taking levothyroxine daily. Started taking medication daily on empty stomach 1week ago.  Outpatient Medications Prior to Visit  Medication Sig Dispense Refill  . ACCU-CHEK SOFTCLIX LANCETS lancets USE 3 TIME A DAY (BEFORE MEALS) AND AT BEDTIME  3  . acetaminophen (TYLENOL) 500 MG tablet Take 1 tablet (500 mg total) by mouth every 8 (eight) hours as needed. 30 tablet 0  . B-D UF III MINI PEN NEEDLES 31G X 5 MM MISC USE AS DIRECTED AT BEDTIME  2  . blood glucose meter kit and supplies KIT Dispense based on patient and insurance preference. Use three times a day (before meals) and at bedtime. ICD:E11.29 1 each 3  . insulin aspart (NOVOLOG FLEXPEN) 100 UNIT/ML FlexPen Insulin (Novolog) sliding scale for additional Novolog: administer 25mnutes before meals.  Sugar <150 - no additional units Sugar <151-200 - 2 additional units Sugar <200-250 - 6 additional units Sugar <251-300 - 8 additional units Sugar <351-350 - 10 additional units and call office. Sugar >400 - call office. 3 mL 5  . Insulin Detemir (LEVEMIR FLEXTOUCH) 100 UNIT/ML Pen Inject 12 Units into the skin daily at 10 pm. 2 pen 5  . Insulin Pen Needle (PEN NEEDLES) 31G X 6 MM MISC 1 application by Does not apply route at bedtime. 100 each 2  . metFORMIN (GLUCOPHAGE) 850 MG tablet Take 0.5 tablets (425 mg total) by mouth daily. With food 60 tablet 5  . Multiple Vitamins-Minerals (MULTIVITAL PO) Take by  mouth.    . ondansetron (ZOFRAN) 4 MG tablet Take 1 tablet (4 mg total) by mouth every 8 (eight) hours as needed for nausea or vomiting. 30 tablet 0  . promethazine (PHENERGAN) 25 MG tablet Take 0.5 tablets (12.5 mg total) by mouth every 8 (eight) hours as needed for nausea or vomiting. 20 tablet 0  . ramipril (ALTACE) 2.5 MG capsule Take 1 capsule (2.5 mg total) by mouth daily. 30 capsule 5  . rizatriptan (MAXALT) 5 MG tablet Take 1 tablet (5 mg total) by mouth every 6 (six) hours as needed for migraine. May repeat in 2 hours if needed 15 tablet 0  . SYNTHROID 200 MCG tablet Take 1 tablet (200 mcg total) by mouth daily before breakfast. 30 tablet 2  . SYNTHROID 50 MCG tablet Take 1 tablet (50 mcg total) by mouth daily before breakfast. 30 tablet 2   No facility-administered medications prior to visit.     ROS See HPI  Objective:  BP 126/80   Pulse 64   Temp 98.5 F (36.9 C)   Ht '5\' 8"'$  (1.727 m)   Wt 281 lb (127.5 kg)   SpO2 97%   BMI 42.73 kg/m   BP Readings from Last 3 Encounters:  05/14/17 126/80  05/06/17 118/76  04/29/17 122/86    Wt Readings from Last 3 Encounters:  05/14/17 281 lb (127.5 kg)  05/06/17 279 lb (126.6 kg)  04/29/17 270 lb (122.5 kg)    Physical Exam  Constitutional: She is oriented to person, place, and time. No distress.  Cardiovascular: Normal rate.  Pulmonary/Chest: Effort normal.  Musculoskeletal: She exhibits no edema or tenderness.  Neurological: She is alert and oriented to person, place, and time.  Skin: Skin is warm and dry. No ecchymosis, no lesion and no rash noted. Rash is not macular and not nodular. No erythema.  Vitals reviewed.   Lab Results  Component Value Date   WBC 6.6 05/06/2017   HGB 13.9 05/06/2017   HCT 40.8 05/06/2017   PLT 282.0 05/06/2017   GLUCOSE 231 (H) 05/06/2017   CHOL 165 04/23/2015   TRIG 166 (H) 04/23/2015   HDL 32 (L) 04/23/2015   LDLCALC 100 04/23/2015   ALT 16 04/24/2017   AST 11 04/24/2017   NA 136  05/06/2017   K 4.9 05/06/2017   CL 99 05/06/2017   CREATININE 1.78 (H) 05/06/2017   BUN 20 05/06/2017   CO2 29 05/06/2017   TSH 71.38 (H) 05/06/2017   INR 1.20 11/02/2015   HGBA1C 11.2 (H) 04/24/2017   MICROALBUR 41.5 (H) 04/24/2017    US Thyroid  Result Date: 05/08/2017 CLINICAL DATA:  Goiter.  Enlarged thyroid gland. EXAM: THYROID ULTRASOUND TECHNIQUE: Ultrasound examination of the thyroid gland and adjacent soft tissues was performed. COMPARISON:  10/08/2005 FINDINGS: Parenchymal Echotexture: Markedly heterogenous Isthmus: 1.7 cm Right lobe: 6.9 x 3.8 x 4.0 cm Left lobe: 6.5 x 3.8 x 4.0 cm _________________________________________________________ Estimated total number of nodules >/= 1 cm: 0 Number of spongiform nodules >/=  2 cm not described below (TR1): 0 Number of mixed cystic and solid nodules >/= 1.5 cm not described below (TR2): 0 _________________________________________________________ No discrete nodules are seen within the thyroid gland. The thyroid parenchyma again is noted to be very heterogeneous suggestive of chronic thyroiditis. Overall thyroid volume appears larger compared to the 2007 study. IMPRESSION: Increase in overall volume of the thyroid gland since 2007. The thyroid gland continues to show very heterogeneous parenchyma without focal nodules. Findings are suggestive of chronic thyroiditis. The above is in keeping with the ACR TI-RADS recommendations - J Am Coll Radiol 2017;14:587-595. Electronically Signed   By: Aletta Edouard M.D.   On: 05/08/2017 16:52    Assessment & Plan:   Gail Barr was seen today for follow-up.  Diagnoses and all orders for this visit:  Type 2 diabetes mellitus, uncontrolled, with renal complications ([place])  Blurry vision, bilateral  Hypothyroidism, unspecified type   I am having Gail Barr maintain her Multiple Vitamins-Minerals (MULTIVITAL PO), rizatriptan, acetaminophen, promethazine, blood glucose meter kit and supplies, insulin  aspart, Pen Needles, ondansetron, Insulin Detemir, ramipril, ACCU-CHEK SOFTCLIX LANCETS, B-D UF III MINI PEN NEEDLES, metFORMIN, SYNTHROID, and SYNTHROID.  No orders of the defined types were placed in this encounter.   Follow-up: No Follow-up on file.  Wilfred Lacy, NP

## 2017-05-15 NOTE — Telephone Encounter (Signed)
Left vm for the pt to call back, need to inform of massage below and inquire about what kind of glucose meter she has.

## 2017-05-18 ENCOUNTER — Encounter: Payer: Self-pay | Admitting: Radiology

## 2017-05-18 ENCOUNTER — Ambulatory Visit (INDEPENDENT_AMBULATORY_CARE_PROVIDER_SITE_OTHER)
Admission: RE | Admit: 2017-05-18 | Discharge: 2017-05-18 | Disposition: A | Discharge status: Home / Self Care | Payer: 59 | Source: Ambulatory Visit | Attending: Nurse Practitioner | Admitting: Nurse Practitioner

## 2017-05-18 DIAGNOSIS — H538 Other visual disturbances: Secondary | ICD-10-CM | POA: Diagnosis not present

## 2017-05-18 DIAGNOSIS — R42 Dizziness and giddiness: Secondary | ICD-10-CM

## 2017-05-18 MED ORDER — LANCET DEVICE MISC: 12 refills | Status: DC

## 2017-05-18 MED ORDER — GLUCOSE BLOOD VI STRP: ORAL_STRIP | 12 refills | Status: DC

## 2017-05-18 NOTE — Addendum Note (Signed)
Addended byShawnie Pons on: 05/18/2017 04:39 PM   Modules accepted: Orders

## 2017-05-18 NOTE — Addendum Note (Signed)
Addended byShawnie Pons on: 05/18/2017 04:40 PM   Modules accepted: Orders

## 2017-05-18 NOTE — Telephone Encounter (Signed)
rx sent to pharmacy, pt is aware.

## 2017-05-27 ENCOUNTER — Ambulatory Visit: Payer: 59 | Admitting: Nurse Practitioner

## 2017-06-05 ENCOUNTER — Ambulatory Visit: Payer: 59 | Admitting: Nurse Practitioner

## 2017-06-11 NOTE — Progress Notes (Deleted)
No show

## 2017-06-12 ENCOUNTER — Ambulatory Visit: Payer: 59 | Admitting: Endocrinology

## 2017-06-17 ENCOUNTER — Ambulatory Visit (INDEPENDENT_AMBULATORY_CARE_PROVIDER_SITE_OTHER): Payer: 59 | Admitting: Nurse Practitioner

## 2017-06-17 ENCOUNTER — Encounter: Payer: Self-pay | Admitting: Nurse Practitioner

## 2017-06-17 VITALS — BP 122/70 | HR 86 | Temp 98.6°F | Ht 68.0 in | Wt 273.2 lb

## 2017-06-17 DIAGNOSIS — IMO0002 Reserved for concepts with insufficient information to code with codable children: Secondary | ICD-10-CM

## 2017-06-17 DIAGNOSIS — R300 Dysuria: Secondary | ICD-10-CM | POA: Diagnosis not present

## 2017-06-17 DIAGNOSIS — E1165 Type 2 diabetes mellitus with hyperglycemia: Secondary | ICD-10-CM | POA: Diagnosis not present

## 2017-06-17 DIAGNOSIS — E1129 Type 2 diabetes mellitus with other diabetic kidney complication: Secondary | ICD-10-CM

## 2017-06-17 LAB — POCT URINALYSIS DIPSTICK
BILIRUBIN UA: NEGATIVE
GLUCOSE UA: NEGATIVE
KETONES UA: NEGATIVE
Leukocytes, UA: NEGATIVE
Nitrite, UA: NEGATIVE
SPEC GRAV UA: 1.02 (ref 1.010–1.025)
UROBILINOGEN UA: 0.2 U/dL
pH, UA: 6 (ref 5.0–8.0)

## 2017-06-17 LAB — POCT GLYCOSYLATED HEMOGLOBIN (HGB A1C): Hemoglobin A1C: 9.4

## 2017-06-17 NOTE — Progress Notes (Signed)
Subjective:  Patient ID: Gail Barr, female    DOB: September 11, 1965  Age: 52 y.o. MRN: [id number]  CC: Follow-up (F/U for DM, slight headaches, vision has imporved. not taking metformin due to side effects. )   Dysuria   This is a new problem. The current episode started in the past 7 days. The problem has been unchanged. The quality of the pain is described as aching and burning. There has been no fever. She is sexually active. There is no history of pyelonephritis. Associated symptoms include urgency. Pertinent negatives include no chills, discharge, flank pain, frequency, hematuria, hesitancy, nausea, possible pregnancy, sweats or vomiting. She has tried nothing for the symptoms.   DM: Home glucose: AM 130-150 PM 140-180. Unable to tolerate metformin after 1week of use, caused ABD pain and diarrhea despite decreased dose. Denies any hypoglycemia. Reports she had to reschedule endocrinology appt due to scheduling error on her part.  Outpatient Medications Prior to Visit  Medication Sig Dispense Refill  . B-D UF III MINI PEN NEEDLES 31G X 5 MM MISC USE AS DIRECTED AT BEDTIME  2  . glucose blood test strip Check blood glucose 4 time daily (before meals and at bed time). E11.29. One touch Veiro meter. 100 each 12  . Insulin Detemir (LEVEMIR FLEXTOUCH) 100 UNIT/ML Pen Inject 12 Units into the skin daily at 10 pm. 2 pen 5  . Insulin Pen Needle (PEN NEEDLES) 31G X 6 MM MISC 1 application by Does not apply route at bedtime. 100 each 2  . Lancet Device MISC Check blood glucose 4 time daily (before meals and at bed time). E11.29. One touch Veiro meter. 100 each 12  . Multiple Vitamins-Minerals (MULTIVITAL PO) Take by mouth.    Glory Rosebush DELICA LANCETS 27C MISC CHECK BLOOD GLUCOSE 4 TIME DAILY (BEFORE MEALS AND AT BED TIME). E11.29. ONE TOUCH VEIRO METER.  12  . SYNTHROID 200 MCG tablet Take 1 tablet (200 mcg total) by mouth daily before breakfast. 30 tablet 2  . SYNTHROID 50 MCG tablet Take 2  tablets (100 mcg total) by mouth daily before breakfast. 30 tablet 2  . insulin aspart (NOVOLOG FLEXPEN) 100 UNIT/ML FlexPen Insulin (Novolog) sliding scale for additional Novolog: administer 56minutes before meals.  Sugar <150 - no additional units Sugar <151-200 - 2 additional units Sugar <200-250 - 6 additional units Sugar <251-300 - 8 additional units Sugar <351-350 - 10 additional units and call office. Sugar >400 - call office. 3 mL 5  . acetaminophen (TYLENOL) 500 MG tablet Take 1 tablet (500 mg total) by mouth every 8 (eight) hours as needed. (Patient not taking: Reported on 06/17/2017) 30 tablet 0  . ramipril (ALTACE) 2.5 MG capsule Take 1 capsule (2.5 mg total) by mouth daily. (Patient not taking: Reported on 06/17/2017) 30 capsule 5  . metFORMIN (GLUCOPHAGE) 850 MG tablet Take 0.5 tablets (425 mg total) by mouth daily. With food (Patient not taking: Reported on 06/17/2017) 60 tablet 5  . ondansetron (ZOFRAN) 4 MG tablet Take 1 tablet (4 mg total) by mouth every 8 (eight) hours as needed for nausea or vomiting. (Patient not taking: Reported on 06/17/2017) 30 tablet 0  . promethazine (PHENERGAN) 25 MG tablet Take 0.5 tablets (12.5 mg total) by mouth every 8 (eight) hours as needed for nausea or vomiting. (Patient not taking: Reported on 06/17/2017) 20 tablet 0  . rizatriptan (MAXALT) 5 MG tablet Take 1 tablet (5 mg total) by mouth every 6 (six) hours as needed for migraine. May  repeat in 2 hours if needed (Patient not taking: Reported on 06/17/2017) 15 tablet 0   No facility-administered medications prior to visit.     ROS See HPI  Objective:  BP 122/70 (BP Location: Left Arm, Patient Position: Sitting, Cuff Size: Large)   Pulse 86   Temp 98.6 F (37 C) (Oral)   Ht 5\' 8"  (1.727 m)   Wt 273 lb 3.2 oz (123.9 kg)   SpO2 96%   BMI 41.54 kg/m   BP Readings from Last 3 Encounters:  06/17/17 122/70  05/14/17 126/80  05/06/17 118/76    Wt Readings from Last 3 Encounters:  06/17/17  273 lb 3.2 oz (123.9 kg)  05/14/17 281 lb (127.5 kg)  05/06/17 279 lb (126.6 kg)    Physical Exam  Constitutional: She is oriented to person, place, and time.  Neck: Thyromegaly present.  Cardiovascular: Normal rate.  Pulmonary/Chest: Effort normal and breath sounds normal.  Abdominal: Soft. Bowel sounds are normal. She exhibits no distension. There is tenderness.  Neurological: She is alert and oriented to person, place, and time.  Skin: Skin is warm and dry. No erythema.  Psychiatric: She has a normal mood and affect. Her behavior is normal. Thought content normal.  Vitals reviewed.   Lab Results  Component Value Date   WBC 6.6 05/06/2017   HGB 13.9 05/06/2017   HCT 40.8 05/06/2017   PLT 282.0 05/06/2017   GLUCOSE 231 (H) 05/06/2017   CHOL 165 04/23/2015   TRIG 166 (H) 04/23/2015   HDL 32 (L) 04/23/2015   LDLCALC 100 04/23/2015   ALT 16 04/24/2017   AST 11 04/24/2017   NA 136 05/06/2017   K 4.9 05/06/2017   CL 99 05/06/2017   CREATININE 1.78 (H) 05/06/2017   BUN 20 05/06/2017   CO2 29 05/06/2017   TSH 71.38 (H) 05/06/2017   INR 1.20 11/02/2015   HGBA1C 9.4% 06/17/2017   MICROALBUR 41.5 (H) 04/24/2017    Ct Head Wo Contrast  Result Date: 05/18/2017 CLINICAL DATA:  Migraine headaches and double vision. EXAM: CT HEAD WITHOUT CONTRAST TECHNIQUE: Contiguous axial images were obtained from the base of the skull through the vertex without intravenous contrast. COMPARISON:  June 24, 2007, MRI of brain November 02, 2015 FINDINGS: Brain: No evidence of acute infarction, hemorrhage, hydrocephalus, extra-axial collection or mass lesion/mass effect. Vascular: No hyperdense vessel or unexpected calcification. Skull: Normal. Negative for fracture or focal lesion. Sinuses/Orbits: No acute finding. Other: None. IMPRESSION: Normal head CT. Electronically Signed   By: Abelardo Diesel M.D.   On: 05/18/2017 14:39    Assessment & Plan:   Tauheedah was seen today for follow-up.  Diagnoses and  all orders for this visit:  Type 2 diabetes mellitus, uncontrolled, with renal complications ([facility]) -     POCT glycosylated hemoglobin (Hb A1C)  Dysuria -     POCT urinalysis dipstick -     Urine Culture   I have discontinued Wafaa Dugue's rizatriptan, promethazine, insulin aspart, ondansetron, and metFORMIN. I am also having her maintain her Multiple Vitamins-Minerals (MULTIVITAL PO), acetaminophen, Pen Needles, Insulin Detemir, ramipril, B-D UF III MINI PEN NEEDLES, SYNTHROID, SYNTHROID, glucose blood, Lancet Device, and ONETOUCH DELICA LANCETS 72C.  No orders of the defined types were placed in this encounter.   Follow-up: Return in about 6 months (around 12/18/2017) for CPE (fasting).  Wilfred Lacy, NP

## 2017-06-17 NOTE — Patient Instructions (Addendum)
Urine sent for culture  Resume ramipril as prescribed.  Improving HgbA1c  Maintain upcoming appt with endocrinology 07/09/2017 at 4pm.  Continue to hold metfomin.  Continue levemir at current dose till seen by endocrinology.

## 2017-06-18 LAB — URINE CULTURE
MICRO NUMBER: 90382458
SPECIMEN QUALITY: ADEQUATE

## 2017-07-06 ENCOUNTER — Telehealth: Payer: Self-pay | Admitting: Nurse Practitioner

## 2017-07-06 DIAGNOSIS — E039 Hypothyroidism, unspecified: Secondary | ICD-10-CM

## 2017-07-06 MED ORDER — SYNTHROID 50 MCG PO TABS: 100.0000 ug | ORAL_TABLET | Freq: Every day | ORAL | 2 refills | Status: DC

## 2017-07-06 NOTE — Telephone Encounter (Signed)
Copied from [place] [phone]. Topic: Quick Communication - Rx Refill/Question >> Jul 06, 2017 12:54 PM Scherrie Gerlach wrote: Medication: SYNTHROID 50 MCG tablet [facility] calling to follow up on a Rx not going through, and we did not receive. Requesting 90 day sent to:  [id number] - [place], [place] [address] [phone] (Phone) [phone] (Fax)

## 2017-07-09 ENCOUNTER — Encounter: Payer: Self-pay | Admitting: Endocrinology

## 2017-07-09 ENCOUNTER — Ambulatory Visit (INDEPENDENT_AMBULATORY_CARE_PROVIDER_SITE_OTHER): Payer: 59 | Admitting: Endocrinology

## 2017-07-09 VITALS — BP 120/82 | HR 72 | Ht 68.0 in | Wt 271.0 lb

## 2017-07-09 DIAGNOSIS — E039 Hypothyroidism, unspecified: Secondary | ICD-10-CM

## 2017-07-09 DIAGNOSIS — E782 Mixed hyperlipidemia: Secondary | ICD-10-CM

## 2017-07-09 DIAGNOSIS — E1165 Type 2 diabetes mellitus with hyperglycemia: Secondary | ICD-10-CM | POA: Diagnosis not present

## 2017-07-09 MED ORDER — SEMAGLUTIDE(0.25 OR 0.5MG/DOS) 2 MG/1.5ML ~~LOC~~ SOPN: 0.5000 mg | PEN_INJECTOR | SUBCUTANEOUS | 2 refills | Status: DC

## 2017-07-09 NOTE — Patient Instructions (Signed)
Check blood sugars on waking up daily for now until you are off insulin and then every other day  Also check blood sugars about 2 hours after a meal and do this after different meals by rotation  Recommended blood sugar levels on waking up is 90-130 and about 2 hours after meal is 130-160  Please bring your blood sugar monitor to each visit, thank you  Start OZEMPIC 0.25mg  using the pen as shown once weekly on the same day of the week.   You may inject in the stomach, thigh or arm as indicated in the brochure given. If you have any difficulties using the pen see the video at [employer].co.za  You will feel fullness of the stomach with starting the medication and should try to keep the portions at meals small.  You may experience nausea in the first few days which usually gets better over time    After 4 weeks if no nausea increase the dose to 0.5 mg  If you have any questions or concerns please call the office   You may also talk to a nurse educator with [employer] at [phone] Useful website: [email or web address]   LEVEMIR insulin start taking 10 units only the day you start Ozempic After every 3 to 4 days if your blood sugars are still running around 100 before meals you can cut down the dose by another 2 units until you are down to 6 units  Once you are on 6 units and the sugars are still low normal he can stop insulin No need to take NovoLog

## 2017-07-09 NOTE — Progress Notes (Signed)
Patient ID: Gail Barr, female   DOB: May 23, 1965, 52 y.o.   MRN: [id number]          Reason for Appointment: Endocrinology consultation  Referring physician: Wilfred Lacy   History of Present Illness:          Date of diagnosis of type 2 diabetes mellitus:  2015    Background history:   She had an upper normal A1c of 6.5 in 2014 and then subsequently A1c was in the diabetic range Appears that her blood sugars have been poorly controlled with progressive rise in A1c Follow-up for diabetes has been infrequent and she has not had a consistent primary care provider also She had been treated with metformin initially Subsequently had been on Farxiga in 2016 and also Invokamet subsequently   Recent history:   INSULIN regimen is: Levemir 12 U hs       Non-insulin hypoglycemic drugs the patient is taking are: None  Current management, blood sugar patterns and problems identified:  She had not been followed for her diabetes in 2018 and in 2/19 had marked hyperglycemia when she presented to the emergency room for a headache and blurred vision with glucose of 539.  Since then she had been on insulin with Levemir at night and NovoLog as needed for blood sugars over 150  She was also tried on metformin 850 mg but she could not tolerate this because of diarrhea and abdominal pain  Although she has been exercising regularly for the last few months she is watching her diet and cutting back on high fat foods more consistently she had hyperglycemia  She did not bring her monitor but she thinks that her blood sugars are generally excellent except right after supper and in the mornings  Has not had any lab glucose testing since 2/19  Her insulin dose has not been changed for some time  She thinks she is not taking NovoLog much at all because most of her readings before meals have been under 150        Side effects from medications have been: Diarrhea from metformin  Compliance with the  medical regimen: Good  Hypoglycemia: None but she feels a little shaky when blood sugars are close to 100   Glucose monitoring:  done 1-2 times a day         Glucometer: One Touch.       Blood Glucose readings by time of day by recall   PREMEAL Breakfast Lunch Dinner Bedtime  Overall   Glucose range:  140-170  102 120   Median:        POST-MEAL PC Breakfast PC Lunch PC Dinner  Glucose range:   <180  Median:      Self-care: The diet that the patient has been following is: tries to limit high-fat foods.      Typical meal intake: Breakfast is eggs with spinach Lunch usually [place] salad and pita bread Dinner is rice or pasta dishes or a small snack               Dietician visit, most recent: None                Exercise:  3 days a week, going to the gym for 45-60 minutes with various exercising and including aerobic  Weight history:  Wt Readings from Last 3 Encounters:  07/09/17 271 lb (122.9 kg)  06/17/17 273 lb 3.2 oz (123.9 kg)  05/14/17 281 lb (127.5 kg)    Glycemic  control:   Lab Results  Component Value Date   HGBA1C 9.4% 06/17/2017   HGBA1C 11.2 (H) 04/24/2017   HGBA1C 8.0 (H) 04/23/2015   Lab Results  Component Value Date   MICROALBUR 41.5 (H) 04/24/2017   LDLCALC 100 04/23/2015   CREATININE 1.78 (H) 05/06/2017   Lab Results  Component Value Date   MICRALBCREAT 69.7 (H) 04/24/2017    No results found for: FRUCTOSAMINE   Problem 2:  HYPOTHYROIDISM  She was diagnosed to have hypothyroidism soon after her pregnancy around the year 1995 At that time she had a swelling in her thyroid and no other significant symptoms She was started on thyroid supplements and has been on these and variable doses over the last several years More recently she has been taking mostly 200 mcg She thinks that when her thyroid levels are lower when she is not taking her levothyroxine regularly she only has some difficulty with remembering things and some thinning of her hair  but usually no cold intolerance or unusual fatigue She also tends to have more constipation  Because of lack of her having a primary care physician she did not have a prescription for levothyroxine between 11/2016 and 04/2017 At this time she was only having mild symptoms as above  Although she was initially started on 200 mcg on 04/24/2017 because of her TSH being higher 2 weeks later another 50 mcg was added She has not had any follow-up since then She does feel fairly good with no difficulty with concentration, memory or hair loss  She is quite consistent with taking her levothyroxine with water in the morning before breakfast  GOITER: She has also had swelling in her thyroid for several years and her ultrasound shows only heterogenous tissue and no nodules   Lab Results  Component Value Date   TSH 71.38 (H) 05/06/2017   TSH 37.16 (H) 04/24/2017   TSH 18.604 (H) 04/23/2015   FREET4 0.14 (L) 04/24/2017       Allergies as of 07/09/2017      Reactions   Ibuprofen Shortness Of Breath, Swelling, Anaphylaxis   And chest pain.  Tolerates naproxen without problems   Iodine Anaphylaxis   Betadine [povidone Iodine]    Sob, swelling   Iohexol     Desc: Pt states [place] 5 [place] and experienced severe sob and swelling. We did her scan w/o iv contrast today.        Thanks., Onset Date: [id number]      Medication List        Accurate as of 07/09/17  4:50 PM. Always use your most recent med list.          acetaminophen 500 MG tablet Commonly known as:  TYLENOL Take 1 tablet (500 mg total) by mouth every 8 (eight) hours as needed.   glucose blood test strip Check blood glucose 4 time daily (before meals and at bed time). E11.29. One touch Veiro meter.   insulin aspart 100 UNIT/ML FlexPen Commonly known as:  NOVOLOG Inject into the skin 3 (three) times daily with meals.   Insulin Detemir 100 UNIT/ML Pen Commonly known as:  LEVEMIR FLEXTOUCH Inject 12  Units into the skin daily at 10 pm.   Lancet Device Misc Check blood glucose 4 time daily (before meals and at bed time). E11.29. One touch Veiro meter.   MULTIVITAL PO Take by mouth.   ONETOUCH DELICA LANCETS 45Y Misc CHECK BLOOD GLUCOSE 4 TIME  DAILY (BEFORE MEALS AND AT BED TIME). E11.29. ONE TOUCH VEIRO METER.   Pen Needles 31G X 6 MM Misc 1 application by Does not apply route at bedtime.   B-D UF III MINI PEN NEEDLES 31G X 5 MM Misc Generic drug:  Insulin Pen Needle USE AS DIRECTED AT BEDTIME   Semaglutide 0.25 or 0.5 MG/DOSE Sopn Commonly known as:  OZEMPIC Inject 0.5 mg into the skin once a week.   SYNTHROID 200 MCG tablet Generic drug:  levothyroxine Take 1 tablet (200 mcg total) by mouth daily before breakfast.   SYNTHROID 50 MCG tablet Generic drug:  levothyroxine Take 2 tablets (100 mcg total) by mouth daily before breakfast.       Allergies:  Allergies  Allergen Reactions  . Ibuprofen Shortness Of Breath, Swelling and Anaphylaxis    And chest pain.  Tolerates naproxen without problems  . Iodine Anaphylaxis  . Betadine [Povidone Iodine]     Sob, swelling  . Iohexol      Desc: Pt states she had a CT [place] approx 5 yrs ago and experienced severe sob and swelling. We did her scan w/o iv contrast today.        Thanks., Onset Date: [id number]     Past Medical History:  Diagnosis Date  . Abnormal Pap smear early 2000's   had colposcopy, no additional treatment. normal paps since  . Chronic kidney disease    one kidney  . Diabetes mellitus without complication ([place])   . History of chickenpox   . Hypothyroidism 1993  . Multiple thyroid nodules   . Pyelonephritis 1993   when pregnant; subsequently found to have shrunken, nonfunctioning kidney on one side  . Unilateral renal atrophy     Past Surgical History:  Procedure Laterality Date  . DILITATION & CURRETTAGE/HYSTROSCOPY WITH NOVASURE ABLATION N/A 03/02/2013   Procedure: DILATATION &  CURETTAGE/HYSTEROSCOPY WITH NOVASURE ABLATION;  Surgeon: Olga Millers, MD;  Location: [facility];  Service: Gynecology;  Laterality: N/A;    Family History  Problem Relation Age of Onset  . Asthma Brother   . Diabetes Maternal Aunt   . Diabetes Maternal Uncle   . Diabetes Maternal Grandmother   . Cancer Maternal Grandfather        prostate cancer  . Asthma Brother     Social History:  reports that she has never smoked. She has never used smokeless tobacco. She reports that she drinks alcohol. She reports that she does not use drugs.   Review of Systems  Constitutional: Positive for weight loss.  HENT: Negative for trouble swallowing.   Eyes: Negative for blurred vision.  Respiratory: Negative for shortness of breath.   Cardiovascular: Negative for leg swelling.  Gastrointestinal: Positive for constipation.  Endocrine: Positive for menstrual changes. Negative for light-headedness.       Has had amenorrhea since her ablation, no hot flashes  Genitourinary: Negative for frequency.  Musculoskeletal: Negative for joint pain.  Skin: Negative for rash.  Neurological: Negative for tingling.  Psychiatric/Behavioral: Negative for insomnia.     Lipid history: No recent labs available    Lab Results  Component Value Date   CHOL 165 04/23/2015   HDL 32 (L) 04/23/2015   LDLCALC 100 04/23/2015   TRIG 166 (H) 04/23/2015   CHOLHDL 5.2 (H) 04/23/2015           Hypertension: none, had been on ramipril but she does not think she is taking this for some time  BP Readings from  Last 3 Encounters:  07/09/17 120/82  06/17/17 122/70  05/14/17 126/80    Most recent eye exam was in 3/19  Most recent foot exam:4/19    LABS:  No visits with results within 1 Week(s) from this visit.  Latest known visit with results is:  Office Visit on 06/17/2017  Component Date Value Ref Range Status  . Color, UA 06/17/2017 yellow   Final  . Clarity, UA 06/17/2017 clear   Final  . Glucose, UA  06/17/2017 neg   Final  . Bilirubin, UA 06/17/2017 neg   Final  . Ketones, UA 06/17/2017 neg   Final  . Spec Grav, UA 06/17/2017 1.020  1.010 - 1.025 Final  . Blood, UA 06/17/2017 3+   Final   200 ery  . pH, UA 06/17/2017 6.0  5.0 - 8.0 Final  . Protein, UA 06/17/2017 1+   Final   30mg   . Urobilinogen, UA 06/17/2017 0.2  0.2 or 1.0 E.U./dL Final  . Nitrite, UA 06/17/2017 neg   Final  . Leukocytes, UA 06/17/2017 Negative  Negative Final  . Hemoglobin A1C 06/17/2017 9.4%   Final  . MICRO NUMBER: 06/17/2017 [id number]   Final  . SPECIMEN QUALITY: 06/17/2017 ADEQUATE   Final  . Sample Source 06/17/2017 URINE   Final  . STATUS: 06/17/2017 FINAL   Final  . ISOLATE 1: 06/17/2017 Single organism less than 10,000 CFU/mL isolated. These organisms, commonly found on external and internal genitalia, are considered colonizers. No further testing performed.   Final    Physical Examination:  BP 120/82 (BP Location: Left Arm, Patient Position: Sitting, Cuff Size: Large)   Pulse 72   Ht 5\' 8"  (1.727 m)   Wt 271 lb (122.9 kg)   SpO2 98%   BMI 41.21 kg/m   GENERAL:         Patient has generalized obesity.    HEENT:         Eye exam shows normal external appearance.  Fundus exam shows no retinopathy.  Oral exam shows normal mucosa .  NECK:   There is no lymphadenopathy Thyroid is diffusely enlarged, very firm and slightly rubbery in texture, about 2-2.5 times normal, slightly larger on the right and no nodules felt.   Carotids are normal to palpation and no bruit heard  LUNGS:         Chest is symmetrical. Lungs are clear to auscultation.Marland Kitchen   HEART:         Heart sounds:  S1 and S2 are normal. No murmur or click heard., no S3 or S4.   ABDOMEN:   There is no distention present. Liver and spleen are not palpable. No other mass or tenderness present.    NEUROLOGICAL:   Ankle jerks are absent bilaterally, biceps reflexes appear normal.    Diabetic Foot Exam - Simple   Simple Foot Form Diabetic  Foot exam was performed with the following findings:  Yes 07/09/2017  4:50 PM  Visual Inspection No deformities, no ulcerations, no other skin breakdown bilaterally:  Yes Sensation Testing Intact to touch and monofilament testing bilaterally:  Yes Pulse Check Posterior Tibialis and Dorsalis pulse intact bilaterally:  Yes Comments            Vibration sense is mildly reduced in distal first toes. MUSCULOSKELETAL:  There is no swelling or deformity of the peripheral joints.     EXTREMITIES:     There is no edema.  SKIN:       No rash or lesions  of concern.        ASSESSMENT:  Diabetes type 2, current BMI 41  She is reporting fairly good blood sugars except fasting with a regimen of only 12 units of Levemir insulin as monotherapy This is despite her having marked increase in blood sugars in 04/2017 However has been able to lose weight with continued modification of her diet and exercise She is intolerant to metformin   Complications of diabetes: She has microalbuminuria but this was checked when she was having marked hyperglycemia  HYPOTHYROIDISM: She is likely has Hashimoto's thyroiditis with a goiter, duration 25 years She has had minimal symptoms despite fairly significant hypothyroidism and only started thyroid supplementation back again in early February after being off the medication on her own for a few months She is not having any of her usual symptoms now and will need to assess her thyroid levels to adjust her dosage  RENAL insufficiency: Although she has a single kidney recently had worsening of renal function, etiology unclear, to have labs checked again Will forward labs to PCP when available  ?  Hyperlipidemia: Has not been assessed for some time and will need to do fasting labs on the next visit  PLAN for diabetes:    She is a good candidate for a GLP-1 drug for improved control, weight loss and may be able to wean off her insulin completely Discussed with the  patient the nature of GLP-1 drugs, the action on various organ systems, how they benefit blood glucose control, as well as the benefit of weight loss and  increase satiety . Explained possible side effects, particularly nausea and vomiting that usually resolve over time; discussed safety information in package insert. Demonstrated the medication injection device and injection technique to the patient.  Showed patient where to inject the medication. To start with Ozempic 0.25 mg dosage weekly for the first 4 weeks and if tolerated go up to 0.5 mg afterwards Patient brochure on [employer] with enclosed co-pay card given  Start tapering down Levemir insulin slowly and will go down to 10 units in the beginning  Most likely should be able to stop this once Ozempic is effective  Consultation with dietitian for meal planning, needs balanced meals and moderation of carbohydrate intake  She needs to check blood sugars consistently about 2 hours after meals in addition to fasting readings and bring monitor for download on each visit  Will need repeat microalbumin for urine  To get reports of eye exam  Patient Instructions  Check blood sugars on waking up daily for now until you are off insulin and then every other day  Also check blood sugars about 2 hours after a meal and do this after different meals by rotation  Recommended blood sugar levels on waking up is 90-130 and about 2 hours after meal is 130-160  Please bring your blood sugar monitor to each visit, thank you  Start OZEMPIC 0.25mg  using the pen as shown once weekly on the same day of the week.   You may inject in the stomach, thigh or arm as indicated in the brochure given. If you have any difficulties using the pen see the video at CompPlans.co.za  You will feel fullness of the stomach with starting the medication and should try to keep the portions at meals small.  You may experience nausea in the first few days which usually  gets better over time    After 4 weeks if no nausea increase the dose to 0.5  mg  If you have any questions or concerns please call the office   You may also talk to a nurse educator with [employer] at [phone] Useful website: [email or web address]   LEVEMIR insulin start taking 10 units only the day you start Ozempic After every 3 to 4 days if your blood sugars are still running around 100 before meals you can cut down the dose by another 2 units until you are down to 6 units  Once you are on 6 units and the sugars are still low normal he can stop insulin No need to take NovoLog        Consultation note has been sent to the referring physician  Elayne Snare 07/09/2017, 4:50 PM   Note: This office note was prepared with Dragon voice recognition system technology. Any transcriptional errors that result from this process are unintentional.   ADDENDUM: Renal function back to normal TSH still over 40, needs new dose of 325 mcg instead of 250.  She will take 1/2 tablet of the 50 mcg along with 1-1/2 tablets of 200 mcg levothyroxine  Elayne Snare

## 2017-07-10 ENCOUNTER — Encounter (INDEPENDENT_AMBULATORY_CARE_PROVIDER_SITE_OTHER): Payer: Self-pay

## 2017-07-10 LAB — FRUCTOSAMINE: Fructosamine: 277 umol/L (ref 0–285)

## 2017-07-10 LAB — BASIC METABOLIC PANEL WITH GFR
BUN: 14 mg/dL (ref 7–25)
CO2: 24 mmol/L (ref 20–32)
Calcium: 9.6 mg/dL (ref 8.6–10.2)
Chloride: 104 mmol/L (ref 98–110)
Creat: 1.08 mg/dL (ref 0.50–1.10)
Glucose, Bld: 77 mg/dL (ref 65–99)
Potassium: 5.8 mmol/L — ABNORMAL HIGH (ref 3.5–5.3)
Sodium: 140 mmol/L (ref 135–146)

## 2017-07-10 LAB — URINALYSIS, ROUTINE W REFLEX MICROSCOPIC
BILIRUBIN URINE: NEGATIVE
Bacteria, UA: NONE SEEN /HPF
Glucose, UA: NEGATIVE
Hgb urine dipstick: NEGATIVE
Hyaline Cast: NONE SEEN /LPF
KETONES UR: NEGATIVE
Nitrite: NEGATIVE
PH: 5.5 (ref 5.0–8.0)
Protein, ur: NEGATIVE
RBC / HPF: NONE SEEN /HPF (ref 0–2)
SPECIFIC GRAVITY, URINE: 1.014 (ref 1.001–1.03)

## 2017-07-10 LAB — TSH: TSH: 44.91 m[IU]/L — ABNORMAL HIGH

## 2017-07-10 LAB — T4, FREE: Free T4: 0.4 ng/dL — ABNORMAL LOW (ref 0.8–1.8)

## 2017-07-10 LAB — SPECIMEN COMPROMISED

## 2017-08-14 ENCOUNTER — Other Ambulatory Visit: Payer: 59

## 2017-08-19 NOTE — Progress Notes (Signed)
Patient ID: Gail Barr, female   DOB: 10/09/1965, 52 y.o.   MRN: [id number]          Reason for Appointment: Endocrinology follow-up  Referring physician: Wilfred Lacy   History of Present Illness:          Date of diagnosis of type 2 diabetes mellitus:  2015    Background history:   She had an upper normal A1c of 6.5 in 2014 and then subsequently A1c was in the diabetic range Appears that her blood sugars have been poorly controlled with progressive rise in A1c Follow-up for diabetes has been infrequent and she has not had a consistent primary care provider also She had been treated with metformin initially Subsequently had been on Farxiga in 2016 and also Invokamet subsequently  In 2/19 she had marked hyperglycemia at the emergency room when seen for headache and blurred vision with glucose of 539.  Recent history:   INSULIN regimen is: Levemir 12 U hs       Non-insulin hypoglycemic drugs the patient is taking are: Ozempic 0.25 mg weekly  A1c last was 9.4 done in March 2019  Current management, blood sugar patterns and problems identified:  She had been taking only basal insulin when first seen in consultation in 4/19  Because of her obesity and only modest hypoglycemia with taking low-dose basal insulin she was started on a GLP-1 drug with Ozempic  She was supposed to go up to 0.5 mg after fourth injection but she has not  She did have vomiting first injection but no nausea since then  She has not been watching her diet well because of eating more sweets except recently  Although she has had a few readings that are markedly increased had only one reading over 200 in the last week  Her blood sugars cannot be compared to her previous visit since she did not have a meter  Currently no consistent pattern seen in her blood sugar even postprandial and her blood sugars are averaging about 125-150 at any given time  She does tend to eat a little less with starting  Ozempic but has not lost any weight  She is still waiting to be seen by the dietitian        Side effects from medications have been: Diarrhea, abdominal pain from metformin  Compliance with the medical regimen: Inconsistent  Hypoglycemia: None but she feels a little shaky when blood sugars are close to 100   Glucose monitoring:  done 1-2 times a day         Glucometer: One Touch.       Blood Glucose readings by   Mean values apply above for all meters except median for One Touch  PRE-MEAL Fasting Lunch Dinner  overnight Overall  Glucose range:  96-?  601    124-183   Mean/median:  141   1 58  141+/-92   POST-MEAL PC Breakfast PC Lunch PC Dinner  Glucose range:    114-214  Mean/median:    150     Self-care: The diet that the patient has been following is: tries to limit high-fat foods.      Typical meal intake: Breakfast is eggs with spinach Lunch usually [place] salad and pita bread Dinner is rice or pasta dishes or a small snack               Dietician visit, most recent: None  Exercise:  5 days a week, going to the gym for 45-60 minutes with various exercising and including aerobic  Weight history:  Wt Readings from Last 3 Encounters:  08/20/17 272 lb 12.8 oz (123.7 kg)  07/09/17 271 lb (122.9 kg)  06/17/17 273 lb 3.2 oz (123.9 kg)    Glycemic control:   Lab Results  Component Value Date   HGBA1C 9.4% 06/17/2017   HGBA1C 11.2 (H) 04/24/2017   HGBA1C 8.0 (H) 04/23/2015   Lab Results  Component Value Date   MICROALBUR 41.5 (H) 04/24/2017   LDLCALC 133 (H) 08/20/2017   CREATININE 1.08 07/09/2017   Lab Results  Component Value Date   MICRALBCREAT 69.7 (H) 04/24/2017    Lab Results  Component Value Date   FRUCTOSAMINE 277 07/09/2017     Problem 2:  HYPOTHYROIDISM  She was diagnosed to have hypothyroidism soon after her pregnancy around the year 1995 At that time she had a swelling in her thyroid and no other significant  symptoms She was started on thyroid supplements and has been on these and variable doses over the last several years More recently she has been taking mostly 200 mcg She thinks that when her thyroid levels are lower when she is not taking her levothyroxine regularly she only has some difficulty with remembering things and some thinning of her hair but usually no cold intolerance or unusual fatigue She also tends to have more constipation  Because of lack of her having a primary care physician she did not have a prescription for levothyroxine between 11/2016 and 2/2019At this time she was only having mild symptoms as above  Although she was initially started on 200 mcg on 04/24/2017 because of her TSH being higher 2 weeks later another 50 mcg was added She does not feel any difference with restarting her thyroid supplement or increasing the dose although may have been a little better but than focusing with taking the medication  She is currently taking a total of 250 mcg levothyroxine daily  She is now quite consistent with taking her levothyroxine with water in the morning before breakfast  GOITER: She has also had swelling in her thyroid for several years and her ultrasound shows only heterogenous tissue and no nodules   Lab Results  Component Value Date   TSH 45.85 (H) 08/20/2017   TSH 44.91 (H) 07/09/2017   TSH 71.38 (H) 05/06/2017   FREET4 0.22 (L) 08/20/2017   FREET4 0.4 (L) 07/09/2017   FREET4 0.14 (L) 04/24/2017       Allergies as of 08/20/2017      Reactions   Ibuprofen Shortness Of Breath, Swelling, Anaphylaxis   And chest pain.  Tolerates naproxen without problems   Iodine Anaphylaxis   Betadine [povidone Iodine]    Sob, swelling   Iohexol     Desc: Pt states [place] 5 [place] and experienced severe sob and swelling. We did her scan w/o iv contrast today.        Thanks., Onset Date: [id number]      Medication List        Accurate as of 08/20/17   2:46 PM. Always use your most recent med list.          glucose blood test strip Check blood glucose 4 time daily (before meals and at bed time). E11.29. One touch Veiro meter.   Insulin Detemir 100 UNIT/ML Pen Commonly known as:  LEVEMIR FLEXTOUCH Inject 12 Units into  the skin daily at 10 pm.   Lancet Device Misc Check blood glucose 4 time daily (before meals and at bed time). E11.29. One touch Veiro meter.   MULTIVITAL PO Take by mouth.   ONETOUCH DELICA LANCETS 43X Misc CHECK BLOOD GLUCOSE 4 TIME DAILY (BEFORE MEALS AND AT BED TIME). E11.29. ONE TOUCH VEIRO METER.   Pen Needles 31G X 6 MM Misc 1 application by Does not apply route at bedtime.   B-D UF III MINI PEN NEEDLES 31G X 5 MM Misc Generic drug:  Insulin Pen Needle USE AS DIRECTED AT BEDTIME   Semaglutide 0.25 or 0.5 MG/DOSE Sopn Commonly known as:  OZEMPIC Inject 0.5 mg into the skin once a week.   SYNTHROID 200 MCG tablet Generic drug:  levothyroxine Take 1 tablet (200 mcg total) by mouth daily before breakfast.   SYNTHROID 50 MCG tablet Generic drug:  levothyroxine Take 2 tablets (100 mcg total) by mouth daily before breakfast.       Allergies:  Allergies  Allergen Reactions  . Ibuprofen Shortness Of Breath, Swelling and Anaphylaxis    And chest pain.  Tolerates naproxen without problems  . Iodine Anaphylaxis  . Betadine [Povidone Iodine]     Sob, swelling  . Iohexol      Desc: Pt states she had a CT [place] approx 5 yrs ago and experienced severe sob and swelling. We did her scan w/o iv contrast today.        Thanks., Onset Date: [id number]     Past Medical History:  Diagnosis Date  . Abnormal Pap smear early 2000's   had colposcopy, no additional treatment. normal paps since  . Chronic kidney disease    one kidney  . Diabetes mellitus without complication ([place])   . History of chickenpox   . Hypothyroidism 1993  . Multiple thyroid nodules   . Pyelonephritis 1993   when pregnant;  subsequently found to have shrunken, nonfunctioning kidney on one side  . Unilateral renal atrophy     Past Surgical History:  Procedure Laterality Date  . DILITATION & CURRETTAGE/HYSTROSCOPY WITH NOVASURE ABLATION N/A 03/02/2013   Procedure: DILATATION & CURETTAGE/HYSTEROSCOPY WITH NOVASURE ABLATION;  Surgeon: Olga Millers, MD;  Location: [facility];  Service: Gynecology;  Laterality: N/A;    Family History  Problem Relation Age of Onset  . Asthma Brother   . Diabetes Maternal Aunt   . Diabetes Maternal Uncle   . Diabetes Maternal Grandmother   . Cancer Maternal Grandfather        prostate cancer  . Asthma Brother     Social History:  reports that she has never smoked. She has never used smokeless tobacco. She reports that she drinks alcohol. She reports that she does not use drugs.   Review of Systems     Lipid history: No recent labs available    Lab Results  Component Value Date   CHOL 197 08/20/2017   HDL 38.80 (L) 08/20/2017   LDLCALC 133 (H) 08/20/2017   TRIG 125.0 08/20/2017   CHOLHDL 5 08/20/2017           Hypertension: none, had been on ramipril previously  BP Readings from Last 3 Encounters:  08/20/17 134/72  07/09/17 120/82  06/17/17 122/70    Most recent eye exam was in 3/19  Most recent foot exam:4/19    LABS:  Office Visit on 08/20/2017  Component Date Value Ref Range Status  . Cholesterol 08/20/2017 197  0 - 200 mg/dL  Final   ATP III Classification       Desirable:  < 200 mg/dL               Borderline High:  200 - 239 mg/dL          High:  > = 240 mg/dL  . Triglycerides 08/20/2017 125.0  0.0 - 149.0 mg/dL Final   Normal:  <150 mg/dLBorderline High:  150 - 199 mg/dL  . HDL 08/20/2017 38.80* >39.00 mg/dL Final  . VLDL 08/20/2017 25.0  0.0 - 40.0 mg/dL Final  . LDL Cholesterol 08/20/2017 133* 0 - 99 mg/dL Final  . Total CHOL/HDL Ratio 08/20/2017 5   Final                  Men          Women1/2 Average Risk     3.4          3.3Average Risk           5.0          4.42X Average Risk          9.6          7.13X Average Risk          15.0          11.0                      . NonHDL 08/20/2017 157.98   Final   NOTE:  Non-HDL goal should be 30 mg/dL higher than patient's LDL goal (i.e. LDL goal of < 70 mg/dL, would have non-HDL goal of < 100 mg/dL)  . Free T4 08/20/2017 0.22* 0.60 - 1.60 ng/dL Final   Comment: Specimens from patients who are undergoing biotin therapy and /or ingesting biotin supplements may contain high levels of biotin.  The higher biotin concentration in these specimens interferes with this Free T4 assay.  Specimens that contain high levels  of biotin may cause false high results for this Free T4 assay.  Please interpret results in light of the total clinical presentation of the patient.    Marland Kitchen TSH 08/20/2017 45.85* 0.35 - 4.50 uIU/mL Final    Physical Examination:  BP 134/72 (BP Location: Left Arm, Patient Position: Sitting, Cuff Size: Normal)   Pulse 82   Ht 5\' 8"  (1.727 m)   Wt 272 lb 12.8 oz (123.7 kg)   SpO2 96%   BMI 41.48 kg/m       ASSESSMENT:  Diabetes type 2, current BMI 41  See history of present illness for detailed discussion of current diabetes management, blood sugar patterns and problems identified  Baseline A1c is 9.4  Her blood sugars are not significantly better with adding 0.25 mg of Ozempic She is still having relatively high fasting readings and likely cannot stop basal insulin as yet although taking a small dose anyway She does need weight loss, consistent diet and is still waiting for consultation with dietitian Not clear why she has some sporadic high readings over 400 at times, she thinks this is from poor diet   HYPOTHYROIDISM: She is likely has Hashimoto's thyroiditis with a goiter, duration 25 years  She has had minimal symptoms despite marked hypothyroidism Currently on 250 mcg and not very symptomatic Needs follow-up labs today With consider Tirosint having difficulty  getting TSH adequately controlled  ?  Hyperlipidemia: Needs fasting labs today   PLAN for diabetes:    She will dial halfway between  0.25 and 0.5 on her Ozempic on the next injection and then 0.5 weekly  Consider 1 mg on the following visit if no nausea  No change in insulin  Consider switching insulin to account on the next visit  To be consistent with diet and she will discuss further with dietitian  More consistent monitoring after meals, discussed blood sugar targets  Patient Instructions  0.5 Ozempic    Total visit time for evaluation and management of multiple problems and counseling =25 minutes  Elayne Snare 08/20/2017, 2:46 PM   Note: This office note was prepared with Dragon voice recognition system technology. Any transcriptional errors that result from this process are unintentional.   ADDENDUM:   TSH still over 40, needs 2 tablets of 200 mcg daily Tirosint is not on her formulary LDL 133, start Crestor 5 mg daily  Elayne Snare

## 2017-08-20 ENCOUNTER — Other Ambulatory Visit: Payer: Self-pay

## 2017-08-20 ENCOUNTER — Encounter: Payer: Self-pay | Admitting: Endocrinology

## 2017-08-20 ENCOUNTER — Ambulatory Visit (INDEPENDENT_AMBULATORY_CARE_PROVIDER_SITE_OTHER): Payer: 59 | Admitting: Endocrinology

## 2017-08-20 VITALS — BP 134/72 | HR 82 | Ht 68.0 in | Wt 272.8 lb

## 2017-08-20 DIAGNOSIS — E1165 Type 2 diabetes mellitus with hyperglycemia: Secondary | ICD-10-CM

## 2017-08-20 DIAGNOSIS — E039 Hypothyroidism, unspecified: Secondary | ICD-10-CM

## 2017-08-20 DIAGNOSIS — E782 Mixed hyperlipidemia: Secondary | ICD-10-CM

## 2017-08-20 LAB — TSH: TSH: 45.85 u[IU]/mL — AB (ref 0.35–4.50)

## 2017-08-20 LAB — T4, FREE: FREE T4: 0.22 ng/dL — AB (ref 0.60–1.60)

## 2017-08-20 LAB — LIPID PANEL
Cholesterol: 197 mg/dL (ref 0–200)
HDL: 38.8 mg/dL — AB (ref >39.00)
LDL CALC: 133 mg/dL — AB (ref 0–99)
NonHDL: 157.98
TRIGLYCERIDES: 125 mg/dL (ref 0.0–149.0)
Total CHOL/HDL Ratio: 5
VLDL: 25 mg/dL (ref 0.0–40.0)

## 2017-08-20 MED ORDER — SYNTHROID 200 MCG PO TABS: ORAL_TABLET | ORAL | 2 refills | Status: DC

## 2017-08-20 MED ORDER — ROSUVASTATIN CALCIUM 5 MG PO TABS: 5.0000 mg | ORAL_TABLET | Freq: Every day | ORAL | 3 refills | Status: DC

## 2017-08-20 NOTE — Patient Instructions (Signed)
0.5 Ozempic

## 2017-08-25 ENCOUNTER — Ambulatory Visit: Payer: 59 | Admitting: Dietician

## 2017-10-16 ENCOUNTER — Ambulatory Visit: Payer: 59 | Admitting: Endocrinology

## 2017-12-18 ENCOUNTER — Encounter: Payer: Self-pay | Admitting: Nurse Practitioner

## 2017-12-18 ENCOUNTER — Ambulatory Visit (INDEPENDENT_AMBULATORY_CARE_PROVIDER_SITE_OTHER): Payer: 59 | Admitting: Nurse Practitioner

## 2017-12-18 VITALS — BP 126/84 | HR 81 | Temp 98.6°F | Ht 68.0 in | Wt 263.0 lb

## 2017-12-18 DIAGNOSIS — Z0001 Encounter for general adult medical examination with abnormal findings: Secondary | ICD-10-CM

## 2017-12-18 DIAGNOSIS — Z1211 Encounter for screening for malignant neoplasm of colon: Secondary | ICD-10-CM

## 2017-12-18 DIAGNOSIS — R300 Dysuria: Secondary | ICD-10-CM

## 2017-12-18 DIAGNOSIS — Z0000 Encounter for general adult medical examination without abnormal findings: Secondary | ICD-10-CM

## 2017-12-18 LAB — POCT URINALYSIS DIPSTICK
BILIRUBIN UA: NEGATIVE
Blood, UA: NEGATIVE
Glucose, UA: NEGATIVE
KETONES UA: NEGATIVE
Leukocytes, UA: NEGATIVE
Nitrite, UA: NEGATIVE
Protein, UA: POSITIVE — AB
Spec Grav, UA: 1.02 (ref 1.010–1.025)
UROBILINOGEN UA: 0.2 U/dL
pH, UA: 6 (ref 5.0–8.0)

## 2017-12-18 NOTE — Progress Notes (Signed)
Subjective:    Patient ID: Gail Barr, female    DOB: 1965-12-31, 52 y.o.   MRN: [id number]  Patient presents today for complete physical   HPI   Hypothyroidism and DM: Managed by Dr. Dwyane Dee. Last seen 08/20/2017. Reviewed lab done by endocrinology 08/20/2017.  Complains of dysuria, onset 1week. No vaginal symtoms  Sexual History (orientation,birth control, marital status, STD):sexually active, PAP and mammogram done by GYN, will schedule.  Depression/Suicide: Depression screen [facility] 2/9 04/24/2017 02/10/2014  Decreased Interest 0 0  Down, Depressed, Hopeless 0 0  PHQ - 2 Score 0 0   No flowsheet data found.  Vision:up to date, need to have report faxed to [place]  Dental:scheduled to to be done next week  Immunizations: (TDAP, Hep C screen, Pneumovax, Influenza, zoster)  Health Maintenance  Topic Date Due  . Eye exam for diabetics  09/12/1977  . Mammogram  09/12/2017  . Colon Cancer Screening  09/12/2017  . Hemoglobin A1C  12/18/2017  . Flu Shot  12/25/2017*  . HIV Screening  05/06/2018*  . Tetanus Vaccine  06/18/2018*  . Pneumococcal vaccine  12/19/2018*  . Urine Protein Check  04/24/2018  . Complete foot exam   07/10/2018  . Pap Smear  09/06/2018  *Topic was postponed. The date shown is not the original due date.   Diet:low carb/low fat.  Weight:  Wt Readings from Last 3 Encounters:  12/18/17 263 lb (119.3 kg)  08/20/17 272 lb 12.8 oz (123.7 kg)  07/09/17 271 lb (122.9 kg)   Exercise:walking 3x/week  Fall Risk: Fall Risk  04/24/2017 02/10/2014  Falls in the past year? No No   Advanced Directive: Advanced Directives 11/02/2015  Does Patient Have a Medical Advance Directive? No  Type of Advance Directive -  Would patient like information on creating a medical advance directive? No - patient declined information  Pre-existing out of facility DNR order (yellow form or pink MOST form) -     Medications and allergies reviewed with patient and updated if  appropriate.  Patient Active Problem List   Diagnosis Date Noted  . Enlarged thyroid 04/29/2017  . Nausea with vomiting 12/11/2015  . 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 Dizziness 12/02/2015  . Mixed hyperlipidemia 02/12/2015  . Type 2 diabetes mellitus, uncontrolled, with renal complications ([place]) 40/81/4481  . Microalbuminuria 04/04/2014  . Chest pain 01/25/2014  . 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 Bifascicular block 01/25/2014  . Abnormal menses 01/10/2013  . Vitamin D deficiency 07/08/2011  . Hypothyroidism 07/07/2011  . Obesity, Class III, BMI 40-49.9 (morbid obesity) ([name]) 07/07/2011    Current Outpatient Medications on File Prior to Visit  Medication Sig Dispense Refill  . B-D UF III MINI PEN NEEDLES 31G X 5 MM MISC USE AS DIRECTED AT BEDTIME  2  . glucose blood test strip Check blood glucose 4 time daily (before meals and at bed time). E11.29. One touch Veiro meter. 100 each 12  . Insulin Detemir (LEVEMIR FLEXTOUCH) 100 UNIT/ML Pen Inject 12 Units into the skin daily at 10 pm. 2 pen 5  . Insulin Pen Needle (PEN NEEDLES) 31G X 6 MM MISC 1 application by Does not apply route at bedtime. 100 each 2  . Lancet Device MISC Check blood glucose 4 time daily (before meals and at bed time). E11.29. One touch Veiro meter. 100 each 12  . Multiple Vitamins-Minerals (MULTIVITAL PO) Take by mouth.    Glory Rosebush DELICA LANCETS 85U MISC CHECK BLOOD GLUCOSE 4 TIME DAILY (BEFORE  MEALS AND AT BED TIME). E11.29. ONE TOUCH VEIRO METER.  12  . Semaglutide (OZEMPIC) 0.25 or 0.5 MG/DOSE SOPN Inject 0.5 mg into the skin once a week. 1 pen 2  . SYNTHROID 200 MCG tablet Take 2 tablets (435mcg) daily in the morning before breakfast. 60 tablet 2  . rosuvastatin (CRESTOR) 5 MG tablet Take 1 tablet (5 mg total) by mouth daily. (Patient not taking: Reported on 12/18/2017) 30 tablet 3  . SYNTHROID 50 MCG tablet Take 2 tablets (100 mcg total) by mouth daily before breakfast. (Patient not taking:  Reported on 12/18/2017) 30 tablet 2   No current facility-administered medications on file prior to visit.     Past Medical History:  Diagnosis Date  . Abnormal Pap smear early 2000's   had colposcopy, no additional treatment. normal paps since  . Chronic kidney disease    one kidney  . Diabetes mellitus without complication ([place])   . History of chickenpox   . Hypothyroidism 1993  . Multiple thyroid nodules   . Pyelonephritis 1993   when pregnant; subsequently found to have shrunken, nonfunctioning kidney on one side  . Unilateral renal atrophy     Past Surgical History:  Procedure Laterality Date  . DILITATION & CURRETTAGE/HYSTROSCOPY WITH NOVASURE ABLATION N/A 03/02/2013   Procedure: DILATATION & CURETTAGE/HYSTEROSCOPY WITH NOVASURE ABLATION;  Surgeon: Olga Millers, MD;  Location: [facility];  Service: Gynecology;  Laterality: N/A;    Social History   Socioeconomic History  . Marital status: Married    Spouse name: Gail Barr  . Number of children: 3  . Years of education: 61  . Highest education level: Not on file  Occupational History  . Occupation: Freight forwarder for [employer]  Social Needs  . Financial resource strain: Not on file  . Food insecurity:    Worry: Not on file    Inability: Not on file  . Transportation needs:    Medical: Not on file    Non-medical: Not on file  Tobacco Use  . Smoking status: Never Smoker  . Smokeless tobacco: Never Used  Substance and Sexual Activity  . Alcohol use: Yes    Comment: one drink every three months.  . Drug use: No  . Sexual activity: Yes    Partners: Male    Birth control/protection: None  Lifestyle  . Physical activity:    Days per week: Not on file    Minutes per session: Not on file  . Stress: Not on file  Relationships  . Social connections:    Talks on phone: Not on file    Gets together: Not on file    Attends religious service: Not on file    Active member of club or organization: Not on file    Attends meetings  of clubs or organizations: Not on file    Relationship status: Not on file  Other Topics Concern  . Not on file  Social History Narrative   Lives with husband   Son is a police office in [place], has 1 grandchild.     Daughter is a Presenter, broadcasting in [place].     She moved here in 2005 from [place]   Caffeine use: none    Family History  Problem Relation Age of Onset  . Asthma Brother   . Diabetes Maternal Aunt   . Diabetes Maternal Uncle   . Diabetes Maternal Grandmother   . Cancer Maternal Grandfather        prostate cancer  . Asthma Brother  Review of Systems  Constitutional: Negative for fever, malaise/fatigue and weight loss.  HENT: Negative for congestion and sore throat.   Eyes:       Negative for visual changes  Respiratory: Negative for cough and shortness of breath.   Cardiovascular: Negative for chest pain, palpitations and leg swelling.  Gastrointestinal: Negative for blood in stool, constipation, diarrhea and heartburn.  Genitourinary: Positive for dysuria. Negative for frequency and urgency.  Musculoskeletal: Negative for falls, joint pain and myalgias.  Skin: Negative for rash.  Neurological: Negative for dizziness, sensory change and headaches.  Endo/Heme/Allergies: Does not bruise/bleed easily.  Psychiatric/Behavioral: Negative for depression, substance abuse and suicidal ideas. The patient is not nervous/anxious.     Objective:   Vitals:   12/18/17 1012  BP: 126/84  Pulse: 81  Temp: 98.6 F (37 C)  SpO2: 99%    Body mass index is 39.99 kg/m.   Physical Examination:  Physical Exam  Constitutional: She is oriented to person, place, and time. She appears well-developed and well-nourished.  HENT:  Right Ear: External ear normal.  Left Ear: External ear normal.  Mouth/Throat: Oropharynx is clear and moist.  Eyes: Pupils are equal, round, and reactive to light. Conjunctivae and EOM are normal.  Neck: Normal range of motion. Neck supple.  Thyromegaly present.  Cardiovascular: Normal rate, regular rhythm, normal heart sounds and intact distal pulses.  Pulmonary/Chest: Effort normal and breath sounds normal.  Abdominal: Soft. Bowel sounds are normal.  Musculoskeletal: Normal range of motion. She exhibits no edema.  Neurological: She is alert and oriented to person, place, and time.  Skin: No rash noted.  Psychiatric: She has a normal mood and affect. Her behavior is normal. Thought content normal.  Vitals reviewed.   ASSESSMENT and PLAN:  Asja was seen today for annual exam.  Diagnoses and all orders for this visit:  Encounter for preventative adult health care examination  Colon cancer screening -     Ambulatory referral to Gastroenterology  Dysuria -     POCT urinalysis dipstick   No problem-specific Assessment & Plan notes found for this encounter.     Follow up: Return in about 6 months (around 06/18/2018) for hyperlipidemia, DM, Obesity.  Wilfred Lacy, NP

## 2017-12-18 NOTE — Patient Instructions (Addendum)
Please have ophthalmology report, PAP results and mammogram results faxed to me.  Make appt with endocrinology and get labs done with his office as ordered.  You will be contacted to schedule appt with GI for colonoscopy.  Resume regular exercise and heart healthy diet.  Normal urinalysis.  Health Maintenance, Female Adopting a healthy lifestyle and getting preventive care can go a long way to promote health and wellness. Talk with your health care provider about what schedule of regular examinations is right for you. This is a good chance for you to check in with your provider about disease prevention and staying healthy. In between checkups, there are plenty of things you can do on your own. Experts have done a lot of research about which lifestyle changes and preventive measures are most likely to keep you healthy. Ask your health care provider for more information. Weight and diet Eat a healthy diet  Be sure to include plenty of vegetables, fruits, low-fat dairy products, and lean protein.  Do not eat a lot of foods high in solid fats, added sugars, or salt.  Get regular exercise. This is one of the most important things you can do for your health. ? Most adults should exercise for at least 150 minutes each week. The exercise should increase your heart rate and make you sweat (moderate-intensity exercise). ? Most adults should also do strengthening exercises at least twice a week. This is in addition to the moderate-intensity exercise.  Maintain a healthy weight  Body mass index (BMI) is a measurement that can be used to identify possible weight problems. It estimates body fat based on height and weight. Your health care provider can help determine your BMI and help you achieve or maintain a healthy weight.  For females 50 years of age and older: ? A BMI below 18.5 is considered underweight. ? A BMI of 18.5 to 24.9 is normal. ? A BMI of 25 to 29.9 is considered overweight. ? A BMI  of 30 and above is considered obese.  Watch levels of cholesterol and blood lipids  You should start having your blood tested for lipids and cholesterol at 50 years of age, then have this test every 5 years.  You may need to have your cholesterol levels checked more often if: ? Your lipid or cholesterol levels are high. ? You are older than 50 years of age. ? You are at high risk for heart disease.  Cancer screening Lung Cancer  Lung cancer screening is recommended for adults 25-12 years old who are at high risk for lung cancer because of a history of smoking.  A yearly low-dose CT scan of the lungs is recommended for people who: ? Currently smoke. ? Have quit within the past 15 years. ? Have at least a 30-pack-year history of smoking. A pack year is smoking an average of one pack of cigarettes a day for 1 year.  Yearly screening should continue until it has been 15 years since you quit.  Yearly screening should stop if you develop a health problem that would prevent you from having lung cancer treatment.  Breast Cancer  Practice breast self-awareness. This means understanding how your breasts normally appear and feel.  It also means doing regular breast self-exams. Let your health care provider know about any changes, no matter how small.  If you are in your 20s or 30s, you should have a clinical breast exam (CBE) by a health care provider every 1-3 years as part of a  regular health exam.  If you are 40 or older, have a CBE every year. Also consider having a breast X-ray (mammogram) every year.  If you have a family history of breast cancer, talk to your health care provider about genetic screening.  If you are at high risk for breast cancer, talk to your health care provider about having an MRI and a mammogram every year.  Breast cancer gene (BRCA) assessment is recommended for women who have family members with BRCA-related cancers. BRCA-related cancers  include: ? Breast. ? Ovarian. ? Tubal. ? Peritoneal cancers.  Results of the assessment will determine the need for genetic counseling and BRCA1 and BRCA2 testing.  Cervical Cancer Your health care provider may recommend that you be screened regularly for cancer of the pelvic organs (ovaries, uterus, and vagina). This screening involves a pelvic examination, including checking for microscopic changes to the surface of your cervix (Pap test). You may be encouraged to have this screening done every 3 years, beginning at age 74.  For women ages 34-65, health care providers may recommend pelvic exams and Pap testing every 3 years, or they may recommend the Pap and pelvic exam, combined with testing for human papilloma virus (HPV), every 5 years. Some types of HPV increase your risk of cervical cancer. Testing for HPV may also be done on women of any age with unclear Pap test results.  Other health care providers may not recommend any screening for nonpregnant women who are considered low risk for pelvic cancer and who do not have symptoms. Ask your health care provider if a screening pelvic exam is right for you.  If you have had past treatment for cervical cancer or a condition that could lead to cancer, you need Pap tests and screening for cancer for at least 20 years after your treatment. If Pap tests have been discontinued, your risk factors (such as having a new sexual partner) need to be reassessed to determine if screening should resume. Some women have medical problems that increase the chance of getting cervical cancer. In these cases, your health care provider may recommend more frequent screening and Pap tests.  Colorectal Cancer  This type of cancer can be detected and often prevented.  Routine colorectal cancer screening usually begins at 50 years of age and continues through 50 years of age.  Your health care provider may recommend screening at an earlier age if you have risk factors  for colon cancer.  Your health care provider may also recommend using home test kits to check for hidden blood in the stool.  A small camera at the end of a tube can be used to examine your colon directly (sigmoidoscopy or colonoscopy). This is done to check for the earliest forms of colorectal cancer.  Routine screening usually begins at age 70.  Direct examination of the colon should be repeated every 5-10 years through 50 years of age. However, you may need to be screened more often if early forms of precancerous polyps or small growths are found.  Skin Cancer  Check your skin from head to toe regularly.  Tell your health care provider about any new moles or changes in moles, especially if there is a change in a mole's shape or color.  Also tell your health care provider if you have a mole that is larger than the size of a pencil eraser.  Always use sunscreen. Apply sunscreen liberally and repeatedly throughout the day.  Protect yourself by wearing long sleeves, pants,  a wide-brimmed hat, and sunglasses whenever you are outside.  Heart disease, diabetes, and high blood pressure  High blood pressure causes heart disease and increases the risk of stroke. High blood pressure is more likely to develop in: ? People who have blood pressure in the high end of the normal range (130-139/85-89 mm Hg). ? People who are overweight or obese. ? People who are African American.  If you are 82-70 years of age, have your blood pressure checked every 3-5 years. If you are 56 years of age or older, have your blood pressure checked every year. You should have your blood pressure measured twice-once when you are at a hospital or clinic, and once when you are not at a hospital or clinic. Record the average of the two measurements. To check your blood pressure when you are not at a hospital or clinic, you can use: ? An automated blood pressure machine at a pharmacy. ? A home blood pressure monitor.  If  you are between 62 years and 4 years old, ask your health care provider if you should take aspirin to prevent strokes.  Have regular diabetes screenings. This involves taking a blood sample to check your fasting blood sugar level. ? If you are at a normal weight and have a low risk for diabetes, have this test once every three years after 50 years of age. ? If you are overweight and have a high risk for diabetes, consider being tested at a younger age or more often. Preventing infection Hepatitis B  If you have a higher risk for hepatitis B, you should be screened for this virus. You are considered at high risk for hepatitis B if: ? You were born in a country where hepatitis B is common. Ask your health care provider which countries are considered high risk. ? Your parents were born in a high-risk country, and you have not been immunized against hepatitis B (hepatitis B vaccine). ? You have HIV or AIDS. ? You use needles to inject street drugs. ? You live with someone who has hepatitis B. ? You have had sex with someone who has hepatitis B. ? You get hemodialysis treatment. ? You take certain medicines for conditions, including cancer, organ transplantation, and autoimmune conditions.  Hepatitis C  Blood testing is recommended for: ? Everyone born from [birth year] through 1963. ? Anyone with known risk factors for hepatitis C.  Sexually transmitted infections (STIs)  You should be screened for sexually transmitted infections (STIs) including gonorrhea and chlamydia if: ? You are sexually active and are younger than 50 years of age. ? You are older than 50 years of age and your health care provider tells you that you are at risk for this type of infection. ? Your sexual activity has changed since you were last screened and you are at an increased risk for chlamydia or gonorrhea. Ask your health care provider if you are at risk.  If you do not have HIV, but are at risk, it may be recommended  that you take a prescription medicine daily to prevent HIV infection. This is called pre-exposure prophylaxis (PrEP). You are considered at risk if: ? You are sexually active and do not regularly use condoms or know the HIV status of your partner(s). ? You take drugs by injection. ? You are sexually active with a partner who has HIV.  Talk with your health care provider about whether you are at high risk of being infected with HIV. If you choose  to begin PrEP, you should first be tested for HIV. You should then be tested every 3 months for as long as you are taking PrEP. Pregnancy  If you are premenopausal and you may become pregnant, ask your health care provider about preconception counseling.  If you may become pregnant, take 400 to 800 micrograms (mcg) of folic acid every day.  If you want to prevent pregnancy, talk to your health care provider about birth control (contraception). Osteoporosis and menopause  Osteoporosis is a disease in which the bones lose minerals and strength with aging. This can result in serious bone fractures. Your risk for osteoporosis can be identified using a bone density scan.  If you are 44 years of age or older, or if you are at risk for osteoporosis and fractures, ask your health care provider if you should be screened.  Ask your health care provider whether you should take a calcium or vitamin D supplement to lower your risk for osteoporosis.  Menopause may have certain physical symptoms and risks.  Hormone replacement therapy may reduce some of these symptoms and risks. Talk to your health care provider about whether hormone replacement therapy is right for you. Follow these instructions at home:  Schedule regular health, dental, and eye exams.  Stay current with your immunizations.  Do not use any tobacco products including cigarettes, chewing tobacco, or electronic cigarettes.  If you are pregnant, do not drink alcohol.  If you are  breastfeeding, limit how much and how often you drink alcohol.  Limit alcohol intake to no more than 1 drink per day for nonpregnant women. One drink equals 12 ounces of beer, 5 ounces of wine, or 1 ounces of hard liquor.  Do not use street drugs.  Do not share needles.  Ask your health care provider for help if you need support or information about quitting drugs.  Tell your health care provider if you often feel depressed.  Tell your health care provider if you have ever been abused or do not feel safe at home. This information is not intended to replace advice given to you by your health care provider. Make sure you discuss any questions you have with your health care provider. Document Released: 09/23/2010 Document Revised: 08/16/2015 Document Reviewed: 12/12/2014 Elsevier Interactive Patient Education  [employer].

## 2017-12-21 ENCOUNTER — Encounter: Payer: Self-pay | Admitting: Nurse Practitioner

## 2017-12-24 ENCOUNTER — Encounter: Payer: Self-pay | Admitting: Gastroenterology

## 2018-01-14 ENCOUNTER — Encounter: Payer: Self-pay | Admitting: Gastroenterology

## 2018-01-14 ENCOUNTER — Ambulatory Visit (AMBULATORY_SURGERY_CENTER): Payer: Self-pay

## 2018-01-14 VITALS — Ht 68.0 in | Wt 261.6 lb

## 2018-01-14 DIAGNOSIS — Z1211 Encounter for screening for malignant neoplasm of colon: Secondary | ICD-10-CM

## 2018-01-14 NOTE — Progress Notes (Signed)
Per pt, no allergies to soy or egg products.Pt not taking any weight loss meds or using  O2 at home.  Pt refused emmi video. 

## 2018-01-25 LAB — HM PAP SMEAR: HM Pap smear: NEGATIVE

## 2018-01-26 ENCOUNTER — Ambulatory Visit (AMBULATORY_SURGERY_CENTER): Payer: 59 | Admitting: Gastroenterology

## 2018-01-26 ENCOUNTER — Encounter: Payer: Self-pay | Admitting: Gastroenterology

## 2018-01-26 VITALS — BP 111/66 | HR 77 | Temp 97.3°F | Resp 14 | Ht 68.0 in | Wt 261.0 lb

## 2018-01-26 DIAGNOSIS — K621 Rectal polyp: Secondary | ICD-10-CM

## 2018-01-26 DIAGNOSIS — D128 Benign neoplasm of rectum: Secondary | ICD-10-CM

## 2018-01-26 DIAGNOSIS — Z1211 Encounter for screening for malignant neoplasm of colon: Secondary | ICD-10-CM | POA: Diagnosis present

## 2018-01-26 MED ORDER — SODIUM CHLORIDE 0.9 % IV SOLN: 500.0000 mL | Freq: Once | INTRAVENOUS | Status: DC

## 2018-01-26 NOTE — Patient Instructions (Signed)
Handouts: Polyps  YOU HAD AN ENDOSCOPIC PROCEDURE TODAY AT THE [facility]:   Refer to the procedure report that was given to you for any specific questions about what was found during the examination.  If the procedure report does not answer your questions, please call your gastroenterologist to clarify.  If you requested that your care partner not be given the details of your procedure findings, then the procedure report has been included in a sealed envelope for you to review at your convenience later.  YOU SHOULD EXPECT: Some feelings of bloating in the abdomen. Passage of more gas than usual.  Walking can help get rid of the air that was put into your GI tract during the procedure and reduce the bloating. If you had a lower endoscopy (such as a colonoscopy or flexible sigmoidoscopy) you may notice spotting of blood in your stool or on the toilet paper. If you underwent a bowel prep for your procedure, you may not have a normal bowel movement for a few days.  Please Note:  You might notice some irritation and congestion in your nose or some drainage.  This is from the oxygen used during your procedure.  There is no need for concern and it should clear up in a day or so.  SYMPTOMS TO REPORT IMMEDIATELY:   Following lower endoscopy (colonoscopy or flexible sigmoidoscopy):  Excessive amounts of blood in the stool  Significant tenderness or worsening of abdominal pains  Swelling of the abdomen that is new, acute  Fever of 100F or higher  For urgent or emergent issues, a gastroenterologist can be reached at any hour by calling [phone].   DIET:  We do recommend a small meal at first, but then you may proceed to your regular diet.  Drink plenty of fluids but you should avoid alcoholic beverages for 24 hours.  ACTIVITY:  You should plan to take it easy for the rest of today and you should NOT DRIVE or use heavy machinery until tomorrow (because of the sedation medicines used  during the test).    FOLLOW UP: Our staff will call the number listed on your records the next business day following your procedure to check on you and address any questions or concerns that you may have regarding the information given to you following your procedure. If we do not reach you, we will leave a message.  However, if you are feeling well and you are not experiencing any problems, there is no need to return our call.  We will assume that you have returned to your regular daily activities without incident.  If any biopsies were taken you will be contacted by phone or by letter within the next 1-3 weeks.  Please call us at [phone] if you have not heard about the biopsies in 3 weeks.    SIGNATURES/CONFIDENTIALITY: You and/or your care partner have signed paperwork which will be entered into your electronic medical record.  These signatures attest to the fact that that the information above on your After Visit Summary has been reviewed and is understood.  Full responsibility of the confidentiality of this discharge information lies with you and/or your care-partner. 

## 2018-01-26 NOTE — Progress Notes (Signed)
Called to room to assist during endoscopic procedure.  Patient ID and intended procedure confirmed with present staff. Received instructions for my participation in the procedure from the performing physician.  

## 2018-01-26 NOTE — Progress Notes (Signed)
Report given to PACU, vss 

## 2018-01-26 NOTE — Op Note (Signed)
[facility] Patient Name: Gail Barr Procedure Date: 01/26/2018 8:17 AM MRN: [id number] Endoscopist: Thornton Park MD, MD Age: 52 Referring MD:  Date of Birth: 06-15-1965 Gender: Female Account #: [id number] Procedure:                Colonoscopy Indications:              Screening for colorectal malignant neoplasm. There                            is no known family history of colon cancer or                            polyps. The patient has long-standing intermittent                            constipation but does not require medical therapy. Medicines:                See the Anesthesia note for documentation of the                            administered medications Procedure:                Pre-Anesthesia Assessment:                           - Prior to the procedure, a History and Physical                            was performed, and patient medications and                            allergies were reviewed. The patient's tolerance of                            previous anesthesia was also reviewed. The risks                            and benefits of the procedure and the sedation                            options and risks were discussed with the patient.                            All questions were answered, and informed consent                            was obtained. Prior Anticoagulants: The patient has                            taken no previous anticoagulant or antiplatelet                            agents. ASA Grade Assessment: III - A patient with  severe systemic disease. After reviewing the risks                            and benefits, the patient was deemed in                            satisfactory condition to undergo the procedure.                           After obtaining informed consent, the colonoscope                            was passed under direct vision. Throughout the                            procedure,  the patient's blood pressure, pulse, and                            oxygen saturations were monitored continuously. The                            Colonoscope was introduced through the anus and                            advanced to the the terminal ileum, with                            identification of the appendiceal orifice and IC                            valve. The colonoscopy was performed without                            difficulty. The patient tolerated the procedure                            well. The quality of the bowel preparation was good. Scope In: 8:30:04 AM Scope Out: 8:44:09 AM Scope Withdrawal Time: 0 hours 10 minutes 38 seconds  Total Procedure Duration: 0 hours 14 minutes 5 seconds  Findings:                 Non-bleeding internal hemorrhoids were found during                            endoscopy.                           Two sessile polyps were found in the rectum. The                            polyps were 1 to 2 mm in size. These polyps were                            removed with a cold biopsy forceps. Resection and  retrieval were complete.                           The exam was otherwise without abnormality on                            direct and retroflexion views. Complications:            No immediate complications. Estimated Blood Loss:     Estimated blood loss: none. Impression:               - Non-bleeding internal hemorrhoids.                           - Two 1 to 2 mm polyps in the rectum, removed with                            a cold biopsy forceps. Resected and retrieved.                           - The examination was otherwise normal on direct                            and retroflexion views. Recommendation:           - Discharge patient to home.                           - Resume regular diet.                           - Continue present medications.                           - Await pathology results.                            - Repeat colonoscopy in 5 years for surveillance if                            at least one polyp is adenomatous. If both polyps                            are benign, will recommend continuing with routine                            colorectal cancer screening. Thornton Park MD, MD 01/26/2018 8:49:35 AM This report has been signed electronically.

## 2018-01-27 ENCOUNTER — Telehealth: Payer: Self-pay | Admitting: *Deleted

## 2018-01-27 LAB — HM MAMMOGRAPHY

## 2018-01-27 NOTE — Telephone Encounter (Signed)
  Follow up Call-  Call back number 01/26/2018  Post procedure Call Back phone  # [phone]  Permission to leave phone message Yes  Some recent data might be hidden    LMOM; told pt to call back if she has any questions or concerns

## 2018-01-27 NOTE — Telephone Encounter (Signed)
  Follow up Call-  Call back number 01/26/2018  Post procedure Call Back phone  # [phone]  Permission to leave phone message Yes  Some recent data might be hidden    [facility]

## 2018-02-03 ENCOUNTER — Encounter: Payer: Self-pay | Admitting: Gastroenterology

## 2018-02-22 ENCOUNTER — Encounter: Payer: Self-pay | Admitting: Nurse Practitioner

## 2018-02-26 ENCOUNTER — Ambulatory Visit (INDEPENDENT_AMBULATORY_CARE_PROVIDER_SITE_OTHER): Payer: 59 | Admitting: Endocrinology

## 2018-02-26 ENCOUNTER — Encounter: Payer: Self-pay | Admitting: Endocrinology

## 2018-02-26 VITALS — BP 122/70 | HR 67 | Ht 68.0 in | Wt 264.2 lb

## 2018-02-26 DIAGNOSIS — E7800 Pure hypercholesterolemia, unspecified: Secondary | ICD-10-CM | POA: Diagnosis not present

## 2018-02-26 DIAGNOSIS — E782 Mixed hyperlipidemia: Secondary | ICD-10-CM | POA: Diagnosis not present

## 2018-02-26 DIAGNOSIS — E1165 Type 2 diabetes mellitus with hyperglycemia: Secondary | ICD-10-CM | POA: Diagnosis not present

## 2018-02-26 DIAGNOSIS — E039 Hypothyroidism, unspecified: Secondary | ICD-10-CM | POA: Diagnosis not present

## 2018-02-26 LAB — COMPREHENSIVE METABOLIC PANEL
ALT: 9 U/L (ref 0–35)
AST: 13 U/L (ref 0–37)
Albumin: 4.3 g/dL (ref 3.5–5.2)
Alkaline Phosphatase: 46 U/L (ref 39–117)
BUN: 15 mg/dL (ref 6–23)
CO2: 27 mEq/L (ref 19–32)
Calcium: 9.5 mg/dL (ref 8.4–10.5)
Chloride: 108 mEq/L (ref 96–112)
Creatinine, Ser: 1.04 mg/dL (ref 0.40–1.20)
GFR: 72 mL/min (ref >60.00)
Glucose, Bld: 134 mg/dL — ABNORMAL HIGH (ref 70–99)
Potassium: 4.6 mEq/L (ref 3.5–5.1)
Sodium: 141 mEq/L (ref 135–145)
Total Bilirubin: 0.4 mg/dL (ref 0.2–1.2)
Total Protein: 7.9 g/dL (ref 6.0–8.3)

## 2018-02-26 LAB — LIPID PANEL
Cholesterol: 181 mg/dL (ref 0–200)
HDL: 37.1 mg/dL — ABNORMAL LOW (ref >39.00)
LDL Cholesterol: 125 mg/dL — ABNORMAL HIGH (ref 0–99)
NonHDL: 143.48
Total CHOL/HDL Ratio: 5
Triglycerides: 93 mg/dL (ref 0.0–149.0)
VLDL: 18.6 mg/dL (ref 0.0–40.0)

## 2018-02-26 LAB — POCT GLYCOSYLATED HEMOGLOBIN (HGB A1C): Hemoglobin A1C: 5.9 % — AB (ref 4.0–5.6)

## 2018-02-26 LAB — T4, FREE: Free T4: 0.47 ng/dL — ABNORMAL LOW (ref 0.60–1.60)

## 2018-02-26 LAB — TSH: TSH: 12.17 u[IU]/mL — AB (ref 0.35–4.50)

## 2018-02-26 NOTE — Progress Notes (Addendum)
Patient ID: Gail Barr, female   DOB: 1965/12/04, 52 y.o.   MRN: [id number]          Reason for Appointment: Endocrinology follow-up  Referring physician: Wilfred Lacy   History of Present Illness:          Date of diagnosis of type 2 diabetes mellitus:  2015    Background history:   She had an upper normal A1c of 6.5 in 2014 and then subsequently A1c was in the diabetic range Appears that her blood sugars have been poorly controlled with progressive rise in A1c Follow-up for diabetes has been infrequent and she has not had a consistent primary care provider also She had been treated with metformin initially Subsequently had been on Farxiga in 2016 and also Invokamet subsequently  In 2/19 she had marked hyperglycemia at the emergency room when seen for headache and blurred vision with glucose of 539.  Recent history:        Non-insulin hypoglycemic drugs the patient is taking are: Ozempic 0.25 mg weekly  A1c last was 9.4 done in March 2019 and is now 5.9  Current management, blood sugar patterns and problems identified:  She has not been taking her Levemir insulin more recently  She was told to try increasing her Ozempic halfway between 0.25 and 0.50 but she thinks it caused her nausea  On her own she has tried to improve her diet overall with cutting back on portions, fat intake and sweets and adding more vegetable juices to her regimen  She did not see the dietitian as requested also  She has lost about 6 pounds seen in follow-up since 5/19  This is despite not exercising as much  Although her blood sugars are still as high as 174 at home her A1c looks markedly better  She does try to do some readings at various times of the day        Side effects from medications have been: Diarrhea, abdominal pain from metformin  Compliance with the medical regimen: Better   Glucose monitoring:  done 1-2 times a day         Glucometer: One Touch.       Blood Glucose  readings by monitor download:  PRE-MEAL Fasting Lunch Dinner Bedtime Overall  Glucose range:  111-158    102-172  90-174  Mean/median:  134  117  121  142  127   Previous average blood sugar at home was 141 with more variability  Self-care: The diet that the patient has been following is: tries to limit high-fat foods.      Typical meal intake: Breakfast is eggs with spinach Lunch usually [place] salad and pita bread Dinner is rice or pasta dishes or a small snack               Dietician visit, most recent: None               Exercise: 3 days a week, going to the gym for 45-60 minutes with various exercising and including aerobic  Weight history:  Wt Readings from Last 3 Encounters:  02/26/18 264 lb 3.2 oz (119.8 kg)  01/26/18 261 lb (118.4 kg)  01/14/18 261 lb 9.6 oz (118.7 kg)    Glycemic control:   Lab Results  Component Value Date   HGBA1C 5.9 (A) 02/26/2018   HGBA1C 9.4% 06/17/2017   HGBA1C 11.2 (H) 04/24/2017   Lab Results  Component Value Date   MICROALBUR 41.5 (H) 04/24/2017  [place] 133 (H) 08/20/2017   CREATININE 1.08 07/09/2017   Lab Results  Component Value Date   MICRALBCREAT 69.7 (H) 04/24/2017    Lab Results  Component Value Date   FRUCTOSAMINE 277 07/09/2017     Problem 2:  HYPOTHYROIDISM  She was diagnosed to have hypothyroidism soon after her pregnancy around the year 1995 At that time she had a swelling in her thyroid and no other significant symptoms She was started on thyroid supplements and has been on these and variable doses over the last several years More recently she has been taking mostly 200 mcg She thinks that when her thyroid levels are lower when she is not taking her levothyroxine regularly she only has some difficulty with remembering things and some thinning of her hair but usually no cold intolerance or unusual fatigue She also tends to have more constipation  Although she was initially started on 200 mcg on 04/24/2017  because of her TSH being higher 2 weeks later another 50 mcg was added  Despite her TSH being around 45 earlier this year she was not feeling any unusual fatigue She was told to take a total of 300 mcg of levothyroxine She thinks she is doing this with the separate prescriptions for 200 and 50 mcg tablets separately  She is still trying to take her levothyroxine with water in the morning before eating Again she does not feel any unusual fatigue or cold intolerance  GOITER: She has also had swelling in her thyroid for several years and her ultrasound shows only heterogenous tissue and no nodules   Lab Results  Component Value Date   TSH 45.85 (H) 08/20/2017   TSH 44.91 (H) 07/09/2017   TSH 71.38 (H) 05/06/2017   FREET4 0.22 (L) 08/20/2017   FREET4 0.4 (L) 07/09/2017   FREET4 0.14 (L) 04/24/2017       Allergies as of 02/26/2018      Reactions   Betadine [povidone Iodine]    Sob, swelling   Ibuprofen Shortness Of Breath, Swelling, Anaphylaxis   And chest pain.  Tolerates naproxen without problems   Iodinated Diagnostic Agents Anaphylaxis   Iodine Anaphylaxis   Iohexol     Desc: Pt states [place] and experienced severe sob and swelling. We did her scan w/o iv contrast today.        Thanks., Onset Date: [id number]      Medication List        Accurate as of 02/26/18 10:37 AM. Always use your most recent med list.          glucose blood test strip Check blood glucose 4 time daily (before meals and at bed time). E11.29. One touch Veiro meter.   Lancet Device Misc Check blood glucose 4 time daily (before meals and at bed time). E11.29. One touch [place] meter.   MULTIVITAL PO Take by mouth as needed.   Pen Needles 31G X 6 MM Misc 1 application by Does not apply route at bedtime.   B-D UF III MINI PEN NEEDLES 31G X 5 MM Misc Generic drug:  Insulin Pen Needle USE AS DIRECTED AT BEDTIME   Semaglutide(0.25 or 0.5MG /DOS) 2 MG/1.5ML  Sopn Inject 0.5 mg into the skin once a week.   SYNTHROID 50 MCG tablet Generic drug:  levothyroxine Take 2 tablets (100 mcg total) by mouth daily before breakfast.       Allergies:  Allergies  Allergen Reactions  . Betadine [Povidone  Iodine]     Sob, swelling  . Ibuprofen Shortness Of Breath, Swelling and Anaphylaxis    And chest pain.  Tolerates naproxen without problems  . Iodinated Diagnostic Agents Anaphylaxis  . Iodine Anaphylaxis  . Iohexol      Desc: Pt states she had a CT [place] approx 5 yrs ago and experienced severe sob and swelling. We did her scan w/o iv contrast today.        Thanks., Onset Date: [id number]     Past Medical History:  Diagnosis Date  . Abnormal Pap smear early 2000's   had colposcopy, no additional treatment. normal paps since  . Chronic kidney disease 2018   one kidney  . Diabetes mellitus without complication ([place])   . Glaucoma    no eye drops  . History of chickenpox   . Hypothyroidism 1993  . Multiple thyroid nodules   . Pyelonephritis 1993   when pregnant; subsequently found to have shrunken, nonfunctioning kidney on one side  . Unilateral renal atrophy     Past Surgical History:  Procedure Laterality Date  . DILITATION & CURRETTAGE/HYSTROSCOPY WITH NOVASURE ABLATION N/A 03/02/2013   Procedure: DILATATION & CURETTAGE/HYSTEROSCOPY WITH NOVASURE ABLATION;  Surgeon: Olga Millers, MD;  Location: [facility];  Service: Gynecology;  Laterality: N/A;    Family History  Problem Relation Age of Onset  . Asthma Brother   . Diabetes Brother   . Diabetes Maternal Aunt   . Diabetes Maternal Uncle   . Diabetes Maternal Grandmother   . Cancer Maternal Grandfather        prostate cancer  . Colon cancer Neg Hx   . Rectal cancer Neg Hx   . Stomach cancer Neg Hx     Social History:  reports that she has never smoked. She has never used smokeless tobacco. She reports that she drinks alcohol. She reports that she does not use  drugs.   Review of Systems   Lipid history: She has not been on any statin drugs   Lab Results  Component Value Date   CHOL 197 08/20/2017   HDL 38.80 (L) 08/20/2017   LDLCALC 133 (H) 08/20/2017   TRIG 125.0 08/20/2017   CHOLHDL 5 08/20/2017           Hypertension: none, had been on ramipril previously  BP Readings from Last 3 Encounters:  02/26/18 122/70  01/26/18 111/66  12/18/17 126/84    Most recent eye exam was in 3/19  Most recent foot exam:4/19    LABS:  Office Visit on 02/26/2018  Component Date Value Ref Range Status  . Hemoglobin A1C 02/26/2018 5.9* 4.0 - 5.6 % Final  Documentation on 02/22/2018  Component Date Value Ref Range Status  . HM Pap smear 01/25/2018 ASC-US negative HPV   Final   Atypical Squamous Cells of undetermined significance neg Hpv    Physical Examination:  BP 122/70 (BP Location: Left Arm, Patient Position: Sitting, Cuff Size: Normal)   Pulse 67   Ht 5\' 8"  (1.727 m)   Wt 264 lb 3.2 oz (119.8 kg)   SpO2 98%   BMI 40.17 kg/m       ASSESSMENT:  Diabetes type 2, current BMI 41  See history of present illness for detailed discussion of current diabetes management, blood sugar patterns and problems identified  Baseline A1c is 9.4 and now 5.9  With continued Ozempic and especially with her try to improve her diet her blood sugars are progressively better and averaging about  130 at home including after meals  She can however do better with more frequent exercise  HYPOTHYROIDISM: She is likely has Hashimoto's thyroiditis with a goiter, duration 25 years  She has had minimal symptoms despite persistently high TSH levels in the past With taking 300 mcg recently she is feeling fairly good However has not had follow-up labs done   LIPIDS: She has had hypercholesterolemia not treated and with her age of 53 discussed need for cardiovascular protection  PLAN for diabetes:    Continue Ozempic alone  Discussed blood sugar  targets both fasting and after meals  Consistent exercise  Check thyroid levels  Recheck lipid panel and consider statin drug of LDL over 100  Recommend formal consultation with dietitian to have a proper meal plan  There are no Patient Instructions on file for this visit.      Elayne Snare 02/26/2018, 10:37 AM   Note: This office note was prepared with Dragon voice recognition system technology. Any transcriptional errors that result from this process are unintentional.

## 2018-03-02 ENCOUNTER — Other Ambulatory Visit: Payer: Self-pay

## 2018-03-02 MED ORDER — LEVOTHYROXINE SODIUM 300 MCG PO TABS: 300.0000 ug | ORAL_TABLET | Freq: Every day | ORAL | 3 refills | Status: DC

## 2018-03-02 MED ORDER — PRAVASTATIN SODIUM 20 MG PO TABS: 20.0000 mg | ORAL_TABLET | Freq: Every day | ORAL | 3 refills | Status: DC

## 2018-03-23 ENCOUNTER — Ambulatory Visit: Payer: 59 | Admitting: Dietician

## 2018-03-26 ENCOUNTER — Encounter: Payer: Self-pay | Admitting: Dietician

## 2018-03-26 ENCOUNTER — Encounter: Payer: BLUE CROSS/BLUE SHIELD | Attending: Endocrinology | Admitting: Dietician

## 2018-03-26 DIAGNOSIS — E1129 Type 2 diabetes mellitus with other diabetic kidney complication: Secondary | ICD-10-CM | POA: Insufficient documentation

## 2018-03-26 DIAGNOSIS — IMO0002 Reserved for concepts with insufficient information to code with codable children: Secondary | ICD-10-CM

## 2018-03-26 DIAGNOSIS — E1165 Type 2 diabetes mellitus with hyperglycemia: Secondary | ICD-10-CM | POA: Insufficient documentation

## 2018-03-26 NOTE — Patient Instructions (Addendum)
Recommend supplementing with Vitamin D- 2,000-5,000 units daily. Stay active.  Aim for 30 minutes most days. Continue the healthy lifestyle changes that you have made!  Continue to be mindful See the plant based resource sheet  Plant Strong Podcast  The Game [place] Movie recipes (google) Be mindful about your fat intake.

## 2018-03-26 NOTE — Progress Notes (Signed)
Diabetes Self-Management Education  Visit Type: First/Initial  Appt. Start Time: 1035 Appt. End Time: 1150  03/26/2018  [name], identified by name and date of birth, is a 53 y.o. female with a diagnosis of Diabetes: Type 2.   ASSESSMENT History includes Type 2 diabetes since 2015, hypothyroidism, HLD, vitamin D deficiency (8 in 207), 1 kidney, Labs include:  Cholesterol 181, HDL 37, LDL 125, Triglycerides 93, AC 5.9%, GFR 72 on 12/6/9.  Weight hx: 271 lbs increased from 264 lbs 04/29/17 259 lbs summer 2019 165 lbs 14 years ago when she moved here from [place] 156 lbs lowest adult weight 297 highest adult weight Her initial weight los goal is to lose 50 lbs and ultimate goal is to return to 160 lbs.  Currently there is a competition at work for the number of steps.  She has a fit bit and is working on being more active.  She will go to  Exercise classes a couple of times per week at the university. She is using herbs in tea and smoothie such as bitter melon, sour sap leaf, and lime leaf. Currently she states that she is doing a "liver cleans" and is eating no meat but is eating fish and will continue this for 3-6 months. She stopped using the Ozempic   Patient lives with her husband.  They share shopping and cooking.  Her husband teaches [employer]i [employer] and [employer]se and cooks healthy when she wishes.  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  She was previously a Psychologist, sport and exercise. She moved from [place] 14 years and gained from 165 lbs since that time.  She is originally from the [place].    Height 5\' 8"  (1.727 m), weight 271 lb (122.9 kg). Body mass index is 41.21 kg/m.  Diabetes Self-Management Education - 03/26/18 1102      Visit Information   Visit Type  First/Initial      Initial Visit   Diabetes Type  Type 2    Are you currently following a meal plan?  Yes    What type of meal plan do you follow?  currently little meat and  doing a "cleanse"    Are you taking your medications as prescribed?  No    Date Diagnosed  2015      Health Coping   How would you rate your overall health?  Excellent      Psychosocial Assessment   Patient Belief/Attitude about Diabetes  Motivated to manage diabetes    Self-care barriers  None    Self-management support  Doctor's office;Family    Other persons present  Patient    Patient Concerns  Nutrition/Meal planning;Glycemic Control;Weight Control    Special Needs  None    Preferred Learning Style  No preference indicated    Learning Readiness  Ready    How often do you need to have someone help you when you read instructions, pamphlets, or other written materials from your doctor or pharmacy?  1 - Never    What is the last grade level you completed in school?  2 years college and currently in college      Pre-Education Assessment   Patient understands the diabetes disease and treatment process.  Needs Review    Patient understands incorporating nutritional management into lifestyle.  Needs Review    Patient undertands incorporating physical activity into lifestyle.  Needs Review    Patient understands using medications safely.  Needs  Review    Patient understands monitoring blood glucose, interpreting and using results  Needs Review    Patient understands prevention, detection, and treatment of acute complications.  Needs Review    Patient understands prevention, detection, and treatment of chronic complications.  Needs Review    Patient understands how to develop strategies to address psychosocial issues.  Needs Review    Patient understands how to develop strategies to promote health/change behavior.  Needs Review      Complications   Last HgB A1C per patient/outside source  5.9 %    How often do you check your blood sugar?  1-2 times/day    Fasting Blood glucose range (mg/dL)  70-129    Postprandial Blood glucose range (mg/dL)  130-179    Number of hypoglycemic episodes  per month  1    Have you had a dilated eye exam in the past 12 months?  Yes    Have you had a dental exam in the past 12 months?  Yes    Are you checking your feet?  Yes   has a nail infection   How many days per week are you checking your feet?  7      Dietary Intake   Breakfast  regular oatmeal, blueberries OR cornflakes and home dried cranberries without sugar, almond milk OR yogurt, fruit OR boiled eggs and canteloupe    Snack (morning)  none    Lunch  greek salad with tuna OR homemade vegetable soup, banana OR out to eat     Snack (afternoon)  tangerine, rare cake    Dinner  crackers, cheese OR quinoa or rice, beans, vegetables, egg plant OR tofurky, black beans, cabbage OR fish and casava    Beverage(s)  herbal teas, bitter melon tea, unsweetened tea, water, ginger tea, almond milk      Exercise   Exercise Type  Light (walking / raking leaves)      Patient Education   Previous Diabetes Education  Yes (please comment)   2015   Nutrition management   Information on hints to eating out and maintain blood glucose control.;Carbohydrate counting    Physical activity and exercise   Role of exercise on diabetes management, blood pressure control and cardiac health.    Monitoring  Purpose and frequency of SMBG.;Daily foot exams;Identified appropriate SMBG and/or A1C goals.    Psychosocial adjustment  Worked with patient to identify barriers to care and solutions    Personal strategies to promote health  Lifestyle issues that need to be addressed for better diabetes care      Individualized Goals (developed by patient)   Nutrition  Follow meal plan discussed    Physical Activity  Exercise 5-7 days per week    Monitoring   test my blood glucose as discussed    Reducing Risk  examine blood glucose patterns      Post-Education Assessment   Patient understands the diabetes disease and treatment process.  Demonstrates understanding / competency    Patient understands incorporating  nutritional management into lifestyle.  Demonstrates understanding / competency    Patient undertands incorporating physical activity into lifestyle.  Demonstrates understanding / competency    Patient understands using medications safely.  Demonstrates understanding / competency    Patient understands monitoring blood glucose, interpreting and using results  Demonstrates understanding / competency    Patient understands prevention, detection, and treatment of acute complications.  Demonstrates understanding / competency    Patient understands prevention, detection, and treatment of  chronic complications.  Demonstrates understanding / competency    Patient understands how to develop strategies to address psychosocial issues.  Demonstrates understanding / competency    Patient understands how to develop strategies to promote health/change behavior.  Needs Review      Outcomes   Expected Outcomes  Demonstrated interest in learning. Expect positive outcomes    Future DMSE  3-4 months    Program Status  Completed       Individualized Plan for Diabetes Self-Management Training:   Learning Objective:  Patient will have a greater understanding of diabetes self-management. Patient education plan is to attend individual and/or group sessions per assessed needs and concerns.   Plan:   Patient Instructions  Recommend supplementing with Vitamin D- 2,000-5,000 units daily. Stay active.  Aim for 30 minutes most days. Continue the healthy lifestyle changes that you have made!  Continue to be mindful See the plant based resource sheet  Plant Strong Podcast  The Game [place] Movie recipes (google) Be mindful about your fat intake.      Expected Outcomes:  Demonstrated interest in learning. Expect positive outcomes  Education material provided: Meal plan card  If problems or questions, patient to contact team via:  Phone  Future DSME appointment: 3-4 months

## 2018-05-09 ENCOUNTER — Other Ambulatory Visit: Payer: Self-pay | Admitting: Endocrinology

## 2018-05-09 DIAGNOSIS — E1165 Type 2 diabetes mellitus with hyperglycemia: Secondary | ICD-10-CM

## 2018-05-09 DIAGNOSIS — E039 Hypothyroidism, unspecified: Secondary | ICD-10-CM

## 2018-05-11 ENCOUNTER — Other Ambulatory Visit: Payer: 59

## 2018-05-14 ENCOUNTER — Ambulatory Visit: Payer: 59 | Admitting: Endocrinology

## 2018-05-14 ENCOUNTER — Other Ambulatory Visit (INDEPENDENT_AMBULATORY_CARE_PROVIDER_SITE_OTHER): Payer: BLUE CROSS/BLUE SHIELD

## 2018-05-14 DIAGNOSIS — E1165 Type 2 diabetes mellitus with hyperglycemia: Secondary | ICD-10-CM | POA: Diagnosis not present

## 2018-05-14 DIAGNOSIS — E039 Hypothyroidism, unspecified: Secondary | ICD-10-CM

## 2018-05-14 LAB — TSH: TSH: 8.2 u[IU]/mL — ABNORMAL HIGH (ref 0.35–4.50)

## 2018-05-14 LAB — COMPREHENSIVE METABOLIC PANEL
ALT: 11 U/L (ref 0–35)
AST: 13 U/L (ref 0–37)
Albumin: 4.3 g/dL (ref 3.5–5.2)
Alkaline Phosphatase: 51 U/L (ref 39–117)
BILIRUBIN TOTAL: 0.4 mg/dL (ref 0.2–1.2)
BUN: 11 mg/dL (ref 6–23)
CALCIUM: 9.8 mg/dL (ref 8.4–10.5)
CO2: 26 mEq/L (ref 19–32)
Chloride: 106 mEq/L (ref 96–112)
Creatinine, Ser: 0.89 mg/dL (ref 0.40–1.20)
GFR: 81.02 mL/min (ref >60.00)
Glucose, Bld: 93 mg/dL (ref 70–99)
Potassium: 4.1 mEq/L (ref 3.5–5.1)
Sodium: 139 mEq/L (ref 135–145)
Total Protein: 7.6 g/dL (ref 6.0–8.3)

## 2018-05-14 LAB — LDL CHOLESTEROL, DIRECT: Direct LDL: 102 mg/dL

## 2018-05-14 LAB — LIPID PANEL
CHOLESTEROL: 175 mg/dL (ref 0–200)
HDL: 32.8 mg/dL — ABNORMAL LOW (ref >39.00)
NonHDL: 142.28
Total CHOL/HDL Ratio: 5
Triglycerides: 213 mg/dL — ABNORMAL HIGH (ref 0.0–149.0)
VLDL: 42.6 mg/dL — ABNORMAL HIGH (ref 0.0–40.0)

## 2018-05-14 LAB — T4, FREE: Free T4: 0.54 ng/dL — ABNORMAL LOW (ref 0.60–1.60)

## 2018-05-15 LAB — FRUCTOSAMINE: Fructosamine: 258 umol/L (ref 0–285)

## 2018-05-18 ENCOUNTER — Ambulatory Visit: Payer: Self-pay | Admitting: Endocrinology

## 2018-05-25 ENCOUNTER — Other Ambulatory Visit: Payer: 59

## 2018-05-25 ENCOUNTER — Other Ambulatory Visit: Payer: Self-pay

## 2018-05-25 DIAGNOSIS — E1165 Type 2 diabetes mellitus with hyperglycemia: Secondary | ICD-10-CM

## 2018-05-25 MED ORDER — SEMAGLUTIDE(0.25 OR 0.5MG/DOS) 2 MG/1.5ML ~~LOC~~ SOPN: 0.5000 mg | PEN_INJECTOR | SUBCUTANEOUS | 0 refills | Status: DC

## 2018-05-28 ENCOUNTER — Ambulatory Visit: Payer: 59 | Admitting: Endocrinology

## 2018-05-30 ENCOUNTER — Other Ambulatory Visit: Payer: Self-pay | Admitting: Endocrinology

## 2018-06-09 ENCOUNTER — Other Ambulatory Visit: Payer: Self-pay | Admitting: Endocrinology

## 2018-06-18 ENCOUNTER — Encounter: Payer: Self-pay | Admitting: Nurse Practitioner

## 2018-06-18 ENCOUNTER — Ambulatory Visit (INDEPENDENT_AMBULATORY_CARE_PROVIDER_SITE_OTHER): Payer: BLUE CROSS/BLUE SHIELD | Admitting: Nurse Practitioner

## 2018-06-18 ENCOUNTER — Other Ambulatory Visit: Payer: Self-pay

## 2018-06-18 DIAGNOSIS — N926 Irregular menstruation, unspecified: Secondary | ICD-10-CM | POA: Insufficient documentation

## 2018-06-18 DIAGNOSIS — E1129 Type 2 diabetes mellitus with other diabetic kidney complication: Secondary | ICD-10-CM | POA: Diagnosis not present

## 2018-06-18 DIAGNOSIS — R809 Proteinuria, unspecified: Secondary | ICD-10-CM | POA: Diagnosis not present

## 2018-06-18 NOTE — Progress Notes (Signed)
Abstracted result and sent to scan  

## 2018-06-18 NOTE — Assessment & Plan Note (Signed)
Continue diet recommendations by dietician  Reviewed cardiovascular risk with patient. Keep weekly record of neck, waist, and hip circumference. Return completed PAR-q and SF-36 forms. If abnormal PAR-Q need to refer for stress test prior to engaging in moderate to high intensity exercise. For now Start walking daily 30-6mins.  f/up in 76months.

## 2018-06-18 NOTE — Patient Instructions (Signed)
Please discuss change in insulin with Dr. Dwyane Dee. Continue diet recommendations by dietician  Reviewed cardiovascular risk with patient Return completed PAR-q and SF-36 forms. If abnormal PAR-Q need to refer for stress test prior to engaging in moderate to high intensity exercise. For now Start walking daily 30-59mins.  f/up in 22months.

## 2018-06-18 NOTE — Progress Notes (Signed)
Virtual Visit via Telephone Note  I connected with Gail Barr on 06/18/18 at 10:00 AM EDT by telephone and verified that I am speaking with the correct person using two identifiers.   I discussed the limitations, risks, security and privacy concerns of performing an evaluation and management service by telephone and the availability of in person appointments. I also discussed with the patient that there may be a patient responsible charge related to this service. The patient expressed understanding and agreed to proceed.  History of Present Illness:  Morbid Obesity: BMi of 41.05. Unable to provide waist and hip circumference. She has started seeing nutritionist: Plan is to change to plant based diet Reports fluctuating glucose due to skipping meals then overeating. Intermittent glucose in 200s in last few days. Will like to know if insulin can be resumed? No exercise due to ankle injury.   Observations/Objective: Alert and oriented x4. Normal voice and speech. Wt Readings from Last 3 Encounters:  06/18/18 270 lb (122.5 kg)  03/26/18 271 lb (122.9 kg)  02/26/18 264 lb 3.2 oz (119.8 kg)   BP Readings from Last 3 Encounters:  06/18/18 130/70  02/26/18 122/70  01/26/18 111/66   Assessment and Plan: Gail Barr was seen today for follow-up.  Diagnoses and all orders for this visit:  Morbid obesity ([place])  Type 2 diabetes mellitus with microalbuminuria, without long-term current use of insulin ([facility])   Follow Up Instructions: Please discuss change in insulin with Dr. Dwyane Dee. Continue diet recommendations by dietician  Reviewed cardiovascular risk with patient Return completed PAR-q and SF-36 forms. If abnormal PAR-Q need to refer for stress test prior to engaging in moderate to high intensity exercise. For now Start walking daily 30-20mins.  f/up in 45months.  I discussed the assessment and treatment plan with the patient. The patient was provided an opportunity to ask questions and  all were answered. The patient agreed with the plan and demonstrated an understanding of the instructions.   The patient was advised to call back or seek an in-person evaluation if the symptoms worsen or if the condition fails to improve as anticipated.  I provided 25 minutes of non-face-to-face time during this encounter.   Wilfred Lacy, NP

## 2018-06-20 ENCOUNTER — Other Ambulatory Visit: Payer: Self-pay | Admitting: Endocrinology

## 2018-06-22 ENCOUNTER — Other Ambulatory Visit: Payer: Self-pay | Admitting: Endocrinology

## 2018-06-25 ENCOUNTER — Ambulatory Visit: Payer: 59 | Admitting: Dietician

## 2018-06-25 ENCOUNTER — Telehealth: Payer: Self-pay | Admitting: Dietician

## 2018-06-25 NOTE — Telephone Encounter (Signed)
Called patient and left a message. Patient missed her appointment today. She can call for any questions or concerns.  Antonieta Iba, RD, LDN, CDE

## 2018-07-17 ENCOUNTER — Other Ambulatory Visit: Payer: Self-pay | Admitting: Endocrinology

## 2018-07-22 IMAGING — CT CT HEAD W/O CM
3 series · 15 of 47 positions shown, 18 images · non-contrast
Comparison: CT 06/24/2007, MR 03/08/2013

CLINICAL DATA: 50-year-old female with a history of left arm
numbness

EXAM:
CT HEAD WITHOUT CONTRAST
TECHNIQUE: Contiguous axial images were obtained from the base of the skull
through the vertex without intravenous contrast.

[Series 2: head 5.0 h30s · axial · 0.43mm/px · z∈[-209,-69]mm · 9 of 34 slices shown, 12 images]
[im 3/34  brain]
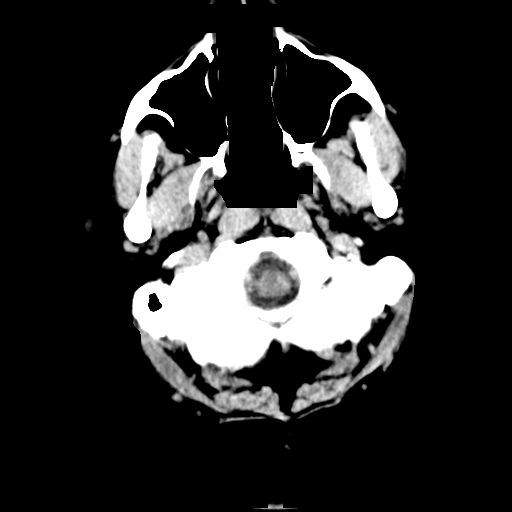
[im 3/34  bone]
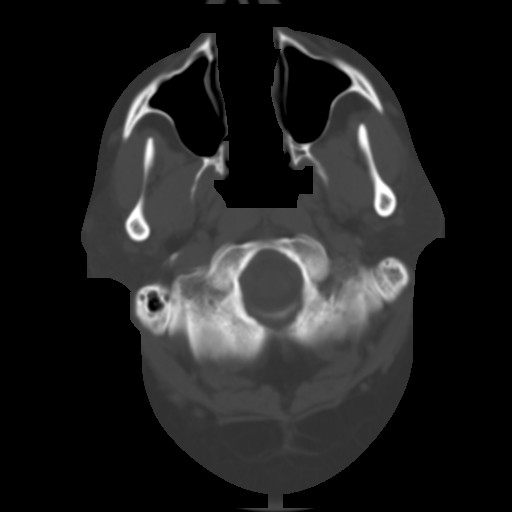
[im 6/34  brain]
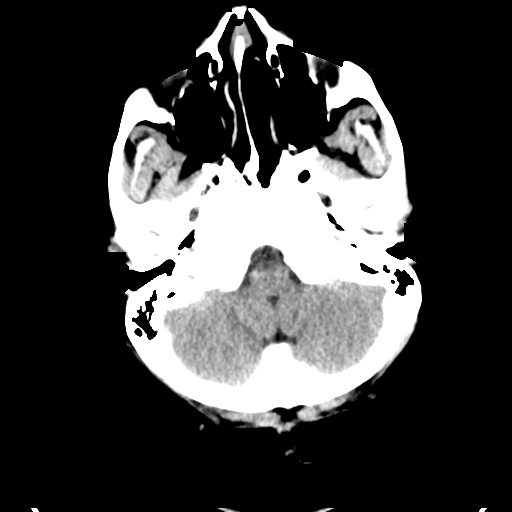
[im 10/34  brain]
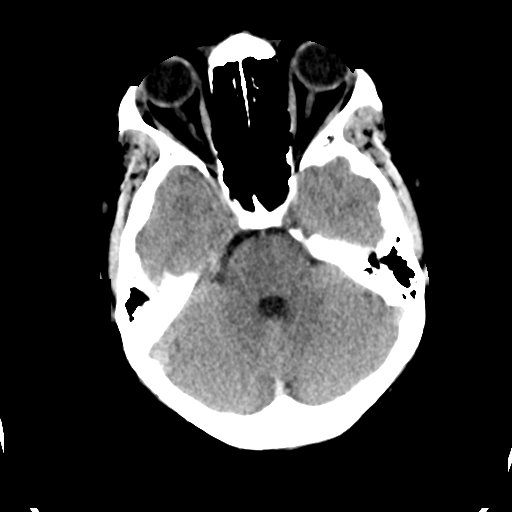
[im 13/34  brain]
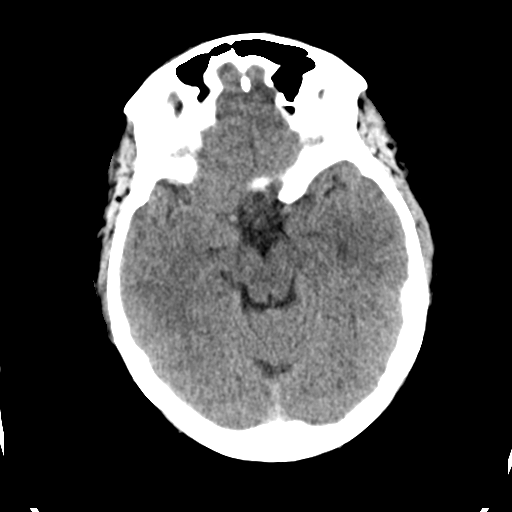
[im 18/34  brain]
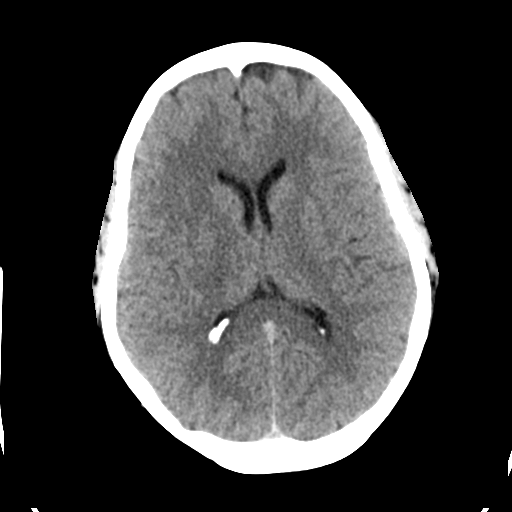
[im 18/34  bone]
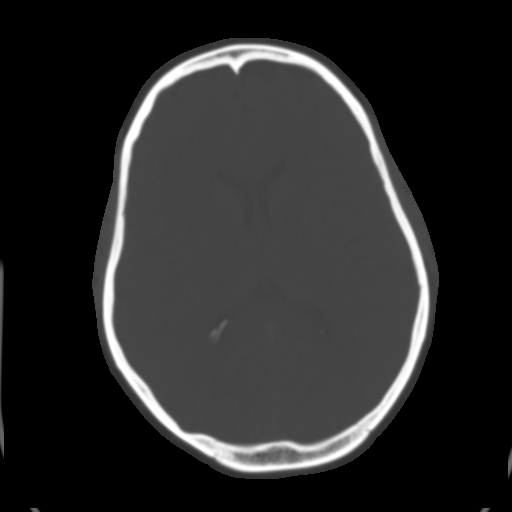
[im 21/34  brain]
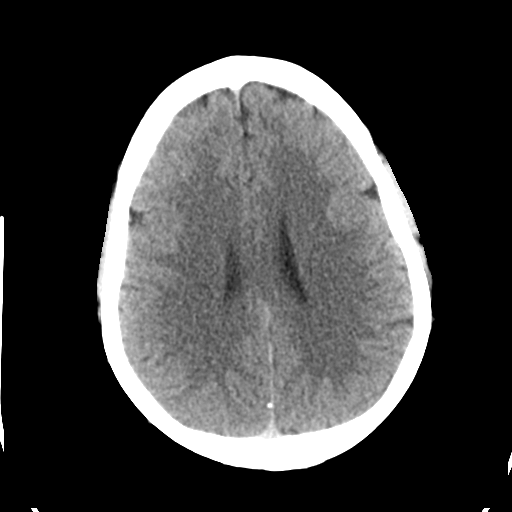
[im 24/34  brain]
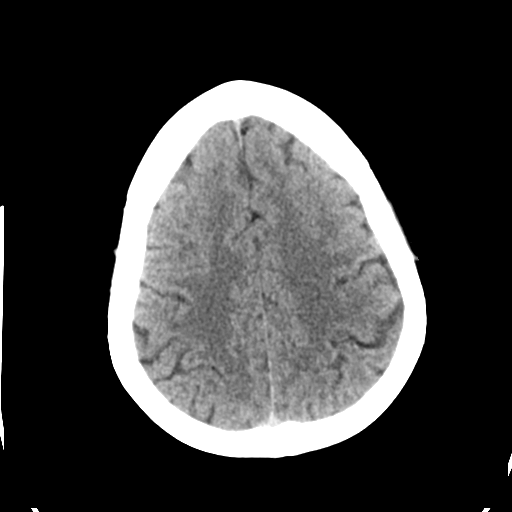
[im 28/34  brain]
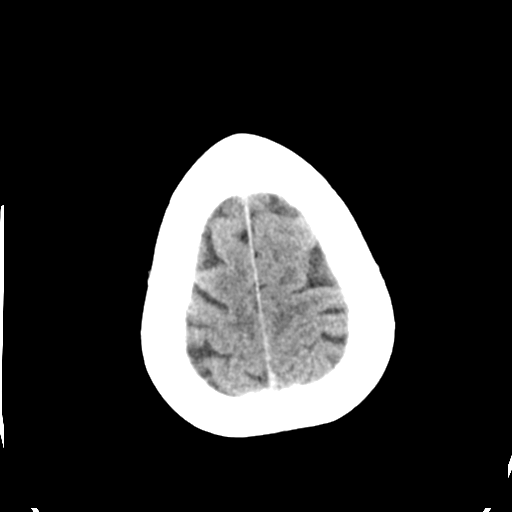
[im 31/34  brain]
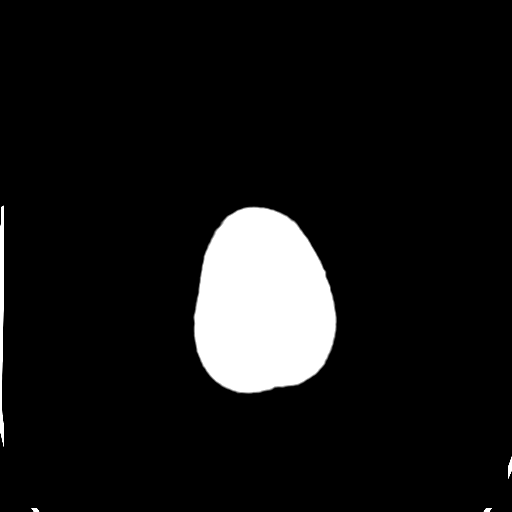
[im 31/34  bone]
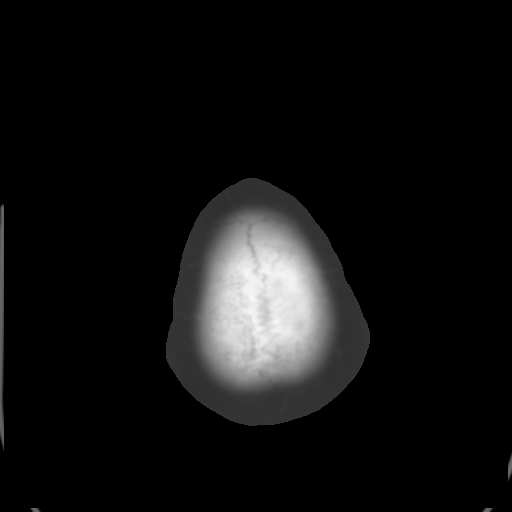

[Series 4: head 3.0 mpr · coronal · 0.32mm/px · 3 of 67 slices shown (1 of 2)]
[im 23/67  brain]
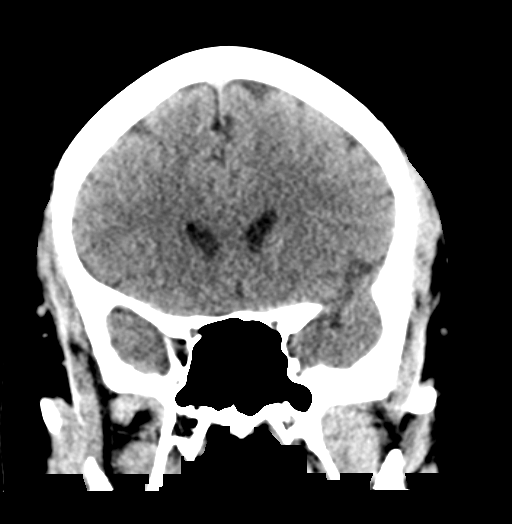
[im 30/67  brain]
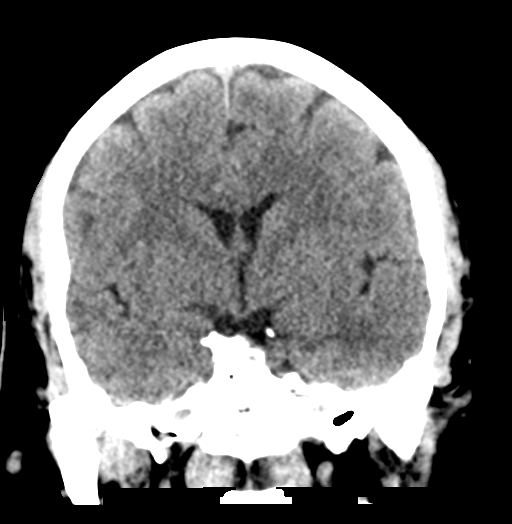
[im 37/67  brain]
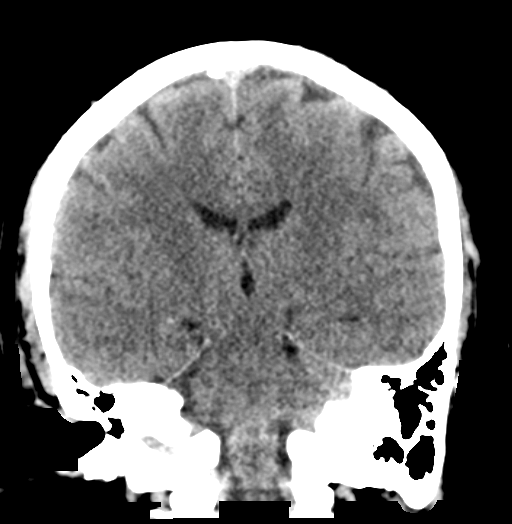

[Series 5: head 3.0 mpr · sagittal · 0.33mm/px · 3 of 61 slices shown (2 of 2)]
[im 21/61  brain]
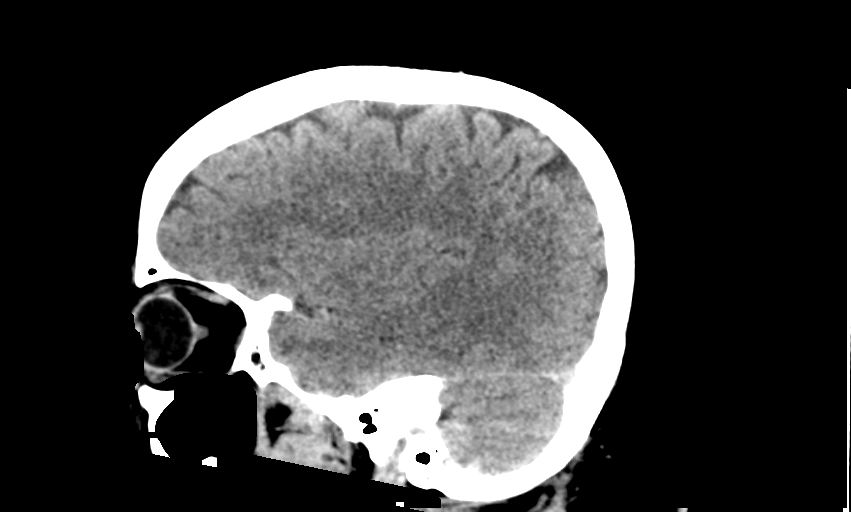
[im 31/61  brain]
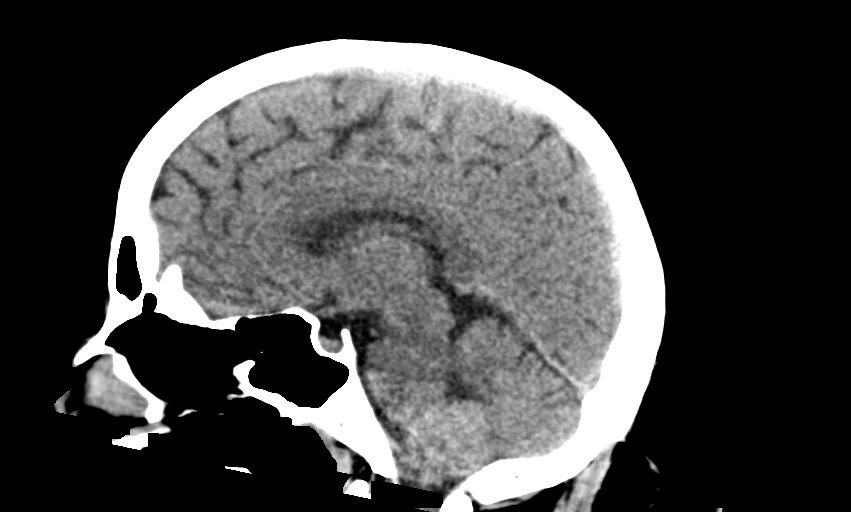
[im 41/61  brain]
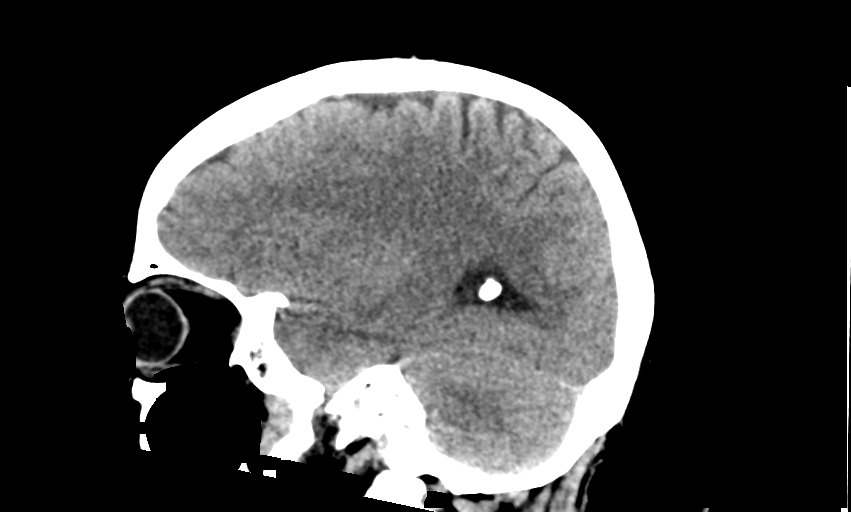

[15 of 47 positions shown; findings below may reference images not displayed]

FINDINGS: Unremarkable appearance of the calvarium without acute fracture or
aggressive lesion.

Unremarkable appearance of the scalp soft tissues.

Unremarkable appearance of the bilateral orbits.

Mastoid air cells are clear.

No significant paranasal sinus disease

No acute intracranial hemorrhage, midline shift, or mass effect.

Gray-white differentiation is maintained, without CT evidence of
acute ischemia.

Unremarkable configuration of the ventricles.
IMPRESSION: No CT evidence of acute intracranial abnormality.

## 2018-07-28 ENCOUNTER — Ambulatory Visit (INDEPENDENT_AMBULATORY_CARE_PROVIDER_SITE_OTHER): Payer: BLUE CROSS/BLUE SHIELD | Admitting: Endocrinology

## 2018-07-28 ENCOUNTER — Encounter: Payer: Self-pay | Admitting: Endocrinology

## 2018-07-28 ENCOUNTER — Other Ambulatory Visit: Payer: Self-pay

## 2018-07-28 VITALS — BP 128/80 | HR 77 | Ht 68.0 in | Wt 266.0 lb

## 2018-07-28 DIAGNOSIS — E039 Hypothyroidism, unspecified: Secondary | ICD-10-CM

## 2018-07-28 DIAGNOSIS — E1165 Type 2 diabetes mellitus with hyperglycemia: Secondary | ICD-10-CM

## 2018-07-28 LAB — POCT GLYCOSYLATED HEMOGLOBIN (HGB A1C): Hemoglobin A1C: 7.4 % — AB (ref 4.0–5.6)

## 2018-07-28 NOTE — Addendum Note (Signed)
Addended by: Nile Riggs on: 07/28/2018 04:10 PM   Modules accepted: Orders

## 2018-07-28 NOTE — Patient Instructions (Signed)
Take the 3 mg Rybelsus supplement in the morning with water, may take with thyroid supplement After 2 weeks if no nausea increase her medication to 2 tablets daily at the same time Before the medication is finished call for prescription for 7 mg  Check blood sugars on waking up days a week  Also check blood sugars about 2 hours after meals and do this after different meals by rotation  Recommended blood sugar levels on waking up are 90-130 and about 2 hours after meal is 130-160  Please bring your blood sugar monitor to each visit, thank you

## 2018-07-28 NOTE — Progress Notes (Signed)
Patient ID: Gail Barr, female   DOB: 05-01-1965, 53 y.o.   MRN: [id number]          Reason for Appointment: Endocrinology follow-up  Referring physician: Wilfred Lacy   History of Present Illness:          Date of diagnosis of type 2 diabetes mellitus:  2015    Background history:   She had an upper normal A1c of 6.5 in 2014 and then subsequently A1c was in the diabetic range Appears that her blood sugars have been poorly controlled with progressive rise in A1c Follow-up for diabetes has been infrequent and she has not had a consistent primary care provider also She had been treated with metformin initially Subsequently had been on Farxiga in 2016 and also Invokamet subsequently  In 2/19 she had marked hyperglycemia at the emergency room when seen for headache and blurred vision with glucose of 539.  Recent history:        Non-insulin hypoglycemic drugs the patient is taking are: Ozempic 0.25 mg weekly  A1c last was 9.4 done in March 2019 and is now 5.9  Current management, blood sugar patterns and problems identified:  She has not been taking Ozempic since earlier this year  Apparently her co-pay had gone up to $600 possibly because of her deductible and she did not continue this  Also she did not keep her appointment in February when her fructosamine was  In March she was reporting higher blood sugars and a prescription for NovoLog was sent in, not clear who prescribed this  Blood sugars are not doing any better  However she is not having symptoms of weakness, increased thirst but does have a little blurred vision  She has seen the dietitian earlier this year  Has lost some weight since her last visit also  Blood sugars are about the same throughout the day and averaging about 180-190  Currently she is doing mostly walking and other physical exercises since she cannot go to the gym  Also generally trying to follow her diet as instructed by dietitian         Side effects from medications have been: Diarrhea, abdominal pain from metformin  Compliance with the medical regimen: Better   Glucose monitoring:  done 1-2 times a day         Glucometer: One Touch.       Blood Glucose readings by monitor download:   PRE-MEAL Fasting Lunch Dinner Bedtime Overall  Glucose range:  150-237    164-261  117-321  Mean/median:  150  170  187  187  185   Previous readings:  PRE-MEAL Fasting Lunch Dinner Bedtime Overall  Glucose range:  111-158    102-172  90-174  Mean/median:  134  117  121  142  127   Previous average blood sugar at home was 141 with more variability  Self-care: The diet that the patient has been following is: tries to limit high-fat foods.      Typical meal intake: Breakfast is eggs with spinach Lunch usually [place] salad and pita bread Dinner is rice or pasta dishes or a small snack               Dietician visit, most recent: 1/20               Exercise: walks 8 week  Weight history:  Wt Readings from Last 3 Encounters:  07/28/18 266 lb (120.7 kg)  06/18/18 270 lb (122.5 kg)  03/26/18  271 lb (122.9 kg)   Glycemic control:   Lab Results  Component Value Date   HGBA1C 5.9 (A) 02/26/2018   HGBA1C 9.4% 06/17/2017   HGBA1C 11.2 (H) 04/24/2017   Lab Results  Component Value Date   MICROALBUR 41.5 (H) 04/24/2017   LDLCALC 125 (H) 02/26/2018   CREATININE 0.89 05/14/2018   Lab Results  Component Value Date   MICRALBCREAT 69.7 (H) 04/24/2017    Lab Results  Component Value Date   FRUCTOSAMINE 258 05/14/2018   FRUCTOSAMINE 277 07/09/2017    Problem 2:  HYPOTHYROIDISM  She was diagnosed to have hypothyroidism soon after her pregnancy around the year 1995 At that time she had a swelling in her thyroid and no other significant symptoms She was started on thyroid supplements and has been on these and variable doses over the last several years More recently she has been taking mostly 200 mcg She thinks that when  her thyroid levels are lower when she is not taking her levothyroxine regularly she only has some difficulty with remembering things and some thinning of her hair but usually no cold intolerance or unusual fatigue She also tends to have more constipation  Although she was initially started on 200 mcg on 04/24/2017 because of her TSH being higher 2 weeks later another 50 mcg was added  TSH was still high when she was taking separate prescriptions for 200 and 50 mcg tablets Since 12/19 she has been taking 300 mcg of levothyroxine  She is feels less tired with higher dose  Has been taking her levothyroxine with water daily in the morning  Her TSH was improved but still high at 8.2 in February, she did not report for an office visit at that time  GOITER: She has also had swelling in her thyroid for several years and her ultrasound shows only heterogenous tissue and no nodules She says that she was having some swelling and tenderness on the right thyroid area a few days ago but this is better now   Lab Results  Component Value Date   TSH 8.20 (H) 05/14/2018   TSH 12.17 (H) 02/26/2018   TSH 45.85 (H) 08/20/2017   FREET4 0.54 (L) 05/14/2018   FREET4 0.47 (L) 02/26/2018   FREET4 0.22 (L) 08/20/2017       Allergies as of 07/28/2018      Reactions   Betadine [povidone Iodine]    Sob, swelling   Ibuprofen Shortness Of Breath, Swelling, Anaphylaxis   And chest pain.  Tolerates naproxen without problems   Iodinated Diagnostic Agents Anaphylaxis   Iodine Anaphylaxis   Iohexol     Desc: Pt states [place] 5 [place] and experienced severe sob and swelling. We did her scan w/o iv contrast today.        Thanks., Onset Date: [id number]      Medication List       Accurate as of Jul 28, 2018  3:23 PM. If you have any questions, ask your nurse or doctor.        STOP taking these medications   NovoLOG FlexPen 100 UNIT/ML FlexPen Generic drug:  insulin aspart Stopped by:   Elayne Snare, MD   pravastatin 20 MG tablet Commonly known as:  PRAVACHOL Stopped by:  Elayne Snare, MD   Semaglutide(0.25 or 0.5MG /DOS) 2 MG/1.5ML Sopn Commonly known as:  Ozempic (0.25 or 0.5 MG/DOSE) Stopped by:  Elayne Snare, MD     TAKE these medications  glucose blood test strip Check blood glucose 4 time daily (before meals and at bed time). E11.29. One touch [place] meter. What changed:  additional instructions   Lancet Device Misc Check blood glucose 4 time daily (before meals and at bed time). E11.29. One touch [place] meter. What changed:  additional instructions   levothyroxine 300 MCG tablet Commonly known as:  SYNTHROID TAKE 1 TABLET BY MOUTH EVERY DAY BEFORE BREAKFAST   MULTIVITAL PO Take by mouth as needed.   Pen Needles 31G X 6 MM Misc 1 application by Does not apply route at bedtime.   B-D UF III MINI PEN NEEDLES 31G X 5 MM Misc Generic drug:  Insulin Pen Needle USE AS DIRECTED AT BEDTIME       Allergies:  Allergies  Allergen Reactions  . Betadine [Povidone Iodine]     Sob, swelling  . Ibuprofen Shortness Of Breath, Swelling and Anaphylaxis    And chest pain.  Tolerates naproxen without problems  . Iodinated Diagnostic Agents Anaphylaxis  . Iodine Anaphylaxis  . Iohexol      Desc: Pt states she had a CT [place] approx 5 yrs ago and experienced severe sob and swelling. We did her scan w/o iv contrast today.        Thanks., Onset Date: [id number]     Past Medical History:  Diagnosis Date  . Abnormal Pap smear early 2000's   had colposcopy, no additional treatment. normal paps since  . Chronic kidney disease 2018   one kidney  . Diabetes mellitus without complication ([place])   . Glaucoma    no eye drops  . History of chickenpox   . Hypothyroidism 1993  . Multiple thyroid nodules   . Pyelonephritis 1993   when pregnant; subsequently found to have shrunken, nonfunctioning kidney on one side  . Unilateral renal atrophy     Past Surgical  History:  Procedure Laterality Date  . DILITATION & CURRETTAGE/HYSTROSCOPY WITH NOVASURE ABLATION N/A 03/02/2013   Procedure: DILATATION & CURETTAGE/HYSTEROSCOPY WITH NOVASURE ABLATION;  Surgeon: Olga Millers, MD;  Location: [facility];  Service: Gynecology;  Laterality: N/A;    Family History  Problem Relation Age of Onset  . Asthma Brother   . Diabetes Brother   . Diabetes Maternal Aunt   . Diabetes Maternal Uncle   . Diabetes Maternal Grandmother   . Cancer Maternal Grandfather        prostate cancer  . Colon cancer Neg Hx   . Rectal cancer Neg Hx   . Stomach cancer Neg Hx     Social History:  reports that she has never smoked. She has never used smokeless tobacco. She reports current alcohol use. She reports that she does not use drugs.   Review of Systems   Lipid history: Her baseline LDL has been high, last 125 when she was told to take pravastatin However she thinks even with taking it with food she was having nausea and stopped it  Last LDL was improved but triglycerides were high  Currently not on any medication She has seen the dietitian in 2028 for meal planning   Lab Results  Component Value Date   CHOL 175 05/14/2018   CHOL 181 02/26/2018   CHOL 197 08/20/2017   Lab Results  Component Value Date   HDL 32.80 (L) 05/14/2018   HDL 37.10 (L) 02/26/2018   HDL 38.80 (L) 08/20/2017   Lab Results  Component Value Date   LDLCALC 125 (H) 02/26/2018   [place]  133 (H) 08/20/2017   LDLCALC 100 04/23/2015   Lab Results  Component Value Date   TRIG 213.0 (H) 05/14/2018   TRIG 93.0 02/26/2018   TRIG 125.0 08/20/2017   Lab Results  Component Value Date   CHOLHDL 5 05/14/2018   CHOLHDL 5 02/26/2018   CHOLHDL 5 08/20/2017   Lab Results  Component Value Date   LDLDIRECT 102.0 05/14/2018            Hypertension: Well controlled without any medications, had been on ramipril previously  BP Readings from Last 3 Encounters:  07/28/18 128/80  06/18/18  130/70  02/26/18 122/70    Most recent eye exam was in 3/19  Most recent foot exam:4/19    LABS:  No visits with results within 1 Week(s) from this visit.  Latest known visit with results is:  Documentation on 06/18/2018  Component Date Value Ref Range Status  . HM Diabetic Eye Exam 05/10/2017 No Retinopathy  No Retinopathy Final  . HM Mammogram 01/27/2018 0-4 Bi-Rad  0-4 Bi-Rad, Self Reported Normal Final   Bi-Rads 1 negative/ f/u in 1 year    Physical Examination:  BP 128/80   Pulse 77   Ht 5\' 8"  (1.727 m)   Wt 266 lb (120.7 kg)   SpO2 98%   BMI 40.45 kg/m    She has a twice normal smooth slightly firm goiter bilaterally, nontender No pedal edema   ASSESSMENT:  Diabetes type 2, current BMI 40  See history of present illness for detailed discussion of current diabetes management, blood sugar patterns and problems identified  A1c is now 7.4  Although she had excellent control with Ozempic she has not taken it for probably 3 months because of cost and her blood sugars are progressively higher She had excellent control with this and A1c in the normal range Currently her blood sugars are averaging 185 compared to 127 previously  She has been usually fairly good with her diet although can do better Has been trying to exercise as possible and her weight is still coming down  HYPOTHYROIDISM: She is symptomatically better with using 300 dosage of levothyroxine However her TSH was still slightly high in February She is compliant with her supplement  LIPIDS: She has had hypercholesterolemia which was relatively better in February She is intolerant to pravastatin with nausea and needs more current studies Discussed LDL of under 100 and triglycerides under 150    PLAN for diabetes:    Trial of Rybelsus with a sample  She will take 3 mg before breakfast daily for the first 2 weeks and then take 2 tablets together if no nausea  Does not need to take insulin  If  she is tolerating the 6 mg Rybelsus she will call for the prescription for the 7 mg tablet  Given instructions on how to activate her co-pay card  Discussed differences between this and Ozempic and how to take it and possible side effects of nausea with higher doses  She will continue to monitor blood sugars with some readings after meals also  Follow instructions given by dietitian for meal planning  Check thyroid levels and chemistry panel today  Patient Instructions  Take the 3 mg Rybelsus supplement in the morning with water, may take with thyroid supplement After 2 weeks if no nausea increase her medication to 2 tablets daily at the same time Before the medication is finished call for prescription for 7 mg  Check blood sugars on waking up days a  week  Also check blood sugars about 2 hours after meals and do this after different meals by rotation  Recommended blood sugar levels on waking up are 90-130 and about 2 hours after meal is 130-160  Please bring your blood sugar monitor to each visit, thank you    Counseling time on subjects discussed in assessment and plan sections is over 50% of today's 25 minute visit      Elayne Snare 07/28/2018, 3:23 PM   Note: This office note was prepared with Dragon voice recognition system technology. Any transcriptional errors that result from this process are unintentional.

## 2018-09-03 ENCOUNTER — Other Ambulatory Visit: Payer: Self-pay

## 2018-09-03 ENCOUNTER — Other Ambulatory Visit (INDEPENDENT_AMBULATORY_CARE_PROVIDER_SITE_OTHER): Payer: BC Managed Care – PPO

## 2018-09-03 DIAGNOSIS — E039 Hypothyroidism, unspecified: Secondary | ICD-10-CM | POA: Diagnosis not present

## 2018-09-03 DIAGNOSIS — E1165 Type 2 diabetes mellitus with hyperglycemia: Secondary | ICD-10-CM | POA: Diagnosis not present

## 2018-09-03 LAB — BASIC METABOLIC PANEL
BUN: 8 mg/dL (ref 6–23)
CO2: 22 mEq/L (ref 19–32)
Calcium: 9.1 mg/dL (ref 8.4–10.5)
Chloride: 105 mEq/L (ref 96–112)
Creatinine, Ser: 0.93 mg/dL (ref 0.40–1.20)
GFR: 76.91 mL/min (ref >60.00)
Glucose, Bld: 101 mg/dL — ABNORMAL HIGH (ref 70–99)
Potassium: 3.9 mEq/L (ref 3.5–5.1)
Sodium: 136 mEq/L (ref 135–145)

## 2018-09-03 LAB — T4, FREE: Free T4: 0.58 ng/dL — ABNORMAL LOW (ref 0.60–1.60)

## 2018-09-03 LAB — TSH: TSH: 6.55 u[IU]/mL — ABNORMAL HIGH (ref 0.35–4.50)

## 2018-09-07 ENCOUNTER — Ambulatory Visit: Payer: BLUE CROSS/BLUE SHIELD | Admitting: Endocrinology

## 2018-09-10 ENCOUNTER — Ambulatory Visit (INDEPENDENT_AMBULATORY_CARE_PROVIDER_SITE_OTHER): Payer: BC Managed Care – PPO | Admitting: Endocrinology

## 2018-09-10 ENCOUNTER — Other Ambulatory Visit: Payer: Self-pay

## 2018-09-10 ENCOUNTER — Encounter: Payer: Self-pay | Admitting: Endocrinology

## 2018-09-10 VITALS — BP 130/82 | HR 89 | Ht 68.0 in | Wt 271.6 lb

## 2018-09-10 DIAGNOSIS — E1165 Type 2 diabetes mellitus with hyperglycemia: Secondary | ICD-10-CM

## 2018-09-10 DIAGNOSIS — E039 Hypothyroidism, unspecified: Secondary | ICD-10-CM

## 2018-09-10 MED ORDER — RYBELSUS 3 MG PO TABS: 3.0000 mg | ORAL_TABLET | Freq: Every day | ORAL | 2 refills | Status: DC

## 2018-09-10 NOTE — Progress Notes (Signed)
Patient ID: Gail Barr, female   DOB: 05-Jan-1966, 53 y.o.   MRN: [id number]          Reason for Appointment: Endocrinology follow-up  Referring physician: Wilfred Lacy   History of Present Illness:          Date of diagnosis of type 2 diabetes mellitus:  2015    Background history:   She had an upper normal A1c of 6.5 in 2014 and then subsequently A1c was in the diabetic range Appears that her blood sugars have been poorly controlled with progressive rise in A1c Follow-up for diabetes has been infrequent and she has not had a consistent primary care provider also She had been treated with metformin initially Subsequently had been on Farxiga in 2016 and also Invokamet subsequently  In 2/19 she had marked hyperglycemia at the emergency room when seen for headache and blurred vision with glucose of 539.  Recent history:        Non-insulin hypoglycemic drugs the patient is taking are: Rybelsus 3 mg daily  A1c last was 9.4 done in March 2019 and is now 5.9  Current management, blood sugar patterns and problems identified:  She has taking Rybelsus since her last visit in 5/20  She was told to take 3 mg for the first 2 weeks and then go up to 2 tablets daily trial, she was supposed to call for prescription for the 7 mg when this was finished  However she only took the 3 mg tablets and claims that she only ran out yesterday even though she is coming back 6 weeks later  She does think that the Rybelsus decreased appetite  However despite trying to exercise and walk about 5 miles she has gained weight  Her blood sugars reportedly have been mostly near normal with highest reading only 156  No nausea with Rybelsus 3 mg  Her lab glucose was normal in the afternoon  She thinks her glucose was 114 fasting this morning but did not bring her monitor for download        Side effects from medications have been: Diarrhea, abdominal pain from metformin  Compliance with the medical  regimen: Better   Glucose monitoring:  done 1-2 times a day         Glucometer: One Touch.       Blood Glucose readings by recall: Range 90-156, some after meals  Previous readings:   PRE-MEAL Fasting Lunch Dinner Bedtime Overall  Glucose range:  150-237    164-261  117-321  Mean/median:  150  170  187  187  185    Self-care: The diet that the patient has been following is: tries to limit high-fat foods.      Typical meal intake: Breakfast is eggs with spinach Lunch usually [place] salad and pita bread Dinner is rice or pasta dishes or a small snack               Dietician visit, most recent: 1/20               Exercise: walks up to 5 miles daily  Weight history:  Wt Readings from Last 3 Encounters:  09/10/18 271 lb 9.6 oz (123.2 kg)  07/28/18 266 lb (120.7 kg)  06/18/18 270 lb (122.5 kg)   Glycemic control:   Lab Results  Component Value Date   HGBA1C 7.4 (A) 07/28/2018   HGBA1C 5.9 (A) 02/26/2018   HGBA1C 9.4% 06/17/2017   Lab Results  Component Value Date  MICROALBUR 41.5 (H) 04/24/2017   LDLCALC 125 (H) 02/26/2018   CREATININE 0.93 09/03/2018   Lab Results  Component Value Date   MICRALBCREAT 69.7 (H) 04/24/2017    Lab Results  Component Value Date   FRUCTOSAMINE 258 05/14/2018   FRUCTOSAMINE 277 07/09/2017    Problem 2:  HYPOTHYROIDISM  She was diagnosed to have hypothyroidism soon after her pregnancy around the year 1995 At that time she had a swelling in her thyroid and no other significant symptoms She was started on thyroid supplements and has been on these and variable doses over the last several years More recently she has been taking mostly 200 mcg She thinks that when her thyroid levels are lower when she is not taking her levothyroxine regularly she only has some difficulty with remembering things and some thinning of her hair but usually no cold intolerance or unusual fatigue She also tends to have more constipation  Although she was  initially started on 200 mcg on 04/24/2017 because of her TSH being higher 2 weeks later another 50 mcg was added  TSH was still high when she was taking separate prescriptions for 200 and 50 mcg tablets Since 12/19 she has been taking 300 mcg of levothyroxine  Recently she thinks her energy level has been better and she has not been feeling a sluggish Has been taking her levothyroxine with water in the morning. However she thinks that occasionally she has been forgetting her supplement in the morning  Her TSH was improved but still high at 6.55   GOITER: She has also had swelling in her thyroid for several years and her ultrasound shows only heterogenous tissue and no nodules She says that she was having some swelling and tenderness on the right thyroid area a few days ago but this is better now   Lab Results  Component Value Date   TSH 6.55 (H) 09/03/2018   TSH 8.20 (H) 05/14/2018   TSH 12.17 (H) 02/26/2018   FREET4 0.58 (L) 09/03/2018   FREET4 0.54 (L) 05/14/2018   FREET4 0.47 (L) 02/26/2018       Allergies as of 09/10/2018      Reactions   Betadine [povidone Iodine]    Sob, swelling   Ibuprofen Shortness Of Breath, Swelling, Anaphylaxis   And chest pain.  Tolerates naproxen without problems   Iodinated Diagnostic Agents Anaphylaxis   Iodine Anaphylaxis   Iohexol     Desc: Pt states [place] 5 [place] and experienced severe sob and swelling. We did her scan w/o iv contrast today.        Thanks., Onset Date: 29191660      Medication List       Accurate as of September 10, 2018  8:46 AM. If you have any questions, ask your nurse or doctor.        glucose blood test strip Check blood glucose 4 time daily (before meals and at bed time). E11.29. One touch Veiro meter. What changed: additional instructions   Lancet Device Misc Check blood glucose 4 time daily (before meals and at bed time). E11.29. One touch Veiro meter. What changed: additional  instructions   levothyroxine 300 MCG tablet Commonly known as: SYNTHROID TAKE 1 TABLET BY MOUTH EVERY DAY BEFORE BREAKFAST   MULTIVITAL PO Take by mouth as needed.   Pen Needles 31G X 6 MM Misc 1 application by Does not apply route at bedtime.   B-D UF III MINI PEN  NEEDLES 31G X 5 MM Misc Generic drug: Insulin Pen Needle USE AS DIRECTED AT BEDTIME       Allergies:  Allergies  Allergen Reactions  . Betadine [Povidone Iodine]     Sob, swelling  . Ibuprofen Shortness Of Breath, Swelling and Anaphylaxis    And chest pain.  Tolerates naproxen without problems  . Iodinated Diagnostic Agents Anaphylaxis  . Iodine Anaphylaxis  . Iohexol      Desc: Pt states she had a CT [place] approx 5 yrs ago and experienced severe sob and swelling. We did her scan w/o iv contrast today.        Thanks., Onset Date: [id number]     Past Medical History:  Diagnosis Date  . Abnormal Pap smear early 2000's   had colposcopy, no additional treatment. normal paps since  . Chronic kidney disease 2018   one kidney  . Diabetes mellitus without complication ([place])   . Glaucoma    no eye drops  . History of chickenpox   . Hypothyroidism 1993  . Multiple thyroid nodules   . Pyelonephritis 1993   when pregnant; subsequently found to have shrunken, nonfunctioning kidney on one side  . Unilateral renal atrophy     Past Surgical History:  Procedure Laterality Date  . DILITATION & CURRETTAGE/HYSTROSCOPY WITH NOVASURE ABLATION N/A 03/02/2013   Procedure: DILATATION & CURETTAGE/HYSTEROSCOPY WITH NOVASURE ABLATION;  Surgeon: Olga Millers, MD;  Location: [facility];  Service: Gynecology;  Laterality: N/A;    Family History  Problem Relation Age of Onset  . Asthma Brother   . Diabetes Brother   . Diabetes Maternal Aunt   . Diabetes Maternal Uncle   . Diabetes Maternal Grandmother   . Cancer Maternal Grandfather        prostate cancer  . Colon cancer Neg Hx   . Rectal cancer Neg Hx   . Stomach  cancer Neg Hx     Social History:  reports that she has never smoked. She has never used smokeless tobacco. She reports current alcohol use. She reports that she does not use drugs.   Review of Systems   Lipid history: Her baseline LDL has been high, last 125 when she was told to take pravastatin However she thinks even with taking it with food she was having nausea and stopped it  Last LDL was improved at 102 but triglycerides were high  Currently not on any medication She has seen the dietitian in 2028 for meal planning   Lab Results  Component Value Date   CHOL 175 05/14/2018   CHOL 181 02/26/2018   CHOL 197 08/20/2017   Lab Results  Component Value Date   HDL 32.80 (L) 05/14/2018   HDL 37.10 (L) 02/26/2018   HDL 38.80 (L) 08/20/2017   Lab Results  Component Value Date   LDLCALC 125 (H) 02/26/2018   LDLCALC 133 (H) 08/20/2017   LDLCALC 100 04/23/2015   Lab Results  Component Value Date   TRIG 213.0 (H) 05/14/2018   TRIG 93.0 02/26/2018   TRIG 125.0 08/20/2017   Lab Results  Component Value Date   CHOLHDL 5 05/14/2018   CHOLHDL 5 02/26/2018   CHOLHDL 5 08/20/2017   Lab Results  Component Value Date   LDLDIRECT 102.0 05/14/2018            Hypertension: Well controlled without any medications, had been on ramipril previously  BP Readings from Last 3 Encounters:  09/10/18 130/82  07/28/18 128/80  06/18/18 130/70  Most recent eye exam was in 3/19  Most recent foot exam:4/19    LABS:  No visits with results within 1 Week(s) from this visit.  Latest known visit with results is:  Lab on 09/03/2018  Component Date Value Ref Range Status  . Sodium 09/03/2018 136  135 - 145 mEq/L Final  . Potassium 09/03/2018 3.9  3.5 - 5.1 mEq/L Final  . Chloride 09/03/2018 105  96 - 112 mEq/L Final  . CO2 09/03/2018 22  19 - 32 mEq/L Final  . Glucose, Bld 09/03/2018 101* 70 - 99 mg/dL Final  . BUN 09/03/2018 8  6 - 23 mg/dL Final  . Creatinine, Ser 09/03/2018  0.93  0.40 - 1.20 mg/dL Final  . Calcium 09/03/2018 9.1  8.4 - 10.5 mg/dL Final  . GFR 09/03/2018 76.91  >60.00 mL/min Final  . Free T4 09/03/2018 0.58* 0.60 - 1.60 ng/dL Final   Comment: Specimens from patients who are undergoing biotin therapy and /or ingesting biotin supplements may contain high levels of biotin.  The higher biotin concentration in these specimens interferes with this Free T4 assay.  Specimens that contain high levels  of biotin may cause false high results for this Free T4 assay.  Please interpret results in light of the total clinical presentation of the patient.    Marland Kitchen TSH 09/03/2018 6.55* 0.35 - 4.50 uIU/mL Final    Physical Examination:  BP 130/82 (BP Location: Left Arm, Patient Position: Sitting, Cuff Size: Normal)   Pulse 89   Ht 5\' 8"  (1.727 m)   Wt 271 lb 9.6 oz (123.2 kg)   SpO2 97%   BMI 41.30 kg/m      ASSESSMENT:  Diabetes type 2, current BMI 40  See history of present illness for detailed discussion of current diabetes management, blood sugar patterns and problems identified  A1c was last 7.4  Although fructosamine is not available her home blood sugars are reportedly very good with taking 3 mg Rybelsus Lab glucose was normal 2 She does think that Rybelsus helps her cut back on her appetite but not clear why she has not lost weight She is exercising regularly   HYPOTHYROIDISM: She is symptomatically better with using 300 dosage of levothyroxine However her TSH is still slightly high although improved She is not always consistent with her supplement  LIPIDS: She has had hypercholesterolemia and needs follow-up on the next visit    PLAN for diabetes:    Restart Rybelsus  She will take 3 mg before breakfast daily  May consider increasing the dose if her A1c is not normal and she is having difficulty losing weight again  Discussed importance of weight loss  She will try to take her levothyroxine consistently along with her Rybelsus in  the morning for better compliance  She needs to alternate fasting and postprandial readings and bring her monitor on each visit  There are no Patient Instructions on file for this visit.       Elayne Snare 09/10/2018, 8:46 AM   Note: This office note was prepared with Dragon voice recognition system technology. Any transcriptional errors that result from this process are unintentional.

## 2018-09-10 NOTE — Patient Instructions (Signed)
Check blood sugars on waking up 1-2  days a week  Also check blood sugars about 2 hours after meals and do this after different meals by rotation  Recommended blood sugar levels on waking up are 90-130 and about 2 hours after meal is 130-160  Please bring your blood sugar monitor to each visit, thank you  

## 2018-11-04 ENCOUNTER — Other Ambulatory Visit: Payer: Self-pay

## 2018-11-04 DIAGNOSIS — Z20822 Contact with and (suspected) exposure to covid-19: Secondary | ICD-10-CM

## 2018-11-06 LAB — NOVEL CORONAVIRUS, NAA: SARS-CoV-2, NAA: NOT DETECTED

## 2018-11-06 LAB — SPECIMEN STATUS REPORT

## 2018-11-07 ENCOUNTER — Emergency Department (HOSPITAL_COMMUNITY)
Admission: EM | Admit: 2018-11-07 | Discharge: 2018-11-08 | Disposition: A | Discharge status: Home / Self Care | Payer: BC Managed Care – PPO | Attending: Emergency Medicine | Admitting: Emergency Medicine

## 2018-11-07 ENCOUNTER — Encounter (HOSPITAL_COMMUNITY): Payer: Self-pay

## 2018-11-07 ENCOUNTER — Other Ambulatory Visit: Payer: Self-pay

## 2018-11-07 ENCOUNTER — Emergency Department (HOSPITAL_COMMUNITY): Payer: BC Managed Care – PPO

## 2018-11-07 DIAGNOSIS — R05 Cough: Secondary | ICD-10-CM | POA: Insufficient documentation

## 2018-11-07 DIAGNOSIS — R079 Chest pain, unspecified: Secondary | ICD-10-CM

## 2018-11-07 DIAGNOSIS — E1122 Type 2 diabetes mellitus with diabetic chronic kidney disease: Secondary | ICD-10-CM | POA: Insufficient documentation

## 2018-11-07 DIAGNOSIS — J189 Pneumonia, unspecified organism: Secondary | ICD-10-CM | POA: Insufficient documentation

## 2018-11-07 DIAGNOSIS — R509 Fever, unspecified: Secondary | ICD-10-CM | POA: Diagnosis not present

## 2018-11-07 DIAGNOSIS — R51 Headache: Secondary | ICD-10-CM | POA: Diagnosis not present

## 2018-11-07 DIAGNOSIS — R42 Dizziness and giddiness: Secondary | ICD-10-CM | POA: Diagnosis not present

## 2018-11-07 DIAGNOSIS — R071 Chest pain on breathing: Secondary | ICD-10-CM | POA: Diagnosis not present

## 2018-11-07 DIAGNOSIS — Z794 Long term (current) use of insulin: Secondary | ICD-10-CM | POA: Insufficient documentation

## 2018-11-07 DIAGNOSIS — Z79899 Other long term (current) drug therapy: Secondary | ICD-10-CM | POA: Insufficient documentation

## 2018-11-07 DIAGNOSIS — N189 Chronic kidney disease, unspecified: Secondary | ICD-10-CM | POA: Diagnosis not present

## 2018-11-07 DIAGNOSIS — R0602 Shortness of breath: Secondary | ICD-10-CM | POA: Insufficient documentation

## 2018-11-07 LAB — BASIC METABOLIC PANEL
Anion gap: 10 (ref 5–15)
BUN: 9 mg/dL (ref 6–20)
CO2: 22 mmol/L (ref 22–32)
Calcium: 8.7 mg/dL — ABNORMAL LOW (ref 8.9–10.3)
Chloride: 101 mmol/L (ref 98–111)
Creatinine, Ser: 1.26 mg/dL — ABNORMAL HIGH (ref 0.44–1.00)
GFR calc Af Amer: 57 mL/min — ABNORMAL LOW (ref >60)
GFR calc non Af Amer: 49 mL/min — ABNORMAL LOW (ref >60)
Glucose, Bld: 212 mg/dL — ABNORMAL HIGH (ref 70–99)
Potassium: 4.5 mmol/L (ref 3.5–5.1)
Sodium: 133 mmol/L — ABNORMAL LOW (ref 135–145)

## 2018-11-07 LAB — I-STAT BETA HCG BLOOD, ED (MC, WL, AP ONLY): I-stat hCG, quantitative: <5 m[IU]/mL (ref <5)

## 2018-11-07 LAB — CBC
HCT: 36.8 % (ref 36.0–46.0)
Hemoglobin: 11.8 g/dL — ABNORMAL LOW (ref 12.0–15.0)
MCH: 26.1 pg (ref 26.0–34.0)
MCHC: 32.1 g/dL (ref 30.0–36.0)
MCV: 81.4 fL (ref 80.0–100.0)
Platelets: 331 10*3/uL (ref 150–400)
RBC: 4.52 MIL/uL (ref 3.87–5.11)
RDW: 15.1 % (ref 11.5–15.5)
WBC: 8.3 10*3/uL (ref 4.0–10.5)
nRBC: 0 % (ref 0.0–0.2)

## 2018-11-07 LAB — TROPONIN I (HIGH SENSITIVITY): Troponin I (High Sensitivity): 7 ng/L (ref <18)

## 2018-11-07 MED ORDER — DOXYCYCLINE HYCLATE 100 MG PO TABS
100.0000 mg | ORAL_TABLET | Freq: Once | ORAL | Status: AC
  Administered 2018-11-07: 100 mg via ORAL
  Filled 2018-11-07: qty 1

## 2018-11-07 MED ORDER — LACTATED RINGERS IV BOLUS
1000.0000 mL | Freq: Once | INTRAVENOUS | Status: AC
  Administered 2018-11-08: 1000 mL via INTRAVENOUS

## 2018-11-07 MED ORDER — AMOXICILLIN-POT CLAVULANATE ER 1000-62.5 MG PO TB12: 2.0000 | ORAL_TABLET | Freq: Two times a day (BID) | ORAL | 0 refills | Status: AC

## 2018-11-07 MED ORDER — DOXYCYCLINE HYCLATE 100 MG PO CAPS: 100.0000 mg | ORAL_CAPSULE | Freq: Two times a day (BID) | ORAL | 0 refills | Status: DC

## 2018-11-07 MED ORDER — AMOXICILLIN-POT CLAVULANATE ER 1000-62.5 MG PO TB12
2.0000 | ORAL_TABLET | Freq: Once | ORAL | Status: AC
  Administered 2018-11-07: 2 via ORAL
  Filled 2018-11-07: qty 2

## 2018-11-07 MED ORDER — SODIUM CHLORIDE 0.9% FLUSH: 3.0000 mL | Freq: Once | INTRAVENOUS | Status: DC

## 2018-11-07 MED ORDER — ACETAMINOPHEN 325 MG PO TABS
650.0000 mg | ORAL_TABLET | Freq: Once | ORAL | Status: AC
  Administered 2018-11-07: 650 mg via ORAL
  Filled 2018-11-07: qty 2

## 2018-11-07 NOTE — Discharge Instructions (Addendum)
Return to the hospital if you develop severe shortness of breath, coughing up blood, vomiting, or any other new/concerning symptoms.  Your chest x-ray shows pneumonia, however you will need a repeat chest x-ray in 3-4 weeks to ensure that this goes away.  Your primary care doctor can do this.

## 2018-11-07 NOTE — ED Triage Notes (Signed)
Pt states that two days ago she was dizzy, weak, began having L sided CP and headache, had covid test that was neg, fevers for the past 4 days

## 2018-11-07 NOTE — ED Provider Notes (Signed)
[facility] EMERGENCY DEPARTMENT Provider Note   CSN: [id number] Arrival date & time: 11/07/18  1924    History   Chief Complaint Chief Complaint  Patient presents with  . Chest Pain  . Fever    HPI Gail Barr is a 53 y.o. female.     HPI  53 year old female presents with chest pain.  She has been feeling ill for about 3 days.  She had a negative COVID test.  She has developed a fever and has had cough.  Some shortness of breath.  Chest pain with breathing.  No vomiting but a little nausea.  She has a headache and feels very lightheaded when standing.  Past Medical History:  Diagnosis Date  . Abnormal Pap smear early 2000's   had colposcopy, no additional treatment. normal paps since  . Chronic kidney disease 2018   one kidney  . Diabetes mellitus without complication ([place])   . Glaucoma    no eye drops  . History of chickenpox   . Hypothyroidism 1993  . Multiple thyroid nodules   . Pyelonephritis 1993   when pregnant; subsequently found to have shrunken, nonfunctioning kidney on one side  . Unilateral renal atrophy     Patient Active Problem List   Diagnosis Date Noted  . Morbid obesity ([place]) 06/18/2018  . Irregular periods 06/18/2018  . Enlarged thyroid 04/29/2017  . Nausea with vomiting 12/11/2015  . 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 Dizziness 12/02/2015  . Mixed hyperlipidemia 02/12/2015  . Type 2 diabetes mellitus with microalbuminuria, without long-term current use of insulin ([place]) 04/04/2014  . Microalbuminuria 04/04/2014  . Chest pain 01/25/2014  . 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 Bifascicular block 01/25/2014  . Abnormal menses 01/10/2013  . Vitamin D deficiency 07/08/2011  . Hypothyroidism 07/07/2011    Past Surgical History:  Procedure Laterality Date  . DILITATION & CURRETTAGE/HYSTROSCOPY WITH NOVASURE ABLATION N/A 03/02/2013   Procedure: DILATATION & CURETTAGE/HYSTEROSCOPY WITH NOVASURE ABLATION;   Surgeon: Olga Millers, MD;  Location: [facility];  Service: Gynecology;  Laterality: N/A;     OB History    Gravida  6   Para  3   Term      Preterm      AB  3   Living  3     SAB  3   TAB      Ectopic      Multiple      Live Births               Home Medications    Prior to Admission medications   Medication Sig Start Date End Date Taking? Authorizing Provider  amoxicillin-clavulanate (AUGMENTIN XR) 1000-62.5 MG 12 hr tablet Take 2 tablets by mouth 2 (two) times daily for 7 days. 11/07/18 11/14/18  Sherwood Gambler, MD  B-D UF III MINI PEN NEEDLES 31G X 5 MM MISC USE AS DIRECTED AT BEDTIME 04/24/17   [provider]  doxycycline (VIBRAMYCIN) 100 MG capsule Take 1 capsule (100 mg total) by mouth 2 (two) times daily. One po bid x 7 days 11/07/18   Sherwood Gambler, MD  glucose blood test strip Check blood glucose 4 time daily (before meals and at bed time). E11.29. One touch Veiro meter. Patient taking differently: Check blood glucose 2 time daily (before meals and at bed time). E11.29. One touch Veiro meter. 05/18/17   Nche, Charlene Brooke, NP  Insulin Pen Needle (PEN NEEDLES) 31G X 6 MM MISC 1 application  by Does not apply route at bedtime. 04/24/17   Nche, Charlene Brooke, NP  Lancet Device MISC Check blood glucose 4 time daily (before meals and at bed time). E11.29. One touch Veiro meter. Patient taking differently: Check blood glucose 3 time daily (before meals and at bed time). E11.29. One touch Veiro meter. 05/18/17   Nche, Charlene Brooke, NP  levothyroxine (SYNTHROID, LEVOTHROID) 300 MCG tablet TAKE 1 TABLET BY MOUTH EVERY DAY BEFORE BREAKFAST 06/09/18   Elayne Snare, MD  Multiple Vitamins-Minerals (MULTIVITAL PO) Take by mouth as needed.     [provider]  Semaglutide (RYBELSUS) 3 MG TABS Take 3 mg by mouth daily before breakfast. 09/10/18   Elayne Snare, MD    Family History Family History  Problem Relation Age of Onset  . Asthma Brother   . Diabetes  Brother   . Diabetes Maternal Aunt   . Diabetes Maternal Uncle   . Diabetes Maternal Grandmother   . Cancer Maternal Grandfather        prostate cancer  . Colon cancer Neg Hx   . Rectal cancer Neg Hx   . Stomach cancer Neg Hx     Social History Social History   Tobacco Use  . Smoking status: Never Smoker  . Smokeless tobacco: Never Used  Substance Use Topics  . Alcohol use: Yes    Comment: one drink every three months.  . Drug use: No     Allergies   Betadine [povidone iodine], Ibuprofen, Iodinated diagnostic agents, Iodine, and Iohexol   Review of Systems Review of Systems  Constitutional: Positive for fever.  Respiratory: Positive for cough.   Cardiovascular: Positive for chest pain.  Gastrointestinal: Positive for nausea. Negative for abdominal pain and vomiting.  Neurological: Positive for light-headedness and headaches.  All other systems reviewed and are negative.    Physical Exam Updated Vital Signs BP 138/70   Pulse 98   Temp (!) 102.4 F (39.1 C) (Oral)   Resp (!) 24   Ht 5\' 8"  (1.727 m)   Wt 122.5 kg   SpO2 98%   BMI 41.05 kg/m   Physical Exam Vitals signs and nursing note reviewed.  Constitutional:      Appearance: She is well-developed.  HENT:     Head: Normocephalic and atraumatic.     Right Ear: External ear normal.     Left Ear: External ear normal.     Nose: Nose normal.  Eyes:     General:        Right eye: No discharge.        Left eye: No discharge.  Cardiovascular:     Rate and Rhythm: Regular rhythm. Tachycardia present.     Heart sounds: Normal heart sounds.     Comments: HR low 100s Pulmonary:     Effort: Pulmonary effort is normal.     Breath sounds: Normal breath sounds.  Chest:     Chest wall: Tenderness present.  Abdominal:     Palpations: Abdomen is soft.     Tenderness: There is no abdominal tenderness.  Skin:    General: Skin is warm and dry.  Neurological:     Mental Status: She is alert.  Psychiatric:         Mood and Affect: Mood is not anxious.      ED Treatments / Results  Labs (all labs ordered are listed, but only abnormal results are displayed) Labs Reviewed  BASIC METABOLIC PANEL - Abnormal; Notable for the following components:  Result Value   Sodium 133 (*)    Glucose, Bld 212 (*)    Creatinine, Ser 1.26 (*)    Calcium 8.7 (*)    GFR calc non Af Amer 49 (*)    GFR calc Af Amer 57 (*)    All other components within normal limits  CBC - Abnormal; Notable for the following components:   Hemoglobin 11.8 (*)    All other components within normal limits  I-STAT BETA HCG BLOOD, ED ([facility], [facility], [facility] ONLY)  TROPONIN I (HIGH SENSITIVITY)  TROPONIN I (HIGH SENSITIVITY)    EKG EKG Interpretation  Date/Time:  Sunday November 07 2018 19:35:34 EDT Ventricular Rate:  110 PR Interval:  130 QRS Duration: 120 QT Interval:  322 QTC Calculation: 435 R Axis:   -47 Text Interpretation:  Sinus tachycardia Possible Left atrial enlargement Right bundle branch block Left anterior fascicular block ** Bifascicular block ** Abnormal ECG no significant change since 2017 Confirmed by Sherwood Gambler [phone]) on 11/07/2018 10:02:29 PM   Radiology Dg Chest 1 View  Result Date: 11/07/2018 CLINICAL DATA:  Dizziness, weakness, left chest pain and headache. Fevers for the past 4 days. EXAM: CHEST  1 VIEW COMPARISON:  12/12/2013. FINDINGS: Normal sized heart. Interval mild left perihilar ill-defined opacity with a multinodular appearance laterally and mild increase in size of the left hilum. Clear right lung. Unremarkable bones. IMPRESSION: Probable mild left perihilar pneumonia and reactive left hilar adenopathy. A neoplastic process is less likely but not excluded. Followup PA and lateral chest X-ray is recommended in 3-4 weeks following trial of antibiotic therapy to ensure resolution and exclude underlying malignancy. Electronically Signed   By: Claudie Revering M.D.   On: 11/07/2018 21:17    Procedures  Procedures (including critical care time)  Medications Ordered in ED Medications  sodium chloride flush (NS) 0.9 % injection 3 mL (has no administration in time range)  lactated ringers bolus 1,000 mL (has no administration in time range)  lactated ringers bolus 1,000 mL (has no administration in time range)  acetaminophen (TYLENOL) tablet 650 mg (650 mg Oral Given 11/07/18 2150)  amoxicillin-clavulanate (AUGMENTIN XR) 1000-62.5 MG per 12 hr tablet 2 tablet (2 tablets Oral Given 11/07/18 2253)  doxycycline (VIBRA-TABS) tablet 100 mg (100 mg Oral Given 11/07/18 2254)     Initial Impression / Assessment and Plan / ED Course  I have reviewed the triage vital signs and the nursing notes.  Pertinent labs & imaging results that were available during my care of the patient were reviewed by me and considered in my medical decision making (see chart for details).        Patient appears to have pneumonia.  She was tested for the novel coronavirus a couple days ago and was negative during the same illness so I do not think repeat testing is necessarily really needed.  At this point, she appears to not feel well because of pneumonia but does not appear critically ill or in need of admission or supplemental oxygenation.  I will give her antibiotics.  She was given IV fluids to help with supportive care.  After fluids, she can likely be discharged. Care to Dr. Leonette Monarch.  Gail Barr was evaluated in Emergency Department on 11/07/2018 for the symptoms described in the history of present illness. She was evaluated in the context of the global COVID-19 pandemic, which necessitated consideration that the patient might be at risk for infection with the SARS-CoV-2 virus that causes COVID-19. Institutional protocols and algorithms that  pertain to the evaluation of patients at risk for COVID-19 are in a state of rapid change based on information released by regulatory bodies including the CDC and federal and state  organizations. These policies and algorithms were followed during the patient's care in the ED.   Final Clinical Impressions(s) / ED Diagnoses   Final diagnoses:  Community acquired pneumonia of left lung, unspecified part of lung    ED Discharge Orders         Ordered    amoxicillin-clavulanate (AUGMENTIN XR) 1000-62.5 MG 12 hr tablet  2 times daily     11/07/18 2351    doxycycline (VIBRAMYCIN) 100 MG capsule  2 times daily     11/07/18 2351           Sherwood Gambler, MD 11/07/18 2358

## 2018-11-08 ENCOUNTER — Telehealth: Payer: Self-pay | Admitting: Nurse Practitioner

## 2018-11-08 LAB — TROPONIN I (HIGH SENSITIVITY): Troponin I (High Sensitivity): 7 ng/L (ref <18)

## 2018-11-08 MED ORDER — ONDANSETRON 4 MG PO TBDP: 4.0000 mg | ORAL_TABLET | Freq: Three times a day (TID) | ORAL | 0 refills | Status: AC | PRN

## 2018-11-08 NOTE — Telephone Encounter (Signed)
I called and left message on patient voicemail to call office and reschedule appointment scheduled for 11/11/2018. Gail Barr is not in the office for in person visits on 11/11/2018, provider works remote. Left message that Gail Barr is in the office for in person visits on Tuesday, Wednesday and Friday.

## 2018-11-08 NOTE — ED Provider Notes (Signed)
I assumed care of this patient from Dr. Regenia Skeeter at 2330.  Please see their note for further details of Hx, PE.  Briefly patient is a 53 y.o. female who presented with chest pain and fever, found to have pneumonia. Given Abx. Pending IVF.   Current plan is to reassess.  Following IVF, patient feels better. Still stable for DC.   The patient appears reasonably screened and/or stabilized for discharge and I doubt any other medical condition or other [facility] requiring further screening, evaluation, or treatment in the ED at this time prior to discharge.  Disposition: Discharge  Condition: Good  I have discussed the results, Dx and Tx plan with the patient and husband who expressed understanding and agree(s) with the plan. Discharge instructions discussed at great length. The patient and husband were given strict return precautions who verbalized understanding of the instructions. No further questions at time of discharge.    ED Discharge Orders         Ordered    ondansetron (ZOFRAN ODT) 4 MG disintegrating tablet  Every 8 hours PRN     11/08/18 0354    amoxicillin-clavulanate (AUGMENTIN XR) 1000-62.5 MG 12 hr tablet  2 times daily     11/07/18 2351    doxycycline (VIBRAMYCIN) 100 MG capsule  2 times daily     11/07/18 2351           Follow Up: Flossie Buffy, NP [place] [address] [phone]  Schedule an appointment as soon as possible for a visit in 3 days   [place] [address] [phone] [place]  If symptoms worsen       Leonette Monarch Grayce Sessions, MD 11/08/18 [phone]

## 2018-11-11 ENCOUNTER — Telehealth: Payer: Self-pay | Admitting: Nurse Practitioner

## 2018-11-11 ENCOUNTER — Inpatient Hospital Stay: Payer: BC Managed Care – PPO | Admitting: Nurse Practitioner

## 2018-11-11 NOTE — Telephone Encounter (Signed)
Please change appt to virtual appt

## 2018-11-11 NOTE — Telephone Encounter (Signed)
Questions for Screening COVID-19  Symptom onset: n/a  Travel or Contacts: no  During this illness, did/does the patient experience any of the following symptoms? Fever >100.14F []   Yes [x]   No []   Unknown Subjective fever (felt feverish) []   Yes [x]   No []   Unknown Chills []   Yes [x]   No []   Unknown Muscle aches (myalgia) []   Yes [x]   No []   Unknown Runny nose (rhinorrhea) []   Yes [x]   No []   Unknown Sore throat [x]   Yes []   No []   Unknown Cough (new onset or worsening of chronic cough) [x]   Yes []   No []   Unknown  Shortness of breath (dyspnea) []   Yes [x]   No []   Unknown Nausea or vomiting []   Yes [x]   No []   Unknown Headache [x]   Yes []   No []   Unknown Abdominal pain  []   Yes [x]   No []   Unknown Diarrhea (?3 loose/looser than normal stools/24hr period) []   Yes [x]   No []   Unknown Other, specify:  Pt went to ED for pneumonia on 11/07/2018. Pt stated her Covid test was negative but pt still has symptoms from pneumonia: burning sensation in throat area,loss voice,coughing only at night,headache in morning only, temp 97.5.   Ok to keep F2F appt tomorrow? (pt request F2F). Please advise.

## 2018-11-11 NOTE — Telephone Encounter (Signed)
LVM for the pt to call back, need to inform message below. [place] for triage nurse to give detail message.

## 2018-11-12 ENCOUNTER — Other Ambulatory Visit (INDEPENDENT_AMBULATORY_CARE_PROVIDER_SITE_OTHER): Payer: BC Managed Care – PPO

## 2018-11-12 ENCOUNTER — Telehealth: Payer: Self-pay

## 2018-11-12 ENCOUNTER — Encounter: Payer: Self-pay | Admitting: Nurse Practitioner

## 2018-11-12 ENCOUNTER — Other Ambulatory Visit: Payer: Self-pay

## 2018-11-12 ENCOUNTER — Ambulatory Visit (INDEPENDENT_AMBULATORY_CARE_PROVIDER_SITE_OTHER): Payer: BC Managed Care – PPO | Admitting: Nurse Practitioner

## 2018-11-12 VITALS — BP 124/74 | HR 94 | Temp 97.5°F | Ht 68.0 in | Wt 259.4 lb

## 2018-11-12 DIAGNOSIS — E039 Hypothyroidism, unspecified: Secondary | ICD-10-CM

## 2018-11-12 DIAGNOSIS — E1165 Type 2 diabetes mellitus with hyperglycemia: Secondary | ICD-10-CM | POA: Diagnosis not present

## 2018-11-12 DIAGNOSIS — J189 Pneumonia, unspecified organism: Secondary | ICD-10-CM

## 2018-11-12 DIAGNOSIS — J181 Lobar pneumonia, unspecified organism: Secondary | ICD-10-CM

## 2018-11-12 LAB — T4, FREE: Free T4: 1.03 ng/dL (ref 0.60–1.60)

## 2018-11-12 LAB — COMPREHENSIVE METABOLIC PANEL
ALT: 48 U/L — ABNORMAL HIGH (ref 0–35)
AST: 27 U/L (ref 0–37)
Albumin: 3.9 g/dL (ref 3.5–5.2)
Alkaline Phosphatase: 118 U/L — ABNORMAL HIGH (ref 39–117)
BUN: 9 mg/dL (ref 6–23)
CO2: 28 mEq/L (ref 19–32)
Calcium: 9.5 mg/dL (ref 8.4–10.5)
Chloride: 102 mEq/L (ref 96–112)
Creatinine, Ser: 0.98 mg/dL (ref 0.40–1.20)
GFR: 72.35 mL/min (ref >60.00)
Glucose, Bld: 168 mg/dL — ABNORMAL HIGH (ref 70–99)
Potassium: 3.7 mEq/L (ref 3.5–5.1)
Sodium: 137 mEq/L (ref 135–145)
Total Bilirubin: 0.4 mg/dL (ref 0.2–1.2)
Total Protein: 8.1 g/dL (ref 6.0–8.3)

## 2018-11-12 LAB — HEMOGLOBIN A1C: Hgb A1c MFr Bld: 6.8 % — ABNORMAL HIGH (ref 4.6–6.5)

## 2018-11-12 LAB — TSH: TSH: 0.47 u[IU]/mL (ref 0.35–4.50)

## 2018-11-12 MED ORDER — BENZONATATE 100 MG PO CAPS: 100.0000 mg | ORAL_CAPSULE | Freq: Three times a day (TID) | ORAL | 0 refills | Status: DC | PRN

## 2018-11-12 MED ORDER — ALBUTEROL SULFATE HFA 108 (90 BASE) MCG/ACT IN AERS: 1.0000 | INHALATION_SPRAY | Freq: Four times a day (QID) | RESPIRATORY_TRACT | 0 refills | Status: DC | PRN

## 2018-11-12 MED ORDER — GUAIFENESIN ER 600 MG PO TB12: 600.0000 mg | ORAL_TABLET | Freq: Two times a day (BID) | ORAL | 0 refills | Status: DC | PRN

## 2018-11-12 NOTE — Progress Notes (Signed)
Subjective:  Patient ID: Gail Barr, female    DOB: 1965/07/02  Age: 53 y.o. MRN: [id number]  CC: Hospitalization Follow-up (ED follow up--pt still has wheezing at times,)  Pneumonia She complains of cough, shortness of breath and wheezing. There is no chest tightness, difficulty breathing, frequent throat clearing, hemoptysis, hoarse voice or sputum production. This is a new problem. The current episode started in the past 7 days. The problem occurs constantly. The problem has been unchanged. The cough is non-productive. Associated symptoms include dyspnea on exertion, malaise/fatigue and a sore throat. Pertinent negatives include no appetite change, chest pain, fever, headaches, heartburn, nasal congestion, orthopnea, PND, postnasal drip, rhinorrhea, sneezing or trouble swallowing. Her symptoms are aggravated by any activity. Her symptoms are alleviated by rest. She reports minimal improvement on treatment. Ineffective treatments: oral abx (augmentin and doxycycline) There are no known risk factors for lung disease.  diagnosed with CAP-left perihilar 11/07/2018  Reviewed past Medical, Social and Family history today.  Outpatient Medications Prior to Visit  Medication Sig Dispense Refill   amoxicillin-clavulanate (AUGMENTIN XR) 1000-62.5 MG 12 hr tablet Take 2 tablets by mouth 2 (two) times daily for 7 days. 28 tablet 0   B-D UF III MINI PEN NEEDLES 31G X 5 MM MISC USE AS DIRECTED AT BEDTIME  2   doxycycline (VIBRAMYCIN) 100 MG capsule Take 1 capsule (100 mg total) by mouth 2 (two) times daily. One po bid x 7 days 14 capsule 0   glucose blood test strip Check blood glucose 4 time daily (before meals and at bed time). E11.29. One touch Veiro meter. (Patient taking differently: Check blood glucose 2 time daily (before meals and at bed time). E11.29. One touch Veiro meter.) 100 each 12   Insulin Pen Needle (PEN NEEDLES) 31G X 6 MM MISC 1 application by Does not apply route at bedtime. 100  each 2   Lancet Device MISC Check blood glucose 4 time daily (before meals and at bed time). E11.29. One touch Veiro meter. (Patient taking differently: Check blood glucose 3 time daily (before meals and at bed time). E11.29. One touch Veiro meter.) 100 each 12   levothyroxine (SYNTHROID, LEVOTHROID) 300 MCG tablet TAKE 1 TABLET BY MOUTH EVERY DAY BEFORE BREAKFAST 90 tablet 1   Multiple Vitamins-Minerals (MULTIVITAL PO) Take by mouth as needed.      Semaglutide (RYBELSUS) 3 MG TABS Take 3 mg by mouth daily before breakfast. 30 tablet 2   No facility-administered medications prior to visit.     ROS See HPI  Objective:  BP 124/74    Pulse 94    Temp (!) 97.5 F (36.4 C) (Tympanic)    Ht 5\' 8"  (1.727 m)    Wt 259 lb 6.4 oz (117.7 kg)    SpO2 97%    BMI 39.44 kg/m   BP Readings from Last 3 Encounters:  11/12/18 124/74  11/08/18 123/71  09/10/18 130/82    Wt Readings from Last 3 Encounters:  11/12/18 259 lb 6.4 oz (117.7 kg)  11/07/18 270 lb (122.5 kg)  09/10/18 271 lb 9.6 oz (123.2 kg)    Physical Exam HENT:     Mouth/Throat:     Mouth: Mucous membranes are moist.     Pharynx: Posterior oropharyngeal erythema present. No oropharyngeal exudate.  Eyes:     Extraocular Movements: Extraocular movements intact.     Conjunctiva/sclera: Conjunctivae normal.  Neck:     Musculoskeletal: Normal range of motion and neck supple.  Thyroid: No thyroid mass, thyromegaly or thyroid tenderness.     Trachea: Trachea normal.  Cardiovascular:     Rate and Rhythm: Normal rate and regular rhythm.     Pulses: Normal pulses.     Heart sounds: Normal heart sounds.  Pulmonary:     Effort: Pulmonary effort is normal.     Breath sounds: Normal breath sounds.  Musculoskeletal:     Right lower leg: No edema.     Left lower leg: No edema.  Lymphadenopathy:     Cervical: No cervical adenopathy.     Right cervical: No superficial, deep or posterior cervical adenopathy.    Left cervical: No  superficial, deep or posterior cervical adenopathy.  Neurological:     Mental Status: She is alert and oriented to person, place, and time.     Lab Results  Component Value Date   WBC 8.3 11/07/2018   HGB 11.8 (L) 11/07/2018   HCT 36.8 11/07/2018   PLT 331 11/07/2018   GLUCOSE 168 (H) 11/12/2018   CHOL 175 05/14/2018   TRIG 213.0 (H) 05/14/2018   HDL 32.80 (L) 05/14/2018   LDLDIRECT 102.0 05/14/2018   LDLCALC 125 (H) 02/26/2018   ALT 48 (H) 11/12/2018   AST 27 11/12/2018   NA 137 11/12/2018   K 3.7 11/12/2018   CL 102 11/12/2018   CREATININE 0.98 11/12/2018   BUN 9 11/12/2018   CO2 28 11/12/2018   TSH 0.47 11/12/2018   INR 1.20 11/02/2015   HGBA1C 6.8 (H) 11/12/2018   MICROALBUR 41.5 (H) 04/24/2017    Dg Chest 1 View  Result Date: 11/07/2018 CLINICAL DATA:  Dizziness, weakness, left chest pain and headache. Fevers for the past 4 days. EXAM: CHEST  1 VIEW COMPARISON:  12/12/2013. FINDINGS: Normal sized heart. Interval mild left perihilar ill-defined opacity with a multinodular appearance laterally and mild increase in size of the left hilum. Clear right lung. Unremarkable bones. IMPRESSION: Probable mild left perihilar pneumonia and reactive left hilar adenopathy. A neoplastic process is less likely but not excluded. Followup PA and lateral chest X-ray is recommended in 3-4 weeks following trial of antibiotic therapy to ensure resolution and exclude underlying malignancy. Electronically Signed   By: Claudie Revering M.D.   On: 11/07/2018 21:17    Assessment & Plan:   Gail Barr was seen today for hospitalization follow-up.  Diagnoses and all orders for this visit:  Community acquired pneumonia of left lower lobe of lung ([place]) -     DG Chest 2 View; Future -     albuterol (VENTOLIN HFA) 108 (90 Base) MCG/ACT inhaler; Inhale 1-2 puffs into the lungs every 6 (six) hours as needed for wheezing or shortness of breath. -     guaiFENesin (MUCINEX) 600 MG 12 hr tablet; Take 1 tablet (600  mg total) by mouth 2 (two) times daily as needed for cough or to loosen phlegm. -     benzonatate (TESSALON) 100 MG capsule; Take 1-2 capsules (100-200 mg total) by mouth 3 (three) times daily as needed for cough.   I am having Gail Barr start on albuterol, guaiFENesin, and benzonatate. I am also having her maintain her Multiple Vitamins-Minerals (MULTIVITAL PO), Pen Needles, B-D UF III MINI PEN NEEDLES, glucose blood, Lancet Device, levothyroxine, Rybelsus, amoxicillin-clavulanate, and doxycycline.  Meds ordered this encounter  Medications   albuterol (VENTOLIN HFA) 108 (90 Base) MCG/ACT inhaler    Sig: Inhale 1-2 puffs into the lungs every 6 (six) hours as needed for wheezing or shortness  of breath.    Dispense:  6.7 g    Refill:  0    Order Specific Question:   Supervising Provider    Answer:   Lucille Passy [[id number]]   guaiFENesin (MUCINEX) 600 MG 12 hr tablet    Sig: Take 1 tablet (600 mg total) by mouth 2 (two) times daily as needed for cough or to loosen phlegm.    Dispense:  14 tablet    Refill:  0    Order Specific Question:   Supervising Provider    Answer:   Lucille Passy [[id number]]   benzonatate (TESSALON) 100 MG capsule    Sig: Take 1-2 capsules (100-200 mg total) by mouth 3 (three) times daily as needed for cough.    Dispense:  30 capsule    Refill:  0    Order Specific Question:   Supervising Provider    Answer:   Lucille Passy [[id number]]    Problem List Items Addressed This Visit    None    Visit Diagnoses    Community acquired pneumonia of left lower lobe of lung ([place])    -  Primary   Relevant Medications   albuterol (VENTOLIN HFA) 108 (90 Base) MCG/ACT inhaler   guaiFENesin (MUCINEX) 600 MG 12 hr tablet   benzonatate (TESSALON) 100 MG capsule   Other Relevant Orders   DG Chest 2 View       Follow-up: Return in about 4 weeks (around 12/10/2018) for CPE (fasting).  Wilfred Lacy, NP

## 2018-11-12 NOTE — Telephone Encounter (Signed)
Need to make sure theses symptoms is not new, if not [place] said it is ok for F2F today. LVM for pt to call back.

## 2018-11-12 NOTE — Telephone Encounter (Signed)
Questions for Screening COVID-19  Symptom onset: No  Travel or Contacts: No  During this illness, did/does the patient experience any of the following symptoms? Fever >100.35F []   Yes [x]   No []   Unknown Subjective fever (felt feverish) []   Yes [x]   No []   Unknown Chills []   Yes [x]   No []   Unknown Muscle aches (myalgia) []   Yes [x]   No []   Unknown Runny nose (rhinorrhea) []   Yes [x]   No []   Unknown Sore throat []   Yes [x]   No []   Unknown Cough (new onset or worsening of chronic cough) []   Yes [x]   No []   Unknown Shortness of breath (dyspnea) []   Yes [x]   No []   Unknown Nausea or vomiting []   Yes [x]   No []   Unknown Headache []   Yes [x]   No []   Unknown Abdominal pain  []   Yes [x]   No []   Unknown Diarrhea (?3 loose/looser than normal stools/24hr period) []   Yes [x]   No []   Unknown Other, specify:  Patient risk factors: Smoker? []   Current []   Former []   Never If female, currently pregnant? []   Yes []   No  Patient Active Problem List   Diagnosis Date Noted  . Morbid obesity ([place]) 06/18/2018  . Irregular periods 06/18/2018  . Enlarged thyroid 04/29/2017  . Nausea with vomiting 12/11/2015  . 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 Dizziness 12/02/2015  . Mixed hyperlipidemia 02/12/2015  . Type 2 diabetes mellitus with microalbuminuria, without long-term current use of insulin ([place]) 04/04/2014  . Microalbuminuria 04/04/2014  . Chest pain 01/25/2014  . 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 Bifascicular block 01/25/2014  . Abnormal menses 01/10/2013  . Vitamin Polo Mcmartin deficiency 07/08/2011  . Hypothyroidism 07/07/2011    Plan:  []   High risk for COVID-19 with red flags go to ED (with CP, SOB, weak/lightheaded, or fever > 101.5). Call ahead.  []   High risk for COVID-19 but stable. Inform provider and coordinate time for [facility] visit.   []   No red flags but URI signs or symptoms okay for [facility] visit.

## 2018-11-12 NOTE — Patient Instructions (Signed)
Return to lab in 3weeks for repeat CXR.   Community-Acquired Pneumonia, Adult Pneumonia is an infection of the lungs. It causes swelling in the airways of the lungs. Mucus and fluid may also build up inside the airways. One type of pneumonia can happen while a person is in a hospital. A different type can happen when a person is not in a hospital (community-acquired pneumonia).  What are the causes?  This condition is caused by germs (viruses, bacteria, or fungi). Some types of germs can be passed from one person to another. This can happen when you breathe in droplets from the cough or sneeze of an infected person. What increases the risk? You are more likely to develop this condition if you:  Have a long-term (chronic) disease, such as: ? Chronic obstructive pulmonary disease (COPD). ? Asthma. ? Cystic fibrosis. ? Congestive heart failure. ? Diabetes. ? Kidney disease.  Have HIV.  Have sickle cell disease.  Have had your spleen removed.  Do not take good care of your teeth and mouth (poor dental hygiene).  Have a medical condition that increases the risk of breathing in droplets from your own mouth and nose.  Have a weakened body defense system (immune system).  Are a smoker.  Travel to areas where the germs that cause this illness are common.  Are around certain animals or the places they live. What are the signs or symptoms?  A dry cough.  A wet (productive) cough.  Fever.  Sweating.  Chest pain. This often happens when breathing deeply or coughing.  Fast breathing or trouble breathing.  Shortness of breath.  Shaking chills.  Feeling tired (fatigue).  Muscle aches. How is this treated? Treatment for this condition depends on many things. Most adults can be treated at home. In some cases, treatment must happen in a hospital. Treatment may include:  Medicines given by mouth or through an IV tube.  Being given extra oxygen.  Respiratory therapy. In  rare cases, treatment for very bad pneumonia may include:  Using a machine to help you breathe.  Having a procedure to remove fluid from around your lungs. Follow these instructions at home: Medicines  Take over-the-counter and prescription medicines only as told by your doctor. ? Only take cough medicine if you are losing sleep.  If you were prescribed an antibiotic medicine, take it as told by your doctor. Do not stop taking the antibiotic even if you start to feel better. General instructions   Sleep with your head and neck raised (elevated). You can do this by sleeping in a recliner or by putting a few pillows under your head.  Rest as needed. Get at least 8 hours of sleep each night.  Drink enough water to keep your pee (urine) pale yellow.  Eat a healthy diet that includes plenty of vegetables, fruits, whole grains, low-fat dairy products, and lean protein.  Do not use any products that contain nicotine or tobacco. These include cigarettes, e-cigarettes, and chewing tobacco. If you need help quitting, ask your doctor.  Keep all follow-up visits as told by your doctor. This is important. How is this prevented? A shot (vaccine) can help prevent pneumonia. Shots are often suggested for:  People older than 51 years of age.  People older than 51 years of age who: ? Are having cancer treatment. ? Have long-term (chronic) lung disease. ? Have problems with their body's defense system. You may also prevent pneumonia if you take these actions:  Get the flu (  influenza) shot every year.  Go to the dentist as often as told.  Wash your hands often. If you cannot use soap and water, use hand sanitizer. Contact a doctor if:  You have a fever.  You lose sleep because your cough medicine does not help. Get help right away if:  You are short of breath and it gets worse.  You have more chest pain.  Your sickness gets worse. This is very serious if: ? You are an older adult.  ? Your body's defense system is weak.  You cough up blood. Summary  Pneumonia is an infection of the lungs.  Most adults can be treated at home. Some will need treatment in a hospital.  Drink enough water to keep your pee pale yellow.  Get at least 8 hours of sleep each night. This information is not intended to replace advice given to you by your health care provider. Make sure you discuss any questions you have with your health care provider. Document Released: 08/27/2007 Document Revised: 06/30/2018 Document Reviewed: 11/05/2017 Elsevier Patient Education  2020 [employer].

## 2018-11-15 ENCOUNTER — Encounter: Payer: Self-pay | Admitting: Nurse Practitioner

## 2018-11-15 ENCOUNTER — Other Ambulatory Visit: Payer: Self-pay

## 2018-11-16 ENCOUNTER — Encounter: Payer: Self-pay | Admitting: Endocrinology

## 2018-11-16 ENCOUNTER — Encounter: Payer: BC Managed Care – PPO | Admitting: Endocrinology

## 2018-11-16 ENCOUNTER — Other Ambulatory Visit: Payer: Self-pay

## 2018-11-17 ENCOUNTER — Encounter: Payer: Self-pay | Admitting: Nurse Practitioner

## 2018-11-17 ENCOUNTER — Telehealth: Payer: Self-pay | Admitting: Nurse Practitioner

## 2018-11-17 NOTE — Telephone Encounter (Signed)
Pt called stating she is coughing up mucus now but her throat is sore and hurt when cough, pt still has some hoarseness. Pt was wondering if Baldo Ash can write her a letter to return to work on 11/22/2018. Also disability form came over today--ok to fill out?   FYI--sweat stop now since she use the inhaler as directed from [place].   Please advise.      Copied from [place] [phone]. Topic: General - Other >> Nov 17, 2018 12:05 PM Sheran Luz wrote: Patient calling to state that she is still not feeling well after visit on 8/21. She is requesting a call back from [place] to discuss extended work note.

## 2018-11-17 NOTE — Progress Notes (Signed)
This encounter was created in error - please disregard.

## 2018-11-17 NOTE — Telephone Encounter (Signed)
Ok to complete form. Continue mucinex 600mg  BID and benzonatate as prescribed. Maintain adequate oral hydration.

## 2018-11-17 NOTE — Telephone Encounter (Signed)
Paper work and letter faxed.

## 2018-11-18 ENCOUNTER — Telehealth: Payer: Self-pay | Admitting: Endocrinology

## 2018-11-18 ENCOUNTER — Encounter: Payer: Self-pay | Admitting: Endocrinology

## 2018-11-18 NOTE — Telephone Encounter (Signed)
-----   Message from Arnoldo Hooker sent at 11/17/2018  4:01 PM EDT ----- Regarding: RE: Reschedule ## Called patient 1x on 08/262020 to schedule a virtual visit since we were unable to see patient in person on 11/16/2018  ## Called patient 2x on 11/16/2018 trying to reschedule. ----- Message ----- From: Elayne Snare, MD Sent: 11/17/2018   8:05 AM EDT To: [facility] Pool Subject: Reschedule                                     Please reschedule canceled appointment

## 2018-11-18 NOTE — Telephone Encounter (Signed)
Noted  

## 2018-11-18 NOTE — Telephone Encounter (Signed)
Mailing a Letter

## 2018-12-08 ENCOUNTER — Other Ambulatory Visit: Payer: Self-pay | Admitting: Endocrinology

## 2018-12-08 ENCOUNTER — Encounter: Payer: BC Managed Care – PPO | Admitting: Nurse Practitioner

## 2018-12-13 ENCOUNTER — Ambulatory Visit: Payer: BC Managed Care – PPO | Admitting: Endocrinology

## 2018-12-13 ENCOUNTER — Encounter: Payer: Self-pay | Admitting: Endocrinology

## 2018-12-13 ENCOUNTER — Ambulatory Visit (INDEPENDENT_AMBULATORY_CARE_PROVIDER_SITE_OTHER): Payer: BC Managed Care – PPO | Admitting: Endocrinology

## 2018-12-13 ENCOUNTER — Other Ambulatory Visit: Payer: Self-pay

## 2018-12-13 VITALS — BP 130/80 | HR 89 | Ht 68.0 in | Wt 269.7 lb

## 2018-12-13 DIAGNOSIS — E1165 Type 2 diabetes mellitus with hyperglycemia: Secondary | ICD-10-CM | POA: Diagnosis not present

## 2018-12-13 DIAGNOSIS — E039 Hypothyroidism, unspecified: Secondary | ICD-10-CM | POA: Diagnosis not present

## 2018-12-13 LAB — GLUCOSE, POCT (MANUAL RESULT ENTRY): POC Glucose: 234 mg/dl — AB (ref 70–99)

## 2018-12-13 MED ORDER — RYBELSUS 7 MG PO TABS: 1.0000 | ORAL_TABLET | Freq: Every day | ORAL | 3 refills | Status: DC

## 2018-12-13 NOTE — Patient Instructions (Addendum)
Take 1/2 pill Sundays, other days 300ug dose  Check blood sugars on waking up 2-3 days a week  Also check blood sugars about 2 hours after meals and do this after different meals by rotation  Recommended blood sugar levels on waking up are 90-130 and about 2 hours after meal is 130-160  Please bring your blood sugar monitor to each visit, thank you

## 2018-12-13 NOTE — Progress Notes (Signed)
Patient ID: Gail Barr, female   DOB: 1965-04-01, 53 y.o.   MRN: [id number]          Reason for Appointment: Endocrinology follow-up  Referring physician: Wilfred Lacy   History of Present Illness:          Date of diagnosis of type 2 diabetes mellitus:  2015    Background history:   She had an upper normal A1c of 6.5 in 2014 and then subsequently A1c was in the diabetic range Appears that her blood sugars have been poorly controlled with progressive rise in A1c Follow-up for diabetes has been infrequent and she has not had a consistent primary care provider also She had been treated with metformin initially Subsequently had been on Farxiga in 2016 and also Invokamet subsequently  In 2/19 she had marked hyperglycemia at the emergency room when seen for headache and blurred vision with glucose of 539.  Recent history:        Non-insulin hypoglycemic drugs the patient is taking are: Rybelsus 3 mg daily  A1c  is last 6.8 compared to 7.4  Current management, blood sugar patterns and problems identified:  She has taking Rybelsus since her visit in 5/20  She was having somewhat better blood sugars on her last visit in June but appears to have high readings sporadically  Overall she has not been able to comply with diet and exercise regimen for various reasons  Has checked only 3 readings at home lately and says she needs more test strips  Told to continue 3 mg since she was getting fairly good results  However she has not lost any weight despite saying that Rybelsus 3 mg still improves her satiety level  Today her blood sugar was over 200 but she had cake and ice cream during the night last night  Also she has not recovered completely from her respiratory infection last month and has not started back on her walking, was walking up to 5 miles a day previously        Side effects from medications have been: Diarrhea, abdominal pain from metformin   Glucose monitoring:   As below       Glucometer: One Touch Verio.       Blood Glucose readings    Recent range 132-147 with only 3 readings midday  Self-care: The diet that the patient has been following is: tries to limit high-fat foods.      Typical meal intake: Breakfast is eggs with spinach Lunch usually [place] salad and pita bread Dinner is rice or pasta dishes or a small snack               Dietician visit, most recent: 1/20   Weight history:  Wt Readings from Last 3 Encounters:  12/13/18 269 lb 11.2 oz (122.3 kg)  11/12/18 259 lb 6.4 oz (117.7 kg)  11/07/18 270 lb (122.5 kg)   Glycemic control:   Lab Results  Component Value Date   HGBA1C 6.8 (H) 11/12/2018   HGBA1C 7.4 (A) 07/28/2018   HGBA1C 5.9 (A) 02/26/2018   Lab Results  Component Value Date   MICROALBUR 41.5 (H) 04/24/2017   LDLCALC 125 (H) 02/26/2018   CREATININE 0.98 11/12/2018   Lab Results  Component Value Date   MICRALBCREAT 69.7 (H) 04/24/2017    Lab Results  Component Value Date   FRUCTOSAMINE 258 05/14/2018   FRUCTOSAMINE 277 07/09/2017    Problem 2:  HYPOTHYROIDISM  She was diagnosed to have hypothyroidism soon after  her pregnancy around the year 1995 At that time she had a swelling in her thyroid and no other significant symptoms She was started on thyroid supplements and has been on these and variable doses over the last several years More recently she has been taking mostly 200 mcg She thinks that when her thyroid levels are lower when she is not taking her levothyroxine regularly she only has some difficulty with remembering things and some thinning of her hair but usually no cold intolerance or unusual fatigue She also tends to have more constipation  Although she was initially started on 200 mcg on 04/24/2017 because of her TSH being higher 2 weeks later another 50 mcg was added  TSH was still high when she was taking separate prescriptions for 200 and 50 mcg tablets  Since 12/19 she has been taking  300 mcg of levothyroxine  She has been more regular with taking this every morning except when she was on antibiotics in August for pneumonia Recently feeling fairly good TSH back to normal now   GOITER: She has also had swelling in her thyroid for several years and her ultrasound shows only heterogenous tissue and no nodules She says that she was having some swelling and tenderness on the right thyroid area a few days ago but this is better now   Lab Results  Component Value Date   TSH 0.47 11/12/2018   TSH 6.55 (H) 09/03/2018   TSH 8.20 (H) 05/14/2018   FREET4 1.03 11/12/2018   FREET4 0.58 (L) 09/03/2018   FREET4 0.54 (L) 05/14/2018       Allergies as of 12/13/2018      Reactions   Betadine [povidone Iodine]    Sob, swelling   Ibuprofen Shortness Of Breath, Swelling, Anaphylaxis   And chest pain.  Tolerates naproxen without problems   Iodinated Diagnostic Agents Anaphylaxis   Iodine Anaphylaxis   Iohexol     Desc: Pt states [place] and experienced severe sob and swelling. We did her scan w/o iv contrast today.        Thanks., Onset Date: [id number]      Medication List       Accurate as of December 13, 2018  2:41 PM. If you have any questions, ask your nurse or doctor.        STOP taking these medications   benzonatate 100 MG capsule Commonly known as: TESSALON Stopped by: Elayne Snare, MD   doxycycline 100 MG capsule Commonly known as: VIBRAMYCIN Stopped by: Elayne Snare, MD   guaiFENesin 600 MG 12 hr tablet Commonly known as: Mucinex Stopped by: Elayne Snare, MD     TAKE these medications   albuterol 108 (90 Base) MCG/ACT inhaler Commonly known as: VENTOLIN HFA Inhale 1-2 puffs into the lungs every 6 (six) hours as needed for wheezing or shortness of breath.   glucose blood test strip Check blood glucose 4 time daily (before meals and at bed time). E11.29. One touch Veiro meter. What changed: additional instructions     Lancet Device Misc Check blood glucose 4 time daily (before meals and at bed time). E11.29. One touch Veiro meter. What changed: additional instructions   levothyroxine 300 MCG tablet Commonly known as: SYNTHROID TAKE 1 TABLET BY MOUTH EVERY DAY BEFORE BREAKFAST   MULTIVITAL PO Take by mouth as needed.   Pen Needles 31G X 6 MM Misc 1 application by Does not apply route at bedtime.  B-D UF III MINI PEN NEEDLES 31G X 5 MM Misc Generic drug: Insulin Pen Needle USE AS DIRECTED AT BEDTIME   Rybelsus 3 MG Tabs Generic drug: Semaglutide Take 3 mg by mouth daily before breakfast.       Allergies:  Allergies  Allergen Reactions   Betadine [Povidone Iodine]     Sob, swelling   Ibuprofen Shortness Of Breath, Swelling and Anaphylaxis    And chest pain.  Tolerates naproxen without problems   Iodinated Diagnostic Agents Anaphylaxis   Iodine Anaphylaxis   Iohexol      Desc: Pt states she [place] a [place] approx 5 yrs ago and experienced severe sob and swelling. We did her scan w/o iv contrast today.        Thanks., Onset Date: [id number]     Past Medical History:  Diagnosis Date   Abnormal Pap smear early 2000's   had colposcopy, no additional treatment. normal paps since   Chronic kidney disease 2018   one kidney   Diabetes mellitus without complication (HCC)    Glaucoma    no eye drops   History of chickenpox    Hypothyroidism 1993   Multiple thyroid nodules    Pyelonephritis 1993   when pregnant; subsequently found to have shrunken, nonfunctioning kidney on one side   Unilateral renal atrophy     Past Surgical History:  Procedure Laterality Date   DILITATION & CURRETTAGE/HYSTROSCOPY WITH NOVASURE ABLATION N/A 03/02/2013   Procedure: DILATATION & CURETTAGE/HYSTEROSCOPY WITH NOVASURE ABLATION;  Surgeon: Olga Millers, MD;  Location: [facility];  Service: Gynecology;  Laterality: N/A;    Family History  Problem Relation Age of Onset   Asthma  Brother    Diabetes Brother    Diabetes Maternal Aunt    Diabetes Maternal Uncle    Diabetes Maternal Grandmother    Cancer Maternal Grandfather        prostate cancer   Colon cancer Neg Hx    Rectal cancer Neg Hx    Stomach cancer Neg Hx     Social History:  reports that she has never smoked. She has never used smokeless tobacco. She reports current alcohol use. She reports that she does not use drugs.   Review of Systems   Lipid history: Her baseline LDL has been high, last 125 when she was told to take pravastatin However she she was having nausea and stopped it  Last LDL was improved at 102 but triglycerides were high  Currently not on any medication She has seen the dietitian in 2018 for meal planning  She is due for follow-up with PCP soon   Lab Results  Component Value Date   CHOL 175 05/14/2018   CHOL 181 02/26/2018   CHOL 197 08/20/2017   Lab Results  Component Value Date   HDL 32.80 (L) 05/14/2018   HDL 37.10 (L) 02/26/2018   HDL 38.80 (L) 08/20/2017   Lab Results  Component Value Date   LDLCALC 125 (H) 02/26/2018   LDLCALC 133 (H) 08/20/2017   LDLCALC 100 04/23/2015   Lab Results  Component Value Date   TRIG 213.0 (H) 05/14/2018   TRIG 93.0 02/26/2018   TRIG 125.0 08/20/2017   Lab Results  Component Value Date   CHOLHDL 5 05/14/2018   CHOLHDL 5 02/26/2018   CHOLHDL 5 08/20/2017   Lab Results  Component Value Date   LDLDIRECT 102.0 05/14/2018            Hypertension: Well controlled  without any medications, had been on ramipril previously  BP Readings from Last 3 Encounters:  12/13/18 130/80  11/12/18 124/74  11/08/18 123/71    Most recent eye exam was in 3/19  Most recent foot exam:4/19    LABS:  Office Visit on 12/13/2018  Component Date Value Ref Range Status   POC Glucose 12/13/2018 234* 70 - 99 mg/dl Final    Physical Examination:  BP 130/80 (BP Location: Left Arm, Patient Position: Sitting, Cuff Size:  Large)    Pulse 89    Ht 5\' 8"  (1.727 m)    Wt 269 lb 11.2 oz (122.3 kg)    SpO2 99%    BMI 41.01 kg/m      ASSESSMENT:  Diabetes type 2, current BMI 40  See history of present illness for detailed discussion of current diabetes management, blood sugar patterns and problems identified  A1c was last 7.4 and is now 6.8  Appears that she is not checking her blood sugars much and A1c was also a month old She does appear to have some postprandial hyperglycemia including on the lab last month when it was 168 and today it was over 200 However she has not been consistent with diet Also for various reasons has not done her walking are made any attempt to lose weight  She does think that Rybelsus helps her cut back on her appetite but she still is not motivated to watch her diet as well She may do better with 7 mg instead of 3 mg   HYPOTHYROIDISM: She is symptomatically better with using 300 dosage of levothyroxine However her TSH is now finally normal with her trying to be more consistent with her supplement  LIPIDS: She has had hypercholesterolemia and needs follow-up with her PCP on upcoming visit    PLAN:    Rybelsus 7 mg daily  She will take 6-1/2 tablets a week of the levothyroxine  More consistent monitoring of blood sugars including after meals  Follow-up in 2 months for repeat A1c  Patient Instructions  Take 1/2 pill Sundays, other days 300ug dose  Check blood sugars on waking up 2-3 days a week  Also check blood sugars about 2 hours after meals and do this after different meals by rotation  Recommended blood sugar levels on waking up are 90-130 and about 2 hours after meal is 130-160  Please bring your blood sugar monitor to each visit, thank you          Elayne Snare 12/13/2018, 2:41 PM   Note: This office note was prepared with Dragon voice recognition system technology. Any transcriptional errors that result from this process are unintentional.

## 2018-12-15 ENCOUNTER — Other Ambulatory Visit: Payer: Self-pay

## 2018-12-15 ENCOUNTER — Encounter: Payer: Self-pay | Admitting: Nurse Practitioner

## 2018-12-15 ENCOUNTER — Encounter: Payer: BC Managed Care – PPO | Admitting: Nurse Practitioner

## 2018-12-15 ENCOUNTER — Ambulatory Visit (INDEPENDENT_AMBULATORY_CARE_PROVIDER_SITE_OTHER): Payer: BC Managed Care – PPO | Admitting: Nurse Practitioner

## 2018-12-15 VITALS — BP 130/86 | HR 65 | Temp 96.7°F | Ht 68.0 in | Wt 267.0 lb

## 2018-12-15 DIAGNOSIS — E782 Mixed hyperlipidemia: Secondary | ICD-10-CM | POA: Diagnosis not present

## 2018-12-15 DIAGNOSIS — E1129 Type 2 diabetes mellitus with other diabetic kidney complication: Secondary | ICD-10-CM | POA: Diagnosis not present

## 2018-12-15 DIAGNOSIS — K5904 Chronic idiopathic constipation: Secondary | ICD-10-CM | POA: Diagnosis not present

## 2018-12-15 DIAGNOSIS — R1012 Left upper quadrant pain: Secondary | ICD-10-CM | POA: Diagnosis not present

## 2018-12-15 DIAGNOSIS — R809 Proteinuria, unspecified: Secondary | ICD-10-CM | POA: Diagnosis not present

## 2018-12-15 DIAGNOSIS — Z0000 Encounter for general adult medical examination without abnormal findings: Secondary | ICD-10-CM | POA: Diagnosis not present

## 2018-12-15 DIAGNOSIS — E559 Vitamin D deficiency, unspecified: Secondary | ICD-10-CM | POA: Diagnosis not present

## 2018-12-15 DIAGNOSIS — M25572 Pain in left ankle and joints of left foot: Secondary | ICD-10-CM

## 2018-12-15 DIAGNOSIS — Z0001 Encounter for general adult medical examination with abnormal findings: Secondary | ICD-10-CM

## 2018-12-15 LAB — MICROALBUMIN / CREATININE URINE RATIO
Creatinine,U: 110.4 mg/dL
Microalb Creat Ratio: 5.7 mg/g (ref 0.0–30.0)
Microalb, Ur: 6.2 mg/dL — ABNORMAL HIGH (ref 0.0–1.9)

## 2018-12-15 LAB — LIPID PANEL
Cholesterol: 226 mg/dL — ABNORMAL HIGH (ref 0–200)
HDL: 34.1 mg/dL — ABNORMAL LOW (ref >39.00)
NonHDL: 192.31
Total CHOL/HDL Ratio: 7
Triglycerides: 211 mg/dL — ABNORMAL HIGH (ref 0.0–149.0)
VLDL: 42.2 mg/dL — ABNORMAL HIGH (ref 0.0–40.0)

## 2018-12-15 LAB — LDL CHOLESTEROL, DIRECT: Direct LDL: 125 mg/dL

## 2018-12-15 LAB — VITAMIN D 25 HYDROXY (VIT D DEFICIENCY, FRACTURES): VITD: 10.92 ng/mL — ABNORMAL LOW (ref 30.00–100.00)

## 2018-12-15 NOTE — Progress Notes (Signed)
Subjective:    Patient ID: Andres Labrum, female    DOB: 1965-06-22, 53 y.o.   MRN: [id number]  Patient presents today for complete physical,  eval of LUQ ABD pain and left ankle pain.  Abdominal Pain This is a new problem. The current episode started more than 1 month ago. The onset quality is gradual. The problem occurs intermittently. The problem has been waxing and waning. The pain is located in the LUQ. The pain is moderate. The quality of the pain is aching and colicky. The abdominal pain does not radiate. Associated symptoms include constipation. Pertinent negatives include no anorexia, diarrhea, fever, flatus, hematochezia, melena, nausea, vomiting or weight loss. The pain is aggravated by certain positions, deep breathing, palpation and movement. The pain is relieved by being still. She has tried nothing for the symptoms. There is no history of abdominal surgery, colon cancer, Crohn's disease, GERD, irritable bowel syndrome, pancreatitis, PUD or ulcerative colitis.  Ankle Pain  Incident onset: no injury. There was no injury mechanism. The pain is present in the left ankle. The quality of the pain is described as aching and cramping. The pain has been intermittent since onset. Pertinent negatives include no inability to bear weight, loss of motion, loss of sensation, muscle weakness, numbness or tingling. She reports no foreign bodies present. The symptoms are aggravated by movement (worse in morning). Treatments tried: stretching. The treatment provided mild relief.  Constipation This is a chronic problem. The current episode started more than 1 year ago. The problem is unchanged. Her stool frequency is 2 to 3 times per week. The stool is described as pellet like. The patient is not on a high fiber diet. She does not exercise regularly. There has been adequate water intake. Associated symptoms include abdominal pain and bloating. Pertinent negatives include no anorexia, back pain, diarrhea,  difficulty urinating, fecal incontinence, fever, flatus, hematochezia, hemorrhoids, melena, nausea, rectal pain, vomiting or weight loss. Risk factors include obesity and change in medication usage/dosage. She has tried nothing for the symptoms. Her past medical history is significant for endocrine disease and metabolic disease. There is no history of abdominal surgery, inflammatory bowel disease, irritable bowel syndrome, neurologic disease, neuromuscular disease, psychiatric history or radiation treatment.  Left side abdominal pain is worse with palpation and rotation to right. She also noted mass on left side. She denies any recent injury.  Last colonoscopy 01/2018: colon polyps removed, benign pathology.  Sexual History (orientation,birth control, marital status, STD):married, sexually active, postmenopausal  Depression/Suicide: Depression screen [facility] 2/9 03/26/2018 04/24/2017 02/10/2014  Decreased Interest 0 0 0  Down, Depressed, Hopeless 0 0 0  PHQ - 2 Score 0 0 0   Vision:up to date, last done 09/2018 per patient  Dental:will schedule  Immunizations: (TDAP, Hep C screen, Pneumovax, Influenza, zoster)  Health Maintenance  Topic Date Due  . Eye exam for diabetics  05/10/2018  . Pneumococcal vaccine  12/19/2018*  . Flu Shot  06/22/2019*  . Tetanus Vaccine  12/15/2019*  . HIV Screening  12/15/2019*  . Hemoglobin A1C  05/15/2019  . Complete foot exam   12/15/2019  . Urine Protein Check  12/15/2019  . Mammogram  01/28/2020  . Pap Smear  01/25/2021  . Colon Cancer Screening  01/27/2028  *Topic was postponed. The date shown is not the original due date.   Diet:low carb and low fat.  Weight:  Wt Readings from Last 3 Encounters:  12/15/18 267 lb (121.1 kg)  12/13/18 269 lb 11.2 oz (122.3 kg)  11/12/18 259 lb 6.4 oz (117.7 kg)    Exercise:none  Fall Risk: Fall Risk  03/26/2018 04/24/2017 02/10/2014  Falls in the past year? 0 No No   Advanced Directive: Advanced Directives 03/26/2018   Does Patient Have a Medical Advance Directive? Yes  Type of Advance Directive -  Would patient like information on creating a medical advance directive? -  Pre-existing out of facility DNR order (yellow form or pink MOST form) -    Medications and allergies reviewed with patient and updated if appropriate.  Patient Active Problem List   Diagnosis Date Noted  . Chronic idiopathic constipation 12/15/2018  . Morbid obesity ([place]) 06/18/2018  . Irregular periods 06/18/2018  . Enlarged thyroid 04/29/2017  . Nausea with vomiting 12/11/2015  . 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 Dizziness 12/02/2015  . Mixed hyperlipidemia 02/12/2015  . Type 2 diabetes mellitus with microalbuminuria, without long-term current use of insulin ([place]) 04/04/2014  . Microalbuminuria 04/04/2014  . Chest pain 01/25/2014  . 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 Bifascicular block 01/25/2014  . Abnormal menses 01/10/2013  . Vitamin D deficiency 07/08/2011  . Hypothyroidism 07/07/2011    Current Outpatient Medications on File Prior to Visit  Medication Sig Dispense Refill  . B-D UF III MINI PEN NEEDLES 31G X 5 MM MISC USE AS DIRECTED AT BEDTIME  2  . glucose blood test strip Check blood glucose 4 time daily (before meals and at bed time). E11.29. One touch Veiro meter. (Patient taking differently: Check blood glucose 2 time daily (before meals and at bed time). E11.29. One touch Veiro meter.) 100 each 12  . Insulin Pen Needle (PEN NEEDLES) 31G X 6 MM MISC 1 application by Does not apply route at bedtime. 100 each 2  . Lancet Device MISC Check blood glucose 4 time daily (before meals and at bed time). E11.29. One touch Veiro meter. (Patient taking differently: Check blood glucose 3 time daily (before meals and at bed time). E11.29. One touch Veiro meter.) 100 each 12  . levothyroxine (SYNTHROID) 300 MCG tablet TAKE 1 TABLET BY MOUTH EVERY DAY BEFORE BREAKFAST 90 tablet 0  . Multiple Vitamins-Minerals  (MULTIVITAL PO) Take by mouth as needed.     . Semaglutide (RYBELSUS) 7 MG TABS Take 1 tablet by mouth daily before breakfast. Take 30 minutes before breakfast with water 30 tablet 3  . albuterol (VENTOLIN HFA) 108 (90 Base) MCG/ACT inhaler Inhale 1-2 puffs into the lungs every 6 (six) hours as needed for wheezing or shortness of breath. (Patient not taking: Reported on 12/15/2018) 6.7 g 0   No current facility-administered medications on file prior to visit.     Past Medical History:  Diagnosis Date  . Abnormal Pap smear early 2000's   had colposcopy, no additional treatment. normal paps since  . Chronic kidney disease 2018   one kidney  . Diabetes mellitus without complication ([place])   . Glaucoma    no eye drops  . History of chickenpox   . Hypothyroidism 1993  . Multiple thyroid nodules   . Pyelonephritis 1993   when pregnant; subsequently found to have shrunken, nonfunctioning kidney on one side  . Unilateral renal atrophy     Past Surgical History:  Procedure Laterality Date  . DILITATION & CURRETTAGE/HYSTROSCOPY WITH NOVASURE ABLATION N/A 03/02/2013   Procedure: DILATATION & CURETTAGE/HYSTEROSCOPY WITH NOVASURE ABLATION;  Surgeon: Olga Millers, MD;  Location: [facility];  Service: Gynecology;  Laterality: N/A;    Social History  Socioeconomic History  . Marital status: Married    Spouse name: Dub Mikes  . Number of children: 3  . Years of education: 32  . Highest education level: Not on file  Occupational History  . Occupation: Freight forwarder for [employer]  Social Needs  . Financial resource strain: Not on file  . Food insecurity    Worry: Not on file    Inability: Not on file  . Transportation needs    Medical: Not on file    Non-medical: Not on file  Tobacco Use  . Smoking status: Never Smoker  . Smokeless tobacco: Never Used  Substance and Sexual Activity  . Alcohol use: Yes    Comment: one drink every three months.  . Drug use: No  . Sexual activity: Yes    Partners:  Male    Birth control/protection: None  Lifestyle  . Physical activity    Days per week: Not on file    Minutes per session: Not on file  . Stress: Not on file  Relationships  . Social Herbalist on phone: Not on file    Gets together: Not on file    Attends religious service: Not on file    Active member of club or organization: Not on file    Attends meetings of clubs or organizations: Not on file    Relationship status: Not on file  Other Topics Concern  . Not on file  Social History Narrative   Lives with husband   Son is a police office in [place], has 1 grandchild.     Daughter is a Presenter, broadcasting in [place].     She moved here in 2005 from [place]   Caffeine use: none    Family History  Problem Relation Age of Onset  . Asthma Brother   . Diabetes Brother   . Diabetes Maternal Aunt   . Diabetes Maternal Uncle   . Diabetes Maternal Grandmother   . Cancer Maternal Grandfather        prostate cancer  . Colon cancer Neg Hx   . Rectal cancer Neg Hx   . Stomach cancer Neg Hx        Review of Systems  Constitutional: Negative for fever and weight loss.  Gastrointestinal: Positive for abdominal pain, bloating and constipation. Negative for anorexia, diarrhea, flatus, hematochezia, hemorrhoids, melena, nausea, rectal pain and vomiting.  Genitourinary: Negative for difficulty urinating.  Musculoskeletal: Negative for back pain.  Neurological: Negative for tingling and numbness.   Objective:   Vitals:   12/15/18 1400  BP: 130/86  Pulse: 65  Temp: (!) 96.7 F (35.9 C)  SpO2: 98%   Body mass index is 40.6 kg/m.  Physical Examination:  Physical Exam Constitutional:      Appearance: She is obese.  HENT:     Right Ear: Tympanic membrane, ear canal and external ear normal.     Left Ear: Tympanic membrane, ear canal and external ear normal.  Eyes:     Extraocular Movements: Extraocular movements intact.     Conjunctiva/sclera: Conjunctivae normal.   Neck:     Musculoskeletal: Normal range of motion and neck supple.  Cardiovascular:     Rate and Rhythm: Normal rate and regular rhythm.     Pulses: Normal pulses.     Heart sounds: Normal heart sounds.  Pulmonary:     Effort: Pulmonary effort is normal.     Breath sounds: Normal breath sounds.  Abdominal:     General: Bowel sounds  are normal. There is no distension.     Palpations: Abdomen is soft. There is mass.     Tenderness: There is abdominal tenderness in the left upper quadrant. There is no right CVA tenderness, left CVA tenderness or guarding.    Genitourinary:    Comments: Breast and pelvic exam deferred to GYN per patient Musculoskeletal: Normal range of motion.        General: Tenderness present. No swelling.     Right knee: Normal.     Left knee: Normal.     Right ankle: Normal. Achilles tendon normal.     Left ankle: She exhibits normal range of motion, no swelling, no deformity and normal pulse. Tenderness. CF ligament and posterior TFL tenderness found. No lateral malleolus, no medial malleolus, no AITFL, no head of 5th metatarsal and no proximal fibula tenderness found. Achilles tendon normal.     Right lower leg: No edema.     Left lower leg: No edema.     Right foot: Normal.     Left foot: Normal.  Lymphadenopathy:     Cervical: No cervical adenopathy.  Skin:    General: Skin is warm and dry.  Neurological:     Mental Status: She is alert and oriented to person, place, and time.  Psychiatric:        Mood and Affect: Mood normal.        Behavior: Behavior normal.        Thought Content: Thought content normal.    ASSESSMENT and PLAN:  Arha was seen today for annual exam.  Diagnoses and all orders for this visit:  Encounter for preventative adult health care exam with abnormal findings  Type 2 diabetes mellitus with microalbuminuria, without long-term current use of insulin (HCC) -     Microalbumin / creatinine urine ratio  Left upper quadrant  pain -     US Abdomen Limited; Future  Left lateral ankle pain  Chronic idiopathic constipation  Mixed hyperlipidemia -     Lipid panel -     rosuvastatin (CRESTOR) 20 MG tablet; Take 1 tablet (20 mg total) by mouth daily.  Vitamin D deficiency -     Cancel: Vitamin D 1,25 dihydroxy -     VITAMIN D 25 Hydroxy (Vit-D Deficiency, Fractures) -     Vitamin D, Ergocalciferol, (DRISDOL) 1.25 MG (50000 UT) CAPS capsule; Take 1 capsule (50,000 Units total) by mouth every 7 (seven) days.  Other orders -     LDL cholesterol, direct   Chronic idiopathic constipation Last colonoscopy 01/2018: colon polyps removed, benign pathology.  Advised to try docusate and/or magnesium OTC. Also advised about need to adequate oral hydration, high fiber diet and regular exercise.  Type 2 diabetes mellitus with microalbuminuria, without long-term current use of insulin (HCC) Under the care of endocrinology.  Improved hgbA1c of 6.8 Improved urine microalbumin without use of ACE/ARB Elevated LDL: start crestor. Normal foot exam uptodate with eye exam. She declined pneumonia and flu vaccine She is to schedule dental exam.  Mixed hyperlipidemia Start crestor Lipid Panel     Component Value Date/Time   CHOL 226 (H) 12/15/2018 1438   TRIG 211.0 (H) 12/15/2018 1438   HDL 34.10 (L) 12/15/2018 1438   CHOLHDL 7 12/15/2018 1438   VLDL 42.2 (H) 12/15/2018 1438   LDLCALC 125 (H) 02/26/2018 1050   LDLDIRECT 125.0 12/15/2018 1438      Problem List Items Addressed This Visit      Digestive   Chronic  idiopathic constipation    Last colonoscopy 01/2018: colon polyps removed, benign pathology.  Advised to try docusate and/or magnesium OTC. Also advised about need to adequate oral hydration, high fiber diet and regular exercise.        Endocrine   Type 2 diabetes mellitus with microalbuminuria, without long-term current use of insulin ([place])    Under the care of endocrinology.  Improved hgbA1c of  6.8 Improved urine microalbumin without use of ACE/ARB Elevated LDL: start crestor. Normal foot exam uptodate with eye exam. She declined pneumonia and flu vaccine She is to schedule dental exam.      Relevant Medications   rosuvastatin (CRESTOR) 20 MG tablet   Other Relevant Orders   Microalbumin / creatinine urine ratio (Completed)     Other   Mixed hyperlipidemia    Start crestor Lipid Panel     Component Value Date/Time   CHOL 226 (H) 12/15/2018 1438   TRIG 211.0 (H) 12/15/2018 1438   HDL 34.10 (L) 12/15/2018 1438   CHOLHDL 7 12/15/2018 1438   VLDL 42.2 (H) 12/15/2018 1438   LDLCALC 125 (H) 02/26/2018 1050   LDLDIRECT 125.0 12/15/2018 1438        Relevant Medications   rosuvastatin (CRESTOR) 20 MG tablet   Other Relevant Orders   Lipid panel (Completed)   Vitamin D deficiency   Relevant Medications   Vitamin D, Ergocalciferol, (DRISDOL) 1.25 MG (50000 UT) CAPS capsule   Other Relevant Orders   VITAMIN D 25 Hydroxy (Vit-D Deficiency, Fractures) (Completed)    Other Visit Diagnoses    Encounter for preventative adult health care exam with abnormal findings    -  Primary   Left upper quadrant pain       Relevant Orders   US Abdomen Limited   Left lateral ankle pain          Follow up: Return in 3 months (on 03/16/2019) for hyperlipidemia and Vitamin D (fasting).  Wilfred Lacy, NP

## 2018-12-15 NOTE — Patient Instructions (Addendum)
Improved urine microalbumin Very low vitamin D. Sent high dose vitamin D Rx. Abnormal lipid panel: elevated TC, LDL, and triglyceride. This increases your risk for cardiovascular disease. Need to start crestor. New rx sent.  We will obtain last eye exam from [facility] eye care. Schedule dental cleaning every 80months. Start daily exercise regimen (30-70mins, walking or video aerobics). For constipation: maintain adequate oral hydration, and high fiber diet. You can also try docusate 100mg  BID or magnesium oxide 250mg  daily.  You will be contacted to schedule appt for ABD [place]. Return to office for repeat CXR to ensure resolution of pneumonia  Use soft ankle brace on left ankle at bedtime. And cold compress as need  Health Maintenance, Female Adopting a healthy lifestyle and getting preventive care are important in promoting health and wellness. Ask your health care provider about:  The right schedule for you to have regular tests and exams.  Things you can do on your own to prevent diseases and keep yourself healthy. What should I know about diet, weight, and exercise? Eat a healthy diet   Eat a diet that includes plenty of vegetables, fruits, low-fat dairy products, and lean protein.  Do not eat a lot of foods that are high in solid fats, added sugars, or sodium. Maintain a healthy weight Body mass index (BMI) is used to identify weight problems. It estimates body fat based on height and weight. Your health care provider can help determine your BMI and help you achieve or maintain a healthy weight. Get regular exercise Get regular exercise. This is one of the most important things you can do for your health. Most adults should:  Exercise for at least 150 minutes each week. The exercise should increase your heart rate and make you sweat (moderate-intensity exercise).  Do strengthening exercises at least twice a week. This is in addition to the moderate-intensity exercise.  Spend less time  sitting. Even light physical activity can be beneficial. Watch cholesterol and blood lipids Have your blood tested for lipids and cholesterol at 51 years of age, then have this test every 5 years. Have your cholesterol levels checked more often if:  Your lipid or cholesterol levels are high.  You are older than 51 years of age.  You are at high risk for heart disease. What should I know about cancer screening? Depending on your health history and family history, you may need to have cancer screening at various ages. This may include screening for:  Breast cancer.  Cervical cancer.  Colorectal cancer.  Skin cancer.  Lung cancer. What should I know about heart disease, diabetes, and high blood pressure? Blood pressure and heart disease  High blood pressure causes heart disease and increases the risk of stroke. This is more likely to develop in people who have high blood pressure readings, are of African descent, or are overweight.  Have your blood pressure checked: ? Every 3-5 years if you are 73-95 years of age. ? Every year if you are 51 years old or older. Diabetes Have regular diabetes screenings. This checks your fasting blood sugar level. Have the screening done:  Once every three years after age 51 if you are at a normal weight and have a low risk for diabetes.  More often and at a younger age if you are overweight or have a high risk for diabetes. What should I know about preventing infection? Hepatitis B If you have a higher risk for hepatitis B, you should be screened for this virus.  Talk with your health care provider to find out if you are at risk for hepatitis B infection. Hepatitis C Testing is recommended for:  Everyone born from [birth year] through 1963.  Anyone with known risk factors for hepatitis C. Sexually transmitted infections (STIs)  Get screened for STIs, including gonorrhea and chlamydia, if: ? You are sexually active and are younger than 51 years of  age. ? You are older than 51 years of age and your health care provider tells you that you are at risk for this type of infection. ? Your sexual activity has changed since you were last screened, and you are at increased risk for chlamydia or gonorrhea. Ask your health care provider if you are at risk.  Ask your health care provider about whether you are at high risk for HIV. Your health care provider may recommend a prescription medicine to help prevent HIV infection. If you choose to take medicine to prevent HIV, you should first get tested for HIV. You should then be tested every 3 months for as long as you are taking the medicine. Pregnancy  If you are about to stop having your period (premenopausal) and you may become pregnant, seek counseling before you get pregnant.  Take 400 to 800 micrograms (mcg) of folic acid every day if you become pregnant.  Ask for birth control (contraception) if you want to prevent pregnancy. Osteoporosis and menopause Osteoporosis is a disease in which the bones lose minerals and strength with aging. This can result in bone fractures. If you are 51 years old or older, or if you are at risk for osteoporosis and fractures, ask your health care provider if you should:  Be screened for bone loss.  Take a calcium or vitamin D supplement to lower your risk of fractures.  Be given hormone replacement therapy (HRT) to treat symptoms of menopause. Follow these instructions at home: Lifestyle  Do not use any products that contain nicotine or tobacco, such as cigarettes, e-cigarettes, and chewing tobacco. If you need help quitting, ask your health care provider.  Do not use street drugs.  Do not share needles.  Ask your health care provider for help if you need support or information about quitting drugs. Alcohol use  Do not drink alcohol if: ? Your health care provider tells you not to drink. ? You are pregnant, may be pregnant, or are planning to become  pregnant.  If you drink alcohol: ? Limit how much you use to 0-1 drink a day. ? Limit intake if you are breastfeeding.  Be aware of how much alcohol is in your drink. In the [place] one drink equals one 12 oz bottle of beer (355 mL), one 5 oz glass of wine (148 mL), or one 1 oz glass of hard liquor (44 mL). General instructions  Schedule regular health, dental, and eye exams.  Stay current with your vaccines.  Tell your health care provider if: ? You often feel depressed. ? You have ever been abused or do not feel safe at home. Summary  Adopting a healthy lifestyle and getting preventive care are important in promoting health and wellness.  Follow your health care provider's instructions about healthy diet, exercising, and getting tested or screened for diseases.  Follow your health care provider's instructions on monitoring your cholesterol and blood pressure. This information is not intended to replace advice given to you by your health care provider. Make sure you discuss any questions you have with your health care provider. Document Released:  09/23/2010 Document Revised: 03/03/2018 Document Reviewed: 03/03/2018 Elsevier Patient Education  [place].

## 2018-12-15 NOTE — Assessment & Plan Note (Signed)
Last colonoscopy 01/2018: colon polyps removed, benign pathology.  Advised to try docusate and/or magnesium OTC. Also advised about need to adequate oral hydration, high fiber diet and regular exercise.

## 2018-12-16 ENCOUNTER — Encounter: Payer: Self-pay | Admitting: Nurse Practitioner

## 2018-12-16 ENCOUNTER — Other Ambulatory Visit: Payer: BC Managed Care – PPO

## 2018-12-16 MED ORDER — VITAMIN D (ERGOCALCIFEROL) 1.25 MG (50000 UNIT) PO CAPS: 50000.0000 [IU] | ORAL_CAPSULE | ORAL | 0 refills | Status: DC

## 2018-12-16 MED ORDER — ROSUVASTATIN CALCIUM 20 MG PO TABS: 20.0000 mg | ORAL_TABLET | Freq: Every day | ORAL | 1 refills | Status: DC

## 2018-12-16 NOTE — Assessment & Plan Note (Addendum)
Under the care of endocrinology.  Improved hgbA1c of 6.8 Improved urine microalbumin without use of ACE/ARB Elevated LDL: start crestor. Normal foot exam uptodate with eye exam. She declined pneumonia and flu vaccine She is to schedule dental exam.

## 2018-12-16 NOTE — Assessment & Plan Note (Signed)
Start crestor Lipid Panel     Component Value Date/Time   CHOL 226 (H) 12/15/2018 1438   TRIG 211.0 (H) 12/15/2018 1438   HDL 34.10 (L) 12/15/2018 1438   CHOLHDL 7 12/15/2018 1438   VLDL 42.2 (H) 12/15/2018 1438   LDLCALC 125 (H) 02/26/2018 1050   LDLDIRECT 125.0 12/15/2018 1438

## 2018-12-23 ENCOUNTER — Ambulatory Visit
Admission: RE | Admit: 2018-12-23 | Discharge: 2018-12-23 | Disposition: A | Discharge status: Home / Self Care | Payer: BC Managed Care – PPO | Source: Ambulatory Visit | Attending: Nurse Practitioner | Admitting: Nurse Practitioner

## 2018-12-23 DIAGNOSIS — R1012 Left upper quadrant pain: Secondary | ICD-10-CM

## 2019-02-04 ENCOUNTER — Other Ambulatory Visit: Payer: Self-pay

## 2019-02-04 ENCOUNTER — Other Ambulatory Visit (INDEPENDENT_AMBULATORY_CARE_PROVIDER_SITE_OTHER): Payer: BC Managed Care – PPO

## 2019-02-04 DIAGNOSIS — E1165 Type 2 diabetes mellitus with hyperglycemia: Secondary | ICD-10-CM

## 2019-02-04 DIAGNOSIS — E039 Hypothyroidism, unspecified: Secondary | ICD-10-CM

## 2019-02-04 NOTE — Addendum Note (Signed)
Addended by: Kaylyn Lim I on: 02/04/2019 03:40 PM   Modules accepted: Orders

## 2019-02-05 LAB — BASIC METABOLIC PANEL
BUN/Creatinine Ratio: 12 (ref 9–23)
BUN: 11 mg/dL (ref 6–24)
CO2: 24 mmol/L (ref 20–29)
Calcium: 9.9 mg/dL (ref 8.7–10.2)
Chloride: 104 mmol/L (ref 96–106)
Creatinine, Ser: 0.95 mg/dL (ref 0.57–1.00)
GFR calc Af Amer: 80 mL/min/{1.73_m2} (ref >59)
GFR calc non Af Amer: 70 mL/min/{1.73_m2} (ref >59)
Glucose: 97 mg/dL (ref 65–99)
Potassium: 4.4 mmol/L (ref 3.5–5.2)
Sodium: 141 mmol/L (ref 134–144)

## 2019-02-05 LAB — HEMOGLOBIN A1C
Est. average glucose Bld gHb Est-mCnc: 146 mg/dL
Hgb A1c MFr Bld: 6.7 % — ABNORMAL HIGH (ref 4.8–5.6)

## 2019-02-05 LAB — TSH: TSH: 7.46 u[IU]/mL — ABNORMAL HIGH (ref 0.450–4.500)

## 2019-02-08 ENCOUNTER — Ambulatory Visit (INDEPENDENT_AMBULATORY_CARE_PROVIDER_SITE_OTHER): Payer: BC Managed Care – PPO | Admitting: Endocrinology

## 2019-02-08 ENCOUNTER — Encounter: Payer: Self-pay | Admitting: Endocrinology

## 2019-02-08 VITALS — BP 142/80 | HR 77 | Ht 68.0 in | Wt 272.2 lb

## 2019-02-08 DIAGNOSIS — E669 Obesity, unspecified: Secondary | ICD-10-CM | POA: Diagnosis not present

## 2019-02-08 DIAGNOSIS — E1169 Type 2 diabetes mellitus with other specified complication: Secondary | ICD-10-CM | POA: Diagnosis not present

## 2019-02-08 DIAGNOSIS — E039 Hypothyroidism, unspecified: Secondary | ICD-10-CM

## 2019-02-08 DIAGNOSIS — E7800 Pure hypercholesterolemia, unspecified: Secondary | ICD-10-CM | POA: Diagnosis not present

## 2019-02-08 NOTE — Patient Instructions (Signed)
Take 1 pill thyroid daily  Check blood sugars on waking up 1-2 days a week  Also check blood sugars about 2 hours after meals and do this after different meals by rotation  Recommended blood sugar levels on waking up are 90-130 and about 2 hours after meal is 130-160  Please bring your blood sugar monitor to each visit, thank you

## 2019-02-08 NOTE — Progress Notes (Signed)
Patient ID: Gail Barr, female   DOB: 1965-08-22, 53 y.o.   MRN: [id number]          Reason for Appointment: Endocrinology follow-up  Referring physician: Wilfred Lacy   History of Present Illness:          Date of diagnosis of type 2 diabetes mellitus:  2015    Background history:   She had an upper normal A1c of 6.5 in 2014 and then subsequently A1c was in the diabetic range Appears that her blood sugars have been poorly controlled with progressive rise in A1c Follow-up for diabetes has been infrequent and she has not had a consistent primary care provider also She had been treated with metformin initially Subsequently had been on Farxiga in 2016 and also Invokamet subsequently  In 2/19 she had marked hyperglycemia at the emergency room when seen for headache and blurred vision with glucose of 539.  Recent history:        Non-insulin hypoglycemic drugs the patient is taking are: Rybelsus 7 mg daily  A1c  is about the same at 6.7  Current management, blood sugar patterns and problems identified:  She has taking 7 mg Rybelsus since her visit in 11/2018  She is having somewhat better blood sugars compared to her last visit  Initially with Rybelsus she had nausea and vomiting for up to 3 days  It does appear to improve her satiety level and she is not eating much during the day except later in the evening  However surprisingly has not lost any weight  This may be from periodically snacking or eating more in the evening, previously was eating more peanuts  She only started doing some walking about 3 weeks ago  As before she is checking blood sugars very sporadically  However most of her blood sugars are looking excellent even when checked later in the day        Side effects from medications have been: Diarrhea, abdominal pain from metformin   Glucose monitoring:  As below       Glucometer: One Touch Verio.       Blood Glucose readings    Recent range 104-144  over the last 30 days with average 128  Previous readings: 132-147 with only 3 readings midday  Self-care: The diet that the patient has been following is: tries to limit high-fat foods.     Typical meal intake: Breakfast is eggs with spinach Lunch usually [place] salad and pita bread.  Dinner is rice or pasta dishes or a small snack               Dietician visit, most recent: 1/20  Weight history:  Wt Readings from Last 3 Encounters:  02/08/19 272 lb 3.2 oz (123.5 kg)  12/15/18 267 lb (121.1 kg)  12/13/18 269 lb 11.2 oz (122.3 kg)   Glycemic control:   Lab Results  Component Value Date   HGBA1C 6.7 (H) 02/04/2019   HGBA1C 6.8 (H) 11/12/2018   HGBA1C 7.4 (A) 07/28/2018   Lab Results  Component Value Date   MICROALBUR 6.2 (H) 12/15/2018   LDLCALC 125 (H) 02/26/2018   CREATININE 0.95 02/04/2019   Lab Results  Component Value Date   MICRALBCREAT 5.7 12/15/2018    Lab Results  Component Value Date   FRUCTOSAMINE 258 05/14/2018   FRUCTOSAMINE 277 07/09/2017    Problem 2:  HYPOTHYROIDISM  She was diagnosed to have hypothyroidism soon after her pregnancy around the year 1995 At that time she  had a swelling in her thyroid and no other significant symptoms She was started on thyroid supplements and has been on these and variable doses over the last several years More recently she has been taking mostly 200 mcg She thinks that when her thyroid levels are lower when she is not taking her levothyroxine regularly she only has some difficulty with remembering things and some thinning of her hair but usually no cold intolerance or unusual fatigue She also tends to have more constipation  Although she was initially started on 200 mcg on 04/24/2017 because of her TSH being higher 2 weeks later another 50 mcg was added  TSH was still high when she was taking separate prescriptions for 200 and 50 mcg tablets  Since 12/19 she has been taking 300 mcg of levothyroxine  She has been  again very regular with taking this every morning with water Although on her last visit her TSH was low normal it is now up to 7.5 with only reducing the dose by 150 mcg weekly She does now complain of more fatigue   GOITER: She has also had swelling in her thyroid for several years and her ultrasound shows only heterogenous tissue and no nodules She says that she was having some swelling and tenderness on the right thyroid area a few days ago but this is better now   Lab Results  Component Value Date   TSH 7.460 (H) 02/04/2019   TSH 0.47 11/12/2018   TSH 6.55 (H) 09/03/2018   FREET4 1.03 11/12/2018   FREET4 0.58 (L) 09/03/2018   FREET4 0.54 (L) 05/14/2018       Allergies as of 02/08/2019      Reactions   Betadine [povidone Iodine]    Sob, swelling   Ibuprofen Shortness Of Breath, Swelling, Anaphylaxis   And chest pain.  Tolerates naproxen without problems   Iodinated Diagnostic Agents Anaphylaxis   Iodine Anaphylaxis   Iohexol     Desc: Pt states [place] 5 [place] and experienced severe sob and swelling. We did her scan w/o iv contrast today.        Thanks., Onset Date: [id number]      Medication List       Accurate as of February 08, 2019  3:56 PM. If you have any questions, ask your nurse or doctor.        albuterol 108 (90 Base) MCG/ACT inhaler Commonly known as: VENTOLIN HFA Inhale 1-2 puffs into the lungs every 6 (six) hours as needed for wheezing or shortness of breath.   glucose blood test strip Check blood glucose 4 time daily (before meals and at bed time). E11.29. One touch Veiro meter. What changed: additional instructions   Lancet Device Misc Check blood glucose 4 time daily (before meals and at bed time). E11.29. One touch Veiro meter. What changed: additional instructions   levothyroxine 300 MCG tablet Commonly known as: SYNTHROID TAKE 1 TABLET BY MOUTH EVERY DAY BEFORE BREAKFAST   MULTIVITAL PO Take by mouth as needed.    Pen Needles 31G X 6 MM Misc 1 application by Does not apply route at bedtime.   B-D UF III MINI PEN NEEDLES 31G X 5 MM Misc Generic drug: Insulin Pen Needle USE AS DIRECTED AT BEDTIME   rosuvastatin 20 MG tablet Commonly known as: CRESTOR Take 1 tablet (20 mg total) by mouth daily.   Rybelsus 7 MG Tabs Generic drug: Semaglutide Take 1 tablet by mouth daily  before breakfast. Take 30 minutes before breakfast with water   Vitamin D (Ergocalciferol) 1.25 MG (50000 UT) Caps capsule Commonly known as: DRISDOL Take 1 capsule (50,000 Units total) by mouth every 7 (seven) days.       Allergies:  Allergies  Allergen Reactions  . Betadine [Povidone Iodine]     Sob, swelling  . Ibuprofen Shortness Of Breath, Swelling and Anaphylaxis    And chest pain.  Tolerates naproxen without problems  . Iodinated Diagnostic Agents Anaphylaxis  . Iodine Anaphylaxis  . Iohexol      Desc: Pt states she had a CT [place] approx 5 yrs ago and experienced severe sob and swelling. We did her scan w/o iv contrast today.        Thanks., Onset Date: [id number]     Past Medical History:  Diagnosis Date  . Abnormal Pap smear early 2000's   had colposcopy, no additional treatment. normal paps since  . Chronic kidney disease 2018   one kidney  . Diabetes mellitus without complication ([place])   . Glaucoma    no eye drops  . History of chickenpox   . Hypothyroidism 1993  . Multiple thyroid nodules   . Pyelonephritis 1993   when pregnant; subsequently found to have shrunken, nonfunctioning kidney on one side  . Unilateral renal atrophy     Past Surgical History:  Procedure Laterality Date  . DILITATION & CURRETTAGE/HYSTROSCOPY WITH NOVASURE ABLATION N/A 03/02/2013   Procedure: DILATATION & CURETTAGE/HYSTEROSCOPY WITH NOVASURE ABLATION;  Surgeon: Olga Millers, MD;  Location: [facility];  Service: Gynecology;  Laterality: N/A;    Family History  Problem Relation Age of Onset  . Asthma Brother   .  Diabetes Brother   . Diabetes Maternal Aunt   . Diabetes Maternal Uncle   . Diabetes Maternal Grandmother   . Cancer Maternal Grandfather        prostate cancer  . Colon cancer Neg Hx   . Rectal cancer Neg Hx   . Stomach cancer Neg Hx     Social History:  reports that she has never smoked. She has never used smokeless tobacco. She reports current alcohol use. She reports that she does not use drugs.   Review of Systems   Lipid history: Her baseline LDL has been high, last 125 when she was told to take pravastatin However she she was having nausea and stopped it  Her lipids are recently worse, addressed by her PCP  Currently not on any statin drug even though her PCP recommended starting Crestor 20 mg which she has not filled  She has seen the dietitian in 2018 for meal planning    Lab Results  Component Value Date   CHOL 226 (H) 12/15/2018   CHOL 175 05/14/2018   CHOL 181 02/26/2018   Lab Results  Component Value Date   HDL 34.10 (L) 12/15/2018   HDL 32.80 (L) 05/14/2018   HDL 37.10 (L) 02/26/2018   Lab Results  Component Value Date   LDLCALC 125 (H) 02/26/2018   LDLCALC 133 (H) 08/20/2017   LDLCALC 100 04/23/2015   Lab Results  Component Value Date   TRIG 211.0 (H) 12/15/2018   TRIG 213.0 (H) 05/14/2018   TRIG 93.0 02/26/2018   Lab Results  Component Value Date   CHOLHDL 7 12/15/2018   CHOLHDL 5 05/14/2018   CHOLHDL 5 02/26/2018   Lab Results  Component Value Date   LDLDIRECT 125.0 12/15/2018   LDLDIRECT 102.0 05/14/2018  Hypertension: Blood pressure is variable, currently not on medications, had been on ramipril previously  BP Readings from Last 3 Encounters:  02/08/19 (!) 142/80  12/15/18 130/86  12/13/18 130/80    Most recent eye exam was in 3/19  Most recent foot exam:4/19    LABS:  Lab on 02/04/2019  Component Date Value Ref Range Status  . Hgb A1c MFr Bld 02/04/2019 6.7* 4.8 - 5.6 % Final   Comment:           Prediabetes: 5.7 - 6.4          Diabetes: >6.4          Glycemic control for adults with diabetes: <7.0   . Est. average glucose Bld gHb Est-m* 02/04/2019 146  mg/dL Final  . Glucose 02/04/2019 97  65 - 99 mg/dL Final  . BUN 02/04/2019 11  6 - 24 mg/dL Final  . Creatinine, Ser 02/04/2019 0.95  0.57 - 1.00 mg/dL Final  . GFR calc non Af Amer 02/04/2019 70  >59 mL/min/1.73 Final  . GFR calc Af Amer 02/04/2019 80  >59 mL/min/1.73 Final  . BUN/Creatinine Ratio 02/04/2019 12  9 - 23 Final  . Sodium 02/04/2019 141  134 - 144 mmol/L Final  . Potassium 02/04/2019 4.4  3.5 - 5.2 mmol/L Final  . Chloride 02/04/2019 104  96 - 106 mmol/L Final  . CO2 02/04/2019 24  20 - 29 mmol/L Final  . Calcium 02/04/2019 9.9  8.7 - 10.2 mg/dL Final  . TSH 02/04/2019 7.460* 0.450 - 4.500 uIU/mL Final    Physical Examination:  BP (!) 142/80 (BP Location: Left Arm, Patient Position: Sitting, Cuff Size: Large)   Pulse 77   Ht 5\' 8"  (1.727 m)   Wt 272 lb 3.2 oz (123.5 kg)   SpO2 98%   BMI 41.39 kg/m      ASSESSMENT:  Diabetes type 2, current BMI 41  See history of present illness for detailed discussion of current diabetes management, blood sugar patterns and problems identified  A1c is 6.7  She is having excellent blood sugars with 7 mg Rybelsus which she is tolerating She does tend to have decreased appetite from this especially during the day but may occasionally tend to overeat in the evenings Starting to walk again and hopefully can start losing weight with more walking and more consistent diet  HYPOTHYROIDISM: She has been on 300 mcg of levothyroxine Although TSH was low normal previously with reducing the dosage by half tablet weekly her TSH is now above normal She thinks she is more fatigued because of this She thinks she is regular with taking a supplement now  LIPIDS: She has had hypercholesterolemia that needs treatment Discussed that her PCP had prescribed Crestor and she needs to go  and fill the prescription    PLAN:    Continue Rybelsus 7 mg daily  She will take 7  tablets a week of the levothyroxine  Start regular monitoring of blood sugars including after meals  Follow-up in 3 months for repeat A1c  Start Crestor as prescribed but she can start with half tablet of the 20 mg to make sure she can tolerate it  There are no Patient Instructions on file for this visit.       Elayne Snare 02/08/2019, 3:56 PM   Note: This office note was prepared with Dragon voice recognition system technology. Any transcriptional errors that result from this process are unintentional.

## 2019-04-08 ENCOUNTER — Telehealth: Payer: Self-pay | Admitting: Nurse Practitioner

## 2019-04-08 NOTE — Telephone Encounter (Signed)
Pt called stating she wants to see if we can initiate the PAR-q and SF--36 forms again.   Gail Barr please help check, this mention in 06/18/2018 ov note.

## 2019-04-10 NOTE — Telephone Encounter (Signed)
This is about weight loss management. Please have her schedule a video appt

## 2019-04-11 NOTE — Telephone Encounter (Signed)
Admin team please help call the pt and offer an appt with [facility] to consult about this request.

## 2019-04-13 NOTE — Telephone Encounter (Signed)
Scheduled appt.

## 2019-04-14 ENCOUNTER — Telehealth (INDEPENDENT_AMBULATORY_CARE_PROVIDER_SITE_OTHER): Payer: BC Managed Care – PPO | Admitting: Nurse Practitioner

## 2019-04-14 ENCOUNTER — Other Ambulatory Visit: Payer: Self-pay

## 2019-04-14 ENCOUNTER — Encounter: Payer: Self-pay | Admitting: Nurse Practitioner

## 2019-04-14 DIAGNOSIS — R0600 Dyspnea, unspecified: Secondary | ICD-10-CM | POA: Diagnosis not present

## 2019-04-14 DIAGNOSIS — R42 Dizziness and giddiness: Secondary | ICD-10-CM

## 2019-04-14 DIAGNOSIS — R0609 Other forms of dyspnea: Secondary | ICD-10-CM

## 2019-04-14 NOTE — Progress Notes (Signed)
Virtual Visit via Video Note  I connected with@ on 04/14/19 at  1:30 PM EST by a video enabled telemedicine application and verified that I am speaking with the correct person using two identifiers.  Location: Patient:Home Provider: Office Participants: patient and provider   I discussed the limitations of evaluation and management by telemedicine and the availability of in person appointments. I also discussed with the patient that there may be a patient responsible charge related to this service. The patient expressed understanding and agreed to proceed.  CC: chronic SOB with exertion, fitness to joint high intensity exercise program  History of Present Illness: Shortness of Breath This is a chronic problem. The current episode started more than 1 year ago. The problem occurs intermittently. The problem has been unchanged. Pertinent negatives include no chest pain, headaches, leg swelling, orthopnea, PND, syncope or wheezing. The symptoms are aggravated by exercise. She has tried rest for the symptoms. The treatment provided significant relief. Her past medical history is significant for allergies and asthma. There is no history of aspirin allergies, bronchiolitis, CAD, COPD, a heart failure, PE or a recent surgery.  weight loss goal 170lbs Started walking 35mile a day with some SOB and dizziness. She wants to start Boot camp/high intensity exercise, but is concerned about cardiac health. Previous nuclear stress test done 2015: normal Work schedule does not allow time to schedule appt with weight loss clinic.   DM is controlled. BP at goal No FHx of HCM or sudden cardiac death. Last ECG 11-28-18: S. Tachycardia with bil. Fascicular block.  Observations/Objective: Physical Exam  Constitutional: She is oriented to person, place, and time.  Pulmonary/Chest: Effort normal.  Neurological: She is alert and oriented to person, place, and time.   Assessment and Plan: Mistee was seen today  for follow-up.  Diagnoses and all orders for this visit:  Dyspnea on exertion -     Exercise Tolerance Test; Future  Morbid obesity (HCC) -     Exercise Tolerance Test; Future  Dizziness -     Exercise Tolerance Test; Future   Follow Up Instructions: See avs   I discussed the assessment and treatment plan with the patient. The patient was provided an opportunity to ask questions and all were answered. The patient agreed with the plan and demonstrated an understanding of the instructions.   The patient was advised to call back or seek an in-person evaluation if the symptoms worsen or if the condition fails to improve as anticipated.   Wilfred Lacy, NP

## 2019-04-14 NOTE — Patient Instructions (Signed)
Continue walking daily and DASH diet. You will be contacted to schedule appt for treadmill stress test

## 2019-04-28 ENCOUNTER — Other Ambulatory Visit (INDEPENDENT_AMBULATORY_CARE_PROVIDER_SITE_OTHER): Payer: BC Managed Care – PPO

## 2019-04-28 ENCOUNTER — Other Ambulatory Visit: Payer: Self-pay

## 2019-04-28 DIAGNOSIS — E7800 Pure hypercholesterolemia, unspecified: Secondary | ICD-10-CM | POA: Diagnosis not present

## 2019-04-28 DIAGNOSIS — E1169 Type 2 diabetes mellitus with other specified complication: Secondary | ICD-10-CM | POA: Diagnosis not present

## 2019-04-28 DIAGNOSIS — E039 Hypothyroidism, unspecified: Secondary | ICD-10-CM | POA: Diagnosis not present

## 2019-04-28 DIAGNOSIS — E669 Obesity, unspecified: Secondary | ICD-10-CM

## 2019-04-28 LAB — COMPREHENSIVE METABOLIC PANEL
ALT: 11 U/L (ref 0–35)
AST: 13 U/L (ref 0–37)
Albumin: 4 g/dL (ref 3.5–5.2)
Alkaline Phosphatase: 53 U/L (ref 39–117)
BUN: 13 mg/dL (ref 6–23)
CO2: 26 mEq/L (ref 19–32)
Calcium: 9.4 mg/dL (ref 8.4–10.5)
Chloride: 106 mEq/L (ref 96–112)
Creatinine, Ser: 0.99 mg/dL (ref 0.40–1.20)
GFR: 71.38 mL/min (ref >60.00)
Glucose, Bld: 145 mg/dL — ABNORMAL HIGH (ref 70–99)
Potassium: 4 mEq/L (ref 3.5–5.1)
Sodium: 138 mEq/L (ref 135–145)
Total Bilirubin: 0.4 mg/dL (ref 0.2–1.2)
Total Protein: 7.5 g/dL (ref 6.0–8.3)

## 2019-04-28 LAB — LIPID PANEL
Cholesterol: 180 mg/dL (ref 0–200)
HDL: 35.7 mg/dL — ABNORMAL LOW (ref >39.00)
LDL Cholesterol: 121 mg/dL — ABNORMAL HIGH (ref 0–99)
NonHDL: 144.35
Total CHOL/HDL Ratio: 5
Triglycerides: 115 mg/dL (ref 0.0–149.0)
VLDL: 23 mg/dL (ref 0.0–40.0)

## 2019-04-28 LAB — TSH: TSH: 7.69 u[IU]/mL — ABNORMAL HIGH (ref 0.35–4.50)

## 2019-04-28 LAB — T4, FREE: Free T4: 0.46 ng/dL — ABNORMAL LOW (ref 0.60–1.60)

## 2019-04-28 LAB — HEMOGLOBIN A1C: Hgb A1c MFr Bld: 6.5 % (ref 4.6–6.5)

## 2019-05-04 ENCOUNTER — Ambulatory Visit: Payer: BC Managed Care – PPO | Admitting: Endocrinology

## 2019-05-11 ENCOUNTER — Ambulatory Visit (INDEPENDENT_AMBULATORY_CARE_PROVIDER_SITE_OTHER): Payer: BC Managed Care – PPO | Admitting: Endocrinology

## 2019-05-11 ENCOUNTER — Other Ambulatory Visit: Payer: Self-pay

## 2019-05-11 ENCOUNTER — Encounter: Payer: Self-pay | Admitting: Endocrinology

## 2019-05-11 VITALS — BP 130/78 | HR 99 | Ht 68.0 in | Wt 272.4 lb

## 2019-05-11 DIAGNOSIS — E1165 Type 2 diabetes mellitus with hyperglycemia: Secondary | ICD-10-CM | POA: Diagnosis not present

## 2019-05-11 DIAGNOSIS — E063 Autoimmune thyroiditis: Secondary | ICD-10-CM

## 2019-05-11 DIAGNOSIS — E669 Obesity, unspecified: Secondary | ICD-10-CM

## 2019-05-11 DIAGNOSIS — E1169 Type 2 diabetes mellitus with other specified complication: Secondary | ICD-10-CM

## 2019-05-11 DIAGNOSIS — E7800 Pure hypercholesterolemia, unspecified: Secondary | ICD-10-CM

## 2019-05-11 NOTE — Patient Instructions (Addendum)
Thyroid pill 300mg  3/7 and 150ug on 4/7 days  Take Rybelsus with water  Check blood sugars on waking up days a week  Also check blood sugars about 2 hours after meals and do this after different meals by rotation  Recommended blood sugar levels on waking up are 90-130 and about 2 hours after meal is 130-160  Please bring your blood sugar monitor to each visit, thank you

## 2019-05-11 NOTE — Progress Notes (Signed)
Patient ID: Gail Barr, female   DOB: Oct 03, 1965, 54 y.o.   MRN: [id number]          Reason for Appointment: Endocrinology follow-up  Referring physician: Wilfred Lacy   History of Present Illness:          Date of diagnosis of type 2 diabetes mellitus:  2015    Background history:   She had an upper normal A1c of 6.5 in 2014 and then subsequently A1c was in the diabetic range Appears that her blood sugars have been poorly controlled with progressive rise in A1c Follow-up for diabetes has been infrequent and she has not had a consistent primary care provider also She had been treated with metformin initially Subsequently had been on Farxiga in 2016 and also Invokamet subsequently  In 2/19 she had marked hyperglycemia at the emergency room when seen for headache and blurred vision with glucose of 539.  Recent history:        Non-insulin hypoglycemic drugs the patient is taking are: Rybelsus 7 mg daily  A1c  is slightly better at 6.5  Current management, blood sugar patterns and problems identified:  She has taking 7 mg Rybelsus since her visit in 11/2018  She is checking blood sugars mostly before her first meal and these are variable  Blood sugars in the evenings are checked infrequently and do not appear to be high  Unclear whether her readings especially in the evenings are after meals  Despite taking 7 mg of Rybelsus consistently she has not lost weight  In the last couple of weeks she said that she was having abdominal discomfort after taking her Rybelsus with water and she started taking it with unsweetened tea  However morning sugars appear to be mostly high in the last week with this  She had previously tried Ozempic but this was not covered by her insurance  Her exercise has been inconsistent but is planning to start back again  Lab fasting glucose was 145 about 10 days ago        Side effects from medications have been: Diarrhea, abdominal pain from  metformin   Glucose monitoring:  As below       Glucometer: One Touch Verio.       Blood Glucose readings    PRE-MEAL Fasting Lunch Dinner  evening Overall  Glucose range:  139-229   148, 159  86-177   Mean/median:  160    152  153   PREVIOUSLY: 104-144 over  30 days with average 128  Self-care: The diet that the patient has been following is: tries to limit high-fat foods.     Typical meal intake: Breakfast is eggs with spinach Lunch usually [place] salad and pita bread.  Dinner is rice or pasta dishes or a small snack               Dietician visit, most recent: 1/20  Weight history:  Wt Readings from Last 3 Encounters:  05/11/19 272 lb 6.4 oz (123.6 kg)  02/08/19 272 lb 3.2 oz (123.5 kg)  12/15/18 267 lb (121.1 kg)   Glycemic control:   Lab Results  Component Value Date   HGBA1C 6.5 04/28/2019   HGBA1C 6.7 (H) 02/04/2019   HGBA1C 6.8 (H) 11/12/2018   Lab Results  Component Value Date   MICROALBUR 6.2 (H) 12/15/2018   LDLCALC 121 (H) 04/28/2019   CREATININE 0.99 04/28/2019   Lab Results  Component Value Date   MICRALBCREAT 5.7 12/15/2018    Lab  Results  Component Value Date   FRUCTOSAMINE 258 05/14/2018   FRUCTOSAMINE 277 07/09/2017    Problem 2:  HYPOTHYROIDISM  She was diagnosed to have hypothyroidism soon after her pregnancy around the year 1995 At that time she had a swelling in her thyroid and no other significant symptoms She was started on thyroid supplements and has been on these and variable doses over the last several years More recently she has been taking mostly 200 mcg She thinks that when her thyroid levels are lower when she is not taking her levothyroxine regularly she only has some difficulty with remembering things and some thinning of her hair but usually no cold intolerance or unusual fatigue She also tends to have more constipation  Although she was initially started on 200 mcg on 04/24/2017 because of her TSH being higher 2 weeks later  another 50 mcg was added  TSH was still high when she was taking separate prescriptions for 200 and 50 mcg tablets  Since 12/19 she has been prescribed a 300 mcg tablet of levothyroxine  She has been very regular with taking this every morning with water Although on her last visit her TSH was up to 7.5 with slightly reducing the dose by 150 mcg weekly She was told to take 1 pill daily on a regular basis However she forgot her instructions and got confused about how she needed to take her levothyroxine and is now apparently taking 1/2 tablet daily and 1 tablet only once a week Surprisingly her TSH is about the same  She does not complain of recent fatigue, weight is about the same No cold intolerance   GOITER: She has also had swelling in her thyroid for several years and her ultrasound shows only heterogenous tissue and no nodules    Lab Results  Component Value Date   TSH 7.69 (H) 04/28/2019   TSH 7.460 (H) 02/04/2019   TSH 0.47 11/12/2018   FREET4 0.46 (L) 04/28/2019   FREET4 1.03 11/12/2018   FREET4 0.58 (L) 09/03/2018       Allergies as of 05/11/2019      Reactions   Betadine [povidone Iodine]    Sob, swelling   Ibuprofen Shortness Of Breath, Swelling, Anaphylaxis   And chest pain.  Tolerates naproxen without problems   Iodinated Diagnostic Agents Anaphylaxis   Iodine Anaphylaxis   Iohexol     Desc: Pt states [place] 5 [place] and experienced severe sob and swelling. We did her scan w/o iv contrast today.        Thanks., Onset Date: [id number]      Medication List       Accurate as of May 11, 2019  9:00 PM. If you have any questions, ask your nurse or doctor.        glucose blood test strip Check blood glucose 4 time daily (before meals and at bed time). E11.29. One touch Veiro meter. What changed: additional instructions   Lancet Device Misc Check blood glucose 4 time daily (before meals and at bed time). E11.29. One touch Veiro  meter. What changed: additional instructions   levothyroxine 300 MCG tablet Commonly known as: SYNTHROID TAKE 1 TABLET BY MOUTH EVERY DAY BEFORE BREAKFAST   MULTIVITAL PO Take by mouth as needed.   Pen Needles 31G X 6 MM Misc 1 application by Does not apply route at bedtime.   B-D UF III MINI PEN NEEDLES 31G X 5 MM Misc Generic drug:  Insulin Pen Needle USE AS DIRECTED AT BEDTIME   rosuvastatin 20 MG tablet Commonly known as: CRESTOR Take 1 tablet (20 mg total) by mouth daily.   Rybelsus 7 MG Tabs Generic drug: Semaglutide Take 1 tablet by mouth daily before breakfast. Take 30 minutes before breakfast with water   Vitamin D (Ergocalciferol) 1.25 MG (50000 UNIT) Caps capsule Commonly known as: DRISDOL Take 1 capsule (50,000 Units total) by mouth every 7 (seven) days.       Allergies:  Allergies  Allergen Reactions  . Betadine [Povidone Iodine]     Sob, swelling  . Ibuprofen Shortness Of Breath, Swelling and Anaphylaxis    And chest pain.  Tolerates naproxen without problems  . Iodinated Diagnostic Agents Anaphylaxis  . Iodine Anaphylaxis  . Iohexol      Desc: Pt states she had a CT out of state approx 5 yrs ago and experienced severe sob and swelling. We did her scan w/o iv contrast today.        Thanks., Onset Date: [id number]     Past Medical History:  Diagnosis Date  . Abnormal Pap smear early 2000's   had colposcopy, no additional treatment. normal paps since  . Chronic kidney disease 2018   one kidney  . Diabetes mellitus without complication ([place])   . Glaucoma    no eye drops  . History of chickenpox   . Hypothyroidism 1993  . Multiple thyroid nodules   . Pyelonephritis 1993   when pregnant; subsequently found to have shrunken, nonfunctioning kidney on one side  . Unilateral renal atrophy     Past Surgical History:  Procedure Laterality Date  . DILITATION & CURRETTAGE/HYSTROSCOPY WITH NOVASURE ABLATION N/A 03/02/2013   Procedure: DILATATION &  CURETTAGE/HYSTEROSCOPY WITH NOVASURE ABLATION;  Surgeon: Olga Millers, MD;  Location: [facility];  Service: Gynecology;  Laterality: N/A;    Family History  Problem Relation Age of Onset  . Asthma Brother   . Diabetes Brother   . Diabetes Maternal Aunt   . Diabetes Maternal Uncle   . Diabetes Maternal Grandmother   . Cancer Maternal Grandfather        prostate cancer  . Colon cancer Neg Hx   . Rectal cancer Neg Hx   . Stomach cancer Neg Hx     Social History:  reports that she has never smoked. She has never used smokeless tobacco. She reports current alcohol use. She reports that she does not use drugs.   Review of Systems   Lipid history: Her baseline LDL has been high, last 125 when she was told to take pravastatin However she she was having nausea and stopped it  Her lipids are variable as follows  Lipids are now better with starting Crestor 20 mg which she was told to get the prescription for on her visit in 11/20  She has seen the dietitian in 2018 for meal planning    Lab Results  Component Value Date   CHOL 180 04/28/2019   CHOL 226 (H) 12/15/2018   CHOL 175 05/14/2018   Lab Results  Component Value Date   HDL 35.70 (L) 04/28/2019   HDL 34.10 (L) 12/15/2018   HDL 32.80 (L) 05/14/2018   Lab Results  Component Value Date   LDLCALC 121 (H) 04/28/2019   LDLCALC 125 (H) 02/26/2018   LDLCALC 133 (H) 08/20/2017   Lab Results  Component Value Date   TRIG 115.0 04/28/2019   TRIG 211.0 (H) 12/15/2018   TRIG 213.0 (H) 05/14/2018  Lab Results  Component Value Date   CHOLHDL 5 04/28/2019   CHOLHDL 7 12/15/2018   CHOLHDL 5 05/14/2018   Lab Results  Component Value Date   LDLDIRECT 125.0 12/15/2018   LDLDIRECT 102.0 05/14/2018            Hypertension: Blood pressure is better now Not on ramipril currently  BP Readings from Last 3 Encounters:  05/11/19 130/78  02/08/19 (!) 142/80  12/15/18 130/86    Most recent eye exam was in 3/19  Most recent  foot exam:4/19    LABS:  No visits with results within 1 Week(s) from this visit.  Latest known visit with results is:  Lab on 04/28/2019  Component Date Value Ref Range Status  . Free T4 04/28/2019 0.46* 0.60 - 1.60 ng/dL Final   Comment: Specimens from patients who are undergoing biotin therapy and /or ingesting biotin supplements may contain high levels of biotin.  The higher biotin concentration in these specimens interferes with this Free T4 assay.  Specimens that contain high levels  of biotin may cause false high results for this Free T4 assay.  Please interpret results in light of the total clinical presentation of the patient.    Marland Kitchen TSH 04/28/2019 7.69* 0.35 - 4.50 uIU/mL Final  . Cholesterol 04/28/2019 180  0 - 200 mg/dL Final   ATP III Classification       Desirable:  < 200 mg/dL               Borderline High:  200 - 239 mg/dL          High:  > = 240 mg/dL  . Triglycerides 04/28/2019 115.0  0.0 - 149.0 mg/dL Final   Normal:  <150 mg/dLBorderline High:  150 - 199 mg/dL  . HDL 04/28/2019 35.70* >39.00 mg/dL Final  . VLDL 04/28/2019 23.0  0.0 - 40.0 mg/dL Final  . LDL Cholesterol 04/28/2019 121* 0 - 99 mg/dL Final  . Total CHOL/HDL Ratio 04/28/2019 5   Final                  Men          Women1/2 Average Risk     3.4          3.3Average Risk          5.0          4.42X Average Risk          9.6          7.13X Average Risk          15.0          11.0                      . NonHDL 04/28/2019 144.35   Final   NOTE:  Non-HDL goal should be 30 mg/dL higher than patient's LDL goal (i.e. LDL goal of < 70 mg/dL, would have non-HDL goal of < 100 mg/dL)  . Sodium 04/28/2019 138  135 - 145 mEq/L Final  . Potassium 04/28/2019 4.0  3.5 - 5.1 mEq/L Final  . Chloride 04/28/2019 106  96 - 112 mEq/L Final  . CO2 04/28/2019 26  19 - 32 mEq/L Final  . Glucose, Bld 04/28/2019 145* 70 - 99 mg/dL Final  . BUN 04/28/2019 13  6 - 23 mg/dL Final  . Creatinine, Ser 04/28/2019 0.99  0.40 - 1.20 mg/dL  Final  . Total Bilirubin 04/28/2019 0.4  0.2 -  1.2 mg/dL Final  . Alkaline Phosphatase 04/28/2019 53  39 - 117 U/L Final  . AST 04/28/2019 13  0 - 37 U/L Final  . ALT 04/28/2019 11  0 - 35 U/L Final  . Total Protein 04/28/2019 7.5  6.0 - 8.3 g/dL Final  . Albumin 04/28/2019 4.0  3.5 - 5.2 g/dL Final  . GFR 04/28/2019 71.38  >60.00 mL/min Final  . Calcium 04/28/2019 9.4  8.4 - 10.5 mg/dL Final  . Hgb A1c MFr Bld 04/28/2019 6.5  4.6 - 6.5 % Final   Glycemic Control Guidelines for People with Diabetes:Non Diabetic:  <6%Goal of Therapy: <7%Additional Action Suggested:  >8%     Physical Examination:  BP 130/78 (BP Location: Left Arm, Patient Position: Sitting, Cuff Size: Large)   Pulse 99   Ht 5\' 8"  (1.727 m)   Wt 272 lb 6.4 oz (123.6 kg)   SpO2 98%   BMI 41.42 kg/m      ASSESSMENT:  Diabetes type 2, current BMI 41  See history of present illness for detailed discussion of current diabetes management, blood sugar patterns and problems identified  A1c is 6.5  She is having fairly good blood sugars with only Rybelsus 7 mg However the last week her fasting readings are high Also not checking blood sugars after meals much but most of her evening readings are fairly good She has some abdominal discomfort with taking Rybelsus on empty stomach and she is taking it now with hot tea in the last week which may be causing decreased absorption  Previously Ozempic was not covered although she was comfortable doing the injections  HYPOTHYROIDISM: She has been prescribed 300 mcg of levothyroxine daily However she is completely confused about her instructions and she thinks he is taking 1/2 tablet on most days and only 300 mcg once a week Very surprisingly her TSH is the same as on the last visit when she was taking 6-1/2 tablets a week She is not very symptomatic   LIPIDS: She has had hypercholesterolemia that is finally improved with taking Crestor 20 mg     PLAN:    She would like  to try again to take Rybelsus 7 mg with warm water instead of hot tea  If she cannot tolerate this with water she will need to try Ozempic and she was given a new prescription for this to start with 0.25 mg for the first 4 weeks and then 0.5 mg weekly, she will check to see if this is covered by her insurance and also given her a co-pay card  Encourage her to start regular exercise program  To call if morning sugars are consistently high  Also periodically check readings 2 hours after meals  For her thyroid since TSH is mildly increasing will only increase the regimen of 300 mcg that she is on by an extra 1 tablet a week, she can now start taking the full tablet 3 days a week on Monday/Wednesday/Friday and 1/2 tablet the other days  Follow-up in 3 months for repeat visit  Stay on Crestor as prescribed indefinitely  Patient Instructions  Thyroid pill 300mg  3/7 and 150ug on 4/7 days  Take Rybelsus with water  Check blood sugars on waking up days a week  Also check blood sugars about 2 hours after meals and do this after different meals by rotation  Recommended blood sugar levels on waking up are 90-130 and about 2 hours after meal is 130-160  Please bring your blood sugar  monitor to each visit, thank you          Elayne Snare 05/11/2019, 9:00 PM   Note: This office note was prepared with Dragon voice recognition system technology. Any transcriptional errors that result from this process are unintentional.

## 2019-05-13 ENCOUNTER — Other Ambulatory Visit (HOSPITAL_COMMUNITY)
Admission: RE | Admit: 2019-05-13 | Discharge: 2019-05-13 | Disposition: A | Discharge status: Home / Self Care | Payer: BC Managed Care – PPO | Source: Ambulatory Visit | Attending: Nurse Practitioner | Admitting: Nurse Practitioner

## 2019-05-13 DIAGNOSIS — Z20822 Contact with and (suspected) exposure to covid-19: Secondary | ICD-10-CM | POA: Diagnosis not present

## 2019-05-13 DIAGNOSIS — Z01812 Encounter for preprocedural laboratory examination: Secondary | ICD-10-CM | POA: Insufficient documentation

## 2019-05-13 LAB — SARS CORONAVIRUS 2 (TAT 6-24 HRS): SARS Coronavirus 2: NEGATIVE

## 2019-05-17 ENCOUNTER — Ambulatory Visit (INDEPENDENT_AMBULATORY_CARE_PROVIDER_SITE_OTHER): Payer: BC Managed Care – PPO

## 2019-05-17 ENCOUNTER — Other Ambulatory Visit: Payer: Self-pay

## 2019-05-17 DIAGNOSIS — R42 Dizziness and giddiness: Secondary | ICD-10-CM

## 2019-05-17 DIAGNOSIS — R0609 Other forms of dyspnea: Secondary | ICD-10-CM

## 2019-05-17 DIAGNOSIS — R0600 Dyspnea, unspecified: Secondary | ICD-10-CM

## 2019-05-17 LAB — EXERCISE TOLERANCE TEST
Estimated workload: 7 METS
Exercise duration (min): 6 min
Exercise duration (sec): 0 s
MPHR: 169 {beats}/min
Peak HR: 166 {beats}/min
Percent HR: 98 %
RPE: 17
Rest HR: 85 {beats}/min

## 2019-05-19 ENCOUNTER — Telehealth: Payer: Self-pay

## 2019-05-19 DIAGNOSIS — R9439 Abnormal result of other cardiovascular function study: Secondary | ICD-10-CM

## 2019-05-19 NOTE — Telephone Encounter (Signed)
-----   Message from Flossie Buffy, NP sent at 05/18/2019 10:28 AM EST ----- Abnormal stress test. I recommend referral to cardiology. Enter referral if she is ok with that.

## 2019-05-23 ENCOUNTER — Other Ambulatory Visit: Payer: Self-pay | Admitting: Endocrinology

## 2019-05-27 NOTE — Progress Notes (Signed)
Cardiology Office Note   Date:  05/30/2019   ID:  Dannelle Bargerstock, DOB Jun 11, 1965, MRN [id number]  PCP:  Flossie Buffy, NP  Cardiologist:   Delberta Folts [place], MD   Chief Complaint  Patient presents with  . Chest Pain  . Shortness of Breath      History of Present Illness: Gail Barr is a 54 y.o. female who is seen at the request of Dr Lorayne Marek for evaluation of an abnormal stress test. She has a history of DM type 2, CKD. 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 Myoview was normal in November 2015. More recently she had a ETT for evaluation of DOE and dizziness. She reports that 4 months ago she joined an exercise group. With exercise such as burpee's she will get a sharp pain in her chest but not with walking. She does note some chest tightness with periods of stress. Some numbness in her left arm and tingling in her face. She has a history of contrast allergy.  On her stress test she walked for 6 minutes on the Bruce protocol. Test stopped due to hypertensive BP response. No chest pain and no ST depression. Interpretation limited by resting Ecg changes of RBBB.     Past Medical History:  Diagnosis Date  . Abnormal Pap smear early 2000's   had colposcopy, no additional treatment. normal paps since  . Chronic kidney disease 2018   one kidney  . Diabetes mellitus without complication ([place])   . Glaucoma    no eye drops  . History of chickenpox   . Hypothyroidism 1993  . Multiple thyroid nodules   . Pyelonephritis 1993   when pregnant; subsequently found to have shrunken, nonfunctioning kidney on one side  . Unilateral renal atrophy     Past Surgical History:  Procedure Laterality Date  . DILITATION & CURRETTAGE/HYSTROSCOPY WITH NOVASURE ABLATION N/A 03/02/2013   Procedure: DILATATION & CURETTAGE/HYSTEROSCOPY WITH NOVASURE ABLATION;  Surgeon: Olga Millers, MD;  Location: [facility];  Service: Gynecology;  Laterality: N/A;     Current Outpatient Medications  Medication Sig  Dispense Refill  . B-D UF III MINI PEN NEEDLES 31G X 5 MM MISC USE AS DIRECTED AT BEDTIME  2  . glucose blood test strip Check blood glucose 4 time daily (before meals and at bed time). E11.29. One touch Veiro meter. (Patient taking differently: Check blood glucose 2 time daily (before meals and at bed time). E11.29. One touch Veiro meter.) 100 each 12  . Insulin Pen Needle (PEN NEEDLES) 31G X 6 MM MISC 1 application by Does not apply route at bedtime. 100 each 2  . Lancet Device MISC Check blood glucose 4 time daily (before meals and at bed time). E11.29. One touch Veiro meter. (Patient taking differently: Check blood glucose 3 time daily (before meals and at bed time). E11.29. One touch Veiro meter.) 100 each 12  . levothyroxine (SYNTHROID) 300 MCG tablet TAKE 1 TABLET BY MOUTH EVERY DAY BEFORE BREAKFAST 90 tablet 0  . Multiple Vitamins-Minerals (MULTIVITAL PO) Take by mouth as needed.     . rosuvastatin (CRESTOR) 20 MG tablet Take 1 tablet (20 mg total) by mouth daily. 90 tablet 1  . RYBELSUS 7 MG TABS TAKE 1 TABLET BY MOUTH DAILY BEFORE BREAKFAST. TAKE 30 MINUTES BEFORE BREAKFAST WITH WATER 30 tablet 3  . Vitamin D, Ergocalciferol, (DRISDOL) 1.25 MG (50000 UT) CAPS capsule Take 1 capsule (50,000 Units total) by mouth every 7 (seven) days. 12  capsule 0   No current facility-administered medications for this visit.    Allergies:   Betadine [povidone iodine], Ibuprofen, Iodinated diagnostic agents, Iodine, and Iohexol    Social History:  The patient  reports that she has never smoked. She has never used smokeless tobacco. She reports current alcohol use. She reports that she does not use drugs.   Family History:  The patient's family history includes Asthma in her brother; Cancer in her maternal grandfather; Diabetes in her brother, maternal aunt, maternal grandmother, and maternal uncle.    ROS:  Please see the history of present illness.   Otherwise, review of systems are positive for none.    All other systems are reviewed and negative.    PHYSICAL EXAM: VS:  BP 134/85   Pulse 72   Temp (!) 96.2 F (35.7 C)   Resp 15   Ht 5\' 8"  (1.727 m)   Wt 279 lb 6.4 oz (126.7 kg)   SpO2 97%   BMI 42.48 kg/m  , BMI Body mass index is 42.48 kg/m. GEN: Well nourished, obese, in no acute distress  HEENT: normal  Neck: no JVD, carotid bruits, or masses Cardiac: RRR; no murmurs, rubs, or gallops,no edema  Respiratory:  clear to auscultation bilaterally, normal work of breathing GI: soft, nontender, nondistended, + BS MS: no deformity or atrophy  Skin: warm and dry, no rash Neuro:  Strength and sensation are intact Psych: euthymic mood, full affect   EKG:  EKG is ordered today. The ekg ordered today demonstrates NSR rate 80. RBBB. LAD. I have personally reviewed and interpreted this study.    Recent Labs: 11/07/2018: Hemoglobin 11.8; Platelets 331 04/28/2019: ALT 11; BUN 13; Creatinine, Ser 0.99; Potassium 4.0; Sodium 138; TSH 7.69    Lipid Panel    Component Value Date/Time   CHOL 180 04/28/2019 0832   TRIG 115.0 04/28/2019 0832   HDL 35.70 (L) 04/28/2019 0832   CHOLHDL 5 04/28/2019 0832   VLDL 23.0 04/28/2019 0832   LDLCALC 121 (H) 04/28/2019 0832   LDLDIRECT 125.0 12/15/2018 1438      Wt Readings from Last 3 Encounters:  05/30/19 279 lb 6.4 oz (126.7 kg)  05/11/19 272 lb 6.4 oz (123.6 kg)  02/08/19 272 lb 3.2 oz (123.5 kg)      Other studies Reviewed: Additional studies/ records that were reviewed today include:   ETT 05/17/19: Study Highlights   Blood pressure demonstrated a hypertensive response to exercise.  There was no ST segment deviation noted during stress.  Negative, adequate stress test.  Patient has a RBBB at baseline with significant ST depression. This lowers the sensitivity of a treadmill stress test for detecting ischemia. If clinical suspicion is high, consider additional testing such as a nuclear stress, coronary CT-A or cardiology  consultation.      ASSESSMENT AND PLAN:  1.  Chest pain atypical but moderate risk from a cardiac standpoint due to risk factors of DM, obesity and HLD. ETT not really diagnostic but no definite ischemia seen. I think further evaluation is needed. Recommend a repeat Lexiscan Myoview.  Coronary CTA is a consideration but given history of contrast allergy I would prefer a Myoview study. If normal will reassure and encourage her efforts at exercise and weight loss. 2. RBBB chronic 3. DM type 2 4. Hypercholesterolemia. On Crestor 5. Obesity.   Current medicines are reviewed at length with the patient today.  The patient does not have concerns regarding medicines.  The following changes have been  made:  no change  Labs/ tests ordered today include: Lexiscan myoview No orders of the defined types were placed in this encounter.    Disposition:   FU TBD Signed, Shakala Marlatt [place], MD  05/30/2019 9:19 AM    [facility] [address], [place], [place], [address] Phone [phone], Fax [phone]

## 2019-05-30 ENCOUNTER — Encounter: Payer: Self-pay | Admitting: Cardiology

## 2019-05-30 ENCOUNTER — Ambulatory Visit (INDEPENDENT_AMBULATORY_CARE_PROVIDER_SITE_OTHER): Payer: BC Managed Care – PPO | Admitting: Cardiology

## 2019-05-30 ENCOUNTER — Other Ambulatory Visit: Payer: Self-pay

## 2019-05-30 VITALS — BP 134/85 | HR 72 | Temp 96.2°F | Resp 15 | Ht 68.0 in | Wt 279.4 lb

## 2019-05-30 DIAGNOSIS — E7800 Pure hypercholesterolemia, unspecified: Secondary | ICD-10-CM

## 2019-05-30 DIAGNOSIS — E119 Type 2 diabetes mellitus without complications: Secondary | ICD-10-CM

## 2019-05-30 DIAGNOSIS — R079 Chest pain, unspecified: Secondary | ICD-10-CM | POA: Diagnosis not present

## 2019-05-30 DIAGNOSIS — I4510 Unspecified right bundle-branch block: Secondary | ICD-10-CM | POA: Diagnosis not present

## 2019-05-30 NOTE — Patient Instructions (Signed)
Medication Instructions:  Continue same medications    Lab Work: None ordered   Testing/Procedures: Schedule Lexiscan   Follow-Up: At [facility], you and your health needs are our priority.  As part of our continuing mission to provide you with exceptional heart care, we have created designated Provider Care Teams.  These Care Teams include your primary Cardiologist (physician) and Advanced Practice Providers (APPs -  Physician Assistants and Nurse Practitioners) who all work together to provide you with the care you need, when you need it.  We recommend signing up for the patient portal called "MyChart".  Sign up information is provided on this After Visit Summary.  MyChart is used to connect with patients for Virtual Visits (Telemedicine).  Patients are able to view lab/test results, encounter notes, upcoming appointments, etc.  Non-urgent messages can be sent to your provider as well.   To learn more about what you can do with MyChart, go to [email or web address].    Your next appointment:  Will be determined after test  The format for your next appointment: Office    Provider:  Dr.Jordan

## 2019-06-14 ENCOUNTER — Telehealth (HOSPITAL_COMMUNITY): Payer: Self-pay

## 2019-06-16 ENCOUNTER — Ambulatory Visit (HOSPITAL_COMMUNITY)
Admission: RE | Admit: 2019-06-16 | Discharge: 2019-06-16 | Disposition: A | Discharge status: Home / Self Care | Payer: BC Managed Care – PPO | Source: Ambulatory Visit | Attending: Cardiovascular Disease | Admitting: Cardiovascular Disease

## 2019-06-16 ENCOUNTER — Other Ambulatory Visit: Payer: Self-pay

## 2019-06-16 DIAGNOSIS — R079 Chest pain, unspecified: Secondary | ICD-10-CM

## 2019-06-16 MED ORDER — TECHNETIUM TC 99M TETROFOSMIN IV KIT
27.5000 | PACK | Freq: Once | INTRAVENOUS | Status: AC | PRN
  Administered 2019-06-16: 27.5 via INTRAVENOUS
  Filled 2019-06-16: qty 28

## 2019-06-16 MED ORDER — REGADENOSON 0.4 MG/5ML IV SOLN
0.4000 mg | Freq: Once | INTRAVENOUS | Status: AC
  Administered 2019-06-16: 0.4 mg via INTRAVENOUS

## 2019-06-17 ENCOUNTER — Ambulatory Visit (HOSPITAL_COMMUNITY)
Admission: RE | Admit: 2019-06-17 | Discharge: 2019-06-17 | Disposition: A | Discharge status: Home / Self Care | Payer: BC Managed Care – PPO | Source: Ambulatory Visit | Attending: Internal Medicine | Admitting: Internal Medicine

## 2019-06-17 LAB — MYOCARDIAL PERFUSION IMAGING
LV dias vol: 102 mL (ref 46–106)
LV sys vol: 43 mL
Peak HR: 107 {beats}/min
Rest HR: 69 {beats}/min
SDS: 0
SRS: 4
SSS: 4
TID: 0.91

## 2019-06-17 MED ORDER — TECHNETIUM TC 99M TETROFOSMIN IV KIT
29.7000 | PACK | Freq: Once | INTRAVENOUS | Status: AC | PRN
  Administered 2019-06-17: 29.7 via INTRAVENOUS

## 2019-07-08 ENCOUNTER — Encounter: Payer: Self-pay | Admitting: Nurse Practitioner

## 2019-07-08 ENCOUNTER — Telehealth (INDEPENDENT_AMBULATORY_CARE_PROVIDER_SITE_OTHER): Payer: BC Managed Care – PPO | Admitting: Nurse Practitioner

## 2019-07-08 ENCOUNTER — Other Ambulatory Visit: Payer: Self-pay

## 2019-07-08 VITALS — Temp 98.9°F | Ht 68.0 in | Wt 267.0 lb

## 2019-07-08 DIAGNOSIS — F5102 Adjustment insomnia: Secondary | ICD-10-CM | POA: Diagnosis not present

## 2019-07-08 DIAGNOSIS — F4321 Adjustment disorder with depressed mood: Secondary | ICD-10-CM | POA: Diagnosis not present

## 2019-07-08 MED ORDER — ALPRAZOLAM 0.25 MG PO TABS: 0.2500 mg | ORAL_TABLET | Freq: Two times a day (BID) | ORAL | 0 refills | Status: DC | PRN

## 2019-07-08 NOTE — Patient Instructions (Signed)
Will complete FMLA form 07/11/2019-08/10/2019 Use alprazolam prn You will be contacted to schedule appt with therapist. Schedule f/up appt if mood is not improving in next 1-2weeks.   Managing Loss, Adult People experience loss in many different ways throughout their lives. Events such as moving, changing jobs, and losing friends can create a sense of loss. The loss may be as serious as a major health change, divorce, death of a pet, or death of a loved one. All of these types of loss are likely to create a physical and emotional reaction known as grief. Grief is the result of a major change or an absence of something or someone that you count on. Grief is a normal reaction to loss. A variety of factors can affect your grieving experience, including:  The nature of your loss.  Your relationship to what or whom you lost.  Your understanding of grief and how to manage it.  Your support system. How to manage lifestyle changes Keep to your normal routine as much as possible.  If you have trouble focusing or doing normal activities, it is acceptable to take some time away from your normal routine.  Spend time with friends and loved ones.  Eat a healthy diet, get plenty of sleep, and rest when you feel tired. How to recognize changes  The way that you deal with your grief will affect your ability to function as you normally do. When grieving, you may experience these changes:  Numbness, shock, sadness, anxiety, anger, denial, and guilt.  Thoughts about death.  Unexpected crying.  A physical sensation of emptiness in your stomach.  Problems sleeping and eating.  Tiredness (fatigue).  Loss of interest in normal activities.  Dreaming about or imagining seeing the person who died.  A need to remember what or whom you lost.  Difficulty thinking about anything other than your loss for a period of time.  Relief. If you have been expecting the loss for a while, you may feel a sense  of relief when it happens. Follow these instructions at home:  Activity Express your feelings in healthy ways, such as:  Talking with others about your loss. It may be helpful to find others who have had a similar loss, such as a support group.  Writing down your feelings in a journal.  Doing physical activities to release stress and emotional energy.  Doing creative activities like painting, sculpting, or playing or listening to music.  Practicing resilience. This is the ability to recover and adjust after facing challenges. Reading some resources that encourage resilience may help you to learn ways to practice those behaviors. General instructions  Be patient with yourself and others. Allow the grieving process to happen, and remember that grieving takes time. ? It is likely that you may never feel completely done with some grief. You may find a way to move on while still cherishing memories and feelings about your loss. ? Accepting your loss is a process. It can take months or longer to adjust.  Keep all follow-up visits as told by your health care provider. This is important. Where to find support To get support for managing loss:  Ask your health care provider for help and recommendations, such as grief counseling or therapy.  Think about joining a support group for people who are managing a loss. Where to find more information You can find more information about managing loss from:  American Society of Clinical Oncology: www.cancer.net  American Psychological Association: TVStereos.ch Contact a  health care provider if:  Your grief is extreme and keeps getting worse.  You have ongoing grief that does not improve.  Your body shows symptoms of grief, such as illness.  You feel depressed, anxious, or lonely. Get help right away if:  You have thoughts about hurting yourself or others. If you ever feel like you may hurt yourself or others, or have thoughts about taking  your own life, get help right away. You can go to your nearest emergency department or call:  Your local emergency services (911 in the [place]  A suicide crisis helpline, such as the [facility] at (773)867-6108. This is open 24 hours a day. Summary  Grief is the result of a major change or an absence of someone or something that you count on. Grief is a normal reaction to loss.  The depth of grief and the period of recovery depend on the type of loss and your ability to adjust to the change and process your feelings.  Processing grief requires patience and a willingness to accept your feelings and talk about your loss with people who are supportive.  It is important to find resources that work for you and to realize that people experience grief differently. There is not one grieving process that works for everyone in the same way.  Be aware that when grief becomes extreme, it can lead to more severe issues like isolation, depression, anxiety, or suicidal thoughts. Talk with your health care provider if you have any of these issues. This information is not intended to replace advice given to you by your health care provider. Make sure you discuss any questions you have with your health care provider. Document Revised: 05/14/2018 Document Reviewed: 07/24/2016 Elsevier Patient Education  [place].
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
## 2019-07-14 ENCOUNTER — Ambulatory Visit (INDEPENDENT_AMBULATORY_CARE_PROVIDER_SITE_OTHER): Payer: BC Managed Care – PPO | Admitting: Psychologist

## 2019-07-14 DIAGNOSIS — Z634 Disappearance and death of family member: Secondary | ICD-10-CM

## 2019-07-14 DIAGNOSIS — F4323 Adjustment disorder with mixed anxiety and depressed mood: Secondary | ICD-10-CM | POA: Diagnosis not present

## 2019-07-19 ENCOUNTER — Telehealth: Payer: Self-pay | Admitting: Nurse Practitioner

## 2019-07-19 NOTE — Telephone Encounter (Signed)
Patient is calling and wanted to see if Gail Barr received paperwork from [employer] that was faxed. CB is [phone]

## 2019-07-19 NOTE — Telephone Encounter (Signed)
Working on [employer]

## 2019-07-21 ENCOUNTER — Other Ambulatory Visit: Payer: Self-pay

## 2019-07-22 NOTE — Telephone Encounter (Signed)
Form faxed with last office visit note--do not have record from her therapy yet.   Left detail message inform the pt this.

## 2019-07-26 ENCOUNTER — Ambulatory Visit: Payer: BC Managed Care – PPO | Admitting: Psychologist

## 2019-07-28 ENCOUNTER — Telehealth: Payer: Self-pay | Admitting: Nurse Practitioner

## 2019-07-28 NOTE — Telephone Encounter (Signed)
Charlotte please advise.  Called disability back to schedule a spot tomorrow an was advise that it has to be on 5/10 or 5/11.

## 2019-07-28 NOTE — Telephone Encounter (Signed)
Gail Barr is calling and wanted to see if Gail Barr could participate in a peer review for patients disability. CB is [phone]

## 2019-07-28 NOTE — Telephone Encounter (Signed)
Charlotte please advise.  Chong Sicilian is calling to see if you could participate in peer review for pt's disability.

## 2019-07-28 NOTE — Telephone Encounter (Signed)
My schedule is filled on those days. You can schedule on 5/13 or 5/14

## 2019-07-28 NOTE — Telephone Encounter (Signed)
Please block slot on schedule tomorrow for this. Will need number to call

## 2019-07-29 ENCOUNTER — Telehealth: Payer: Self-pay | Admitting: Nurse Practitioner

## 2019-07-29 NOTE — Telephone Encounter (Signed)
Patient is calling and stated that the xanax is not helping. Patient stated that she is still not sleeping and is having some depression. CB is [phone]

## 2019-07-29 NOTE — Telephone Encounter (Signed)
Needs to schedule another appt to discuss change in medication

## 2019-07-29 NOTE — Telephone Encounter (Signed)
Please call Dr. Caesar Chestnut regarding a peer to peer call for this patient. Her number is [phone].

## 2019-07-29 NOTE — Telephone Encounter (Signed)
Charlotte please advise.  Pt states that she doesn't feel the Xanax is helping her, she still is not feeling good and feeling down. Pt also stated that she isn't sleeping good and she is depressed. She stated to reach back out to you if things didn't get better.

## 2019-07-29 NOTE — Telephone Encounter (Signed)
The disability peer team was contacted and they are aware your not available on 5/10 or 5/11. They stated those are the only days available but they will reach out on those days if your not available they will leave a message with their information and you can call at your convenience within a time frame.   They were unavailable to schedule anything other than 5/10 or 5/11.

## 2019-08-01 NOTE — Telephone Encounter (Signed)
No responds Left VM: provided my name and office number to call back

## 2019-08-01 NOTE — Telephone Encounter (Signed)
Pt. Is scheduled this Friday for My Chart video to discuss medications.

## 2019-08-05 ENCOUNTER — Encounter: Payer: Self-pay | Admitting: Nurse Practitioner

## 2019-08-05 ENCOUNTER — Telehealth (INDEPENDENT_AMBULATORY_CARE_PROVIDER_SITE_OTHER): Payer: BC Managed Care – PPO | Admitting: Nurse Practitioner

## 2019-08-05 ENCOUNTER — Telehealth: Payer: Self-pay | Admitting: Nurse Practitioner

## 2019-08-05 ENCOUNTER — Other Ambulatory Visit: Payer: Self-pay

## 2019-08-05 VITALS — Temp 98.7°F | Ht 68.0 in | Wt 267.0 lb

## 2019-08-05 DIAGNOSIS — F5102 Adjustment insomnia: Secondary | ICD-10-CM

## 2019-08-05 DIAGNOSIS — F4323 Adjustment disorder with mixed anxiety and depressed mood: Secondary | ICD-10-CM | POA: Diagnosis not present

## 2019-08-05 DIAGNOSIS — F321 Major depressive disorder, single episode, moderate: Secondary | ICD-10-CM | POA: Insufficient documentation

## 2019-08-05 MED ORDER — ZALEPLON 5 MG PO CAPS: 5.0000 mg | ORAL_CAPSULE | Freq: Every evening | ORAL | 0 refills | Status: DC | PRN

## 2019-08-05 MED ORDER — VENLAFAXINE HCL 25 MG PO TABS: 25.0000 mg | ORAL_TABLET | Freq: Two times a day (BID) | ORAL | 2 refills | Status: DC

## 2019-08-05 NOTE — Progress Notes (Signed)
Virtual Visit via Video Note  I connected with@ on 08/05/19 at 11:00 AM EDT by a video enabled telemedicine application and verified that I am speaking with the correct person using two identifiers.  Location: Patient:Home Provider: Office Participants: patient and provider  I discussed the limitations of evaluation and management by telemedicine and the availability of in person appointments. I also discussed with the patient that there may be a patient responsible charge related to this service. The patient expressed understanding and agreed to proceed.  [id number] xanax-pt stated she took xanax for a couple of days and then stopped//pt said it made her have rapid thoughts like her mind couldn't slow down and have bad dreams-she felt it wasn't for her//pt had first Covid shot 07/29/19(documented)//Eye exam late last year  History of Present Illness: Ms. Holzer reports no improvement in her mood. She had adverse effects with alprazolam(increase anxiety and unable to focus. She denies any SI/HI. She start CBT sessions and has another appt on 08/08/2019. Mood has led to interrupted sleep and premature waking, loss of interest and decreased energy.   Observations/Objective: Physical Exam  Constitutional: No distress.  Pulmonary/Chest: Effort normal.  Psychiatric: Her speech is normal. Judgment and thought content normal. Cognition and memory are normal. She exhibits a depressed mood.   Assessment and Plan: Jami was seen today for follow-up.  Diagnoses and all orders for this visit:  Adjustment disorder with mixed anxiety and depressed mood -     Discontinue: venlafaxine (EFFEXOR) 25 MG tablet; Take 1 tablet (25 mg total) by mouth 2 (two) times daily with a meal. -     venlafaxine (EFFEXOR) 25 MG tablet; Take 1 tablet (25 mg total) by mouth 2 (two) times daily with a meal.  Adjustment insomnia -     zaleplon (SONATA) 5 MG capsule; Take 1 capsule (5 mg total) by mouth at bedtime as  needed for sleep.   Follow Up Instructions: changed end date for FMLA to 08/24/2019 Start effexor: 1tab daily x 1week then 1tab AM and PM continuously. F/up in 20month. continue therapy sessions.   I discussed the assessment and treatment plan with the patient. The patient was provided an opportunity to ask questions and all were answered. The patient agreed with the plan and demonstrated an understanding of the instructions.   The patient was advised to call back or seek an in-person evaluation if the symptoms worsen or if the condition fails to improve as anticipated.  Wilfred Lacy, NP

## 2019-08-05 NOTE — Telephone Encounter (Signed)
Dr. Lindalou Hose called back and I informed him you would call him on Monday. He said after 10am would be best.

## 2019-08-05 NOTE — Telephone Encounter (Signed)
[facility] is calling back to give the number to contact the peer to peer for disability claims for patient. CB number is [phone]

## 2019-08-05 NOTE — Telephone Encounter (Signed)
I just called again and had to leave a message. If they call back today, please schedule a time on my schedule on Monday for this.

## 2019-08-05 NOTE — Patient Instructions (Addendum)
changed end date for FMLA to 08/24/2019. Start effexor: 1tab daily x 1week then 1tab AM and PM continuously. F/up in 71month. continue therapy sessions.

## 2019-08-05 NOTE — Telephone Encounter (Signed)
Dr. Alain Marion to do a peer to peer regarding this patients short term disability. [phone]. He is available after 2:30 today.

## 2019-08-05 NOTE — Telephone Encounter (Signed)
Gail Barr please advise. Dr Lindalou Hose returned your call about the short term diability and is available today after 2:30. Call back [phone].

## 2019-08-05 NOTE — Telephone Encounter (Signed)
No response. LVM with my name and office number

## 2019-08-05 NOTE — Assessment & Plan Note (Signed)
Gail Barr reports no improvement in her mood. She had adverse effects with alprazolam(increase anxiety and unable to focus. She denies any SI/HI. She start CBT sessions and has another appt on 08/08/2019. Mood has led to interrupted sleep and premature waking, loss of interest and decreased energy.  changed end date for FMLA to 08/24/2019 Start effexor: 1tab daily x 1week then 1tab AM and PM continuously. F/up in 58month. continue therapy sessions.

## 2019-08-05 NOTE — Telephone Encounter (Signed)
Macie Burows I got Edd Arbour to block off for this peer review meeting at 2pm Monday 08/08/19.

## 2019-08-08 ENCOUNTER — Ambulatory Visit (INDEPENDENT_AMBULATORY_CARE_PROVIDER_SITE_OTHER): Payer: BC Managed Care – PPO | Admitting: Psychologist

## 2019-08-08 DIAGNOSIS — Z634 Disappearance and death of family member: Secondary | ICD-10-CM

## 2019-08-08 DIAGNOSIS — F4323 Adjustment disorder with mixed anxiety and depressed mood: Secondary | ICD-10-CM | POA: Diagnosis not present

## 2019-08-08 NOTE — Telephone Encounter (Signed)
Charlotte please advise 

## 2019-08-08 NOTE — Telephone Encounter (Signed)
Patient called back and asked if Gail Barr can fax everything over to the peer to peer why she is out of work including the recent visit. Patient asked if this can be done today. Patient stated that she didn't have the fax number with her. CB is [phone]

## 2019-08-08 NOTE — Telephone Encounter (Signed)
I completed peer to peer with Dr. Lindalou Hose. He did not ask for any notes to be faxed. Clarify with Ms. Bussiere what fax is for?

## 2019-08-08 NOTE — Telephone Encounter (Signed)
Gail Barr pt doesn't think she needs anything faxed this was before your peer to peer meeting, she wasn't sure if you would be connected or not. Pt stated she should be ok since you talked to them and she told me to thank you for your time and doing that for her.

## 2019-08-09 ENCOUNTER — Other Ambulatory Visit (INDEPENDENT_AMBULATORY_CARE_PROVIDER_SITE_OTHER): Payer: BC Managed Care – PPO

## 2019-08-09 ENCOUNTER — Other Ambulatory Visit: Payer: Self-pay

## 2019-08-09 DIAGNOSIS — E063 Autoimmune thyroiditis: Secondary | ICD-10-CM

## 2019-08-09 DIAGNOSIS — E1165 Type 2 diabetes mellitus with hyperglycemia: Secondary | ICD-10-CM | POA: Diagnosis not present

## 2019-08-09 LAB — BASIC METABOLIC PANEL
BUN: 17 mg/dL (ref 6–23)
CO2: 28 mEq/L (ref 19–32)
Calcium: 9 mg/dL (ref 8.4–10.5)
Chloride: 105 mEq/L (ref 96–112)
Creatinine, Ser: 1.14 mg/dL (ref 0.40–1.20)
GFR: 60.59 mL/min (ref >60.00)
Glucose, Bld: 178 mg/dL — ABNORMAL HIGH (ref 70–99)
Potassium: 4.3 mEq/L (ref 3.5–5.1)
Sodium: 137 mEq/L (ref 135–145)

## 2019-08-09 LAB — HEMOGLOBIN A1C: Hgb A1c MFr Bld: 7 % — ABNORMAL HIGH (ref 4.6–6.5)

## 2019-08-09 LAB — T4, FREE: Free T4: 0.5 ng/dL — ABNORMAL LOW (ref 0.60–1.60)

## 2019-08-09 LAB — TSH: TSH: 15.21 u[IU]/mL — ABNORMAL HIGH (ref 0.35–4.50)

## 2019-08-12 ENCOUNTER — Ambulatory Visit: Payer: BC Managed Care – PPO | Admitting: Endocrinology

## 2019-08-12 ENCOUNTER — Telehealth: Payer: Self-pay | Admitting: Nurse Practitioner

## 2019-08-12 DIAGNOSIS — Z0289 Encounter for other administrative examinations: Secondary | ICD-10-CM

## 2019-08-12 NOTE — Telephone Encounter (Signed)
I need name and phone number of person she is communicating with.

## 2019-08-12 NOTE — Telephone Encounter (Signed)
Patient called back to give the fax number to fax forms. Fax number is [phone]

## 2019-08-12 NOTE — Telephone Encounter (Signed)
Gail Barr please advise.  Pt called saying that her peer to peer people tried to tell her that they didn't reach you. Pt was notified that you blocked off your schedule and spoke to Dr. Lindalou Hose who didn't say anything about needing paper work faxed over.  Pt stating that they were suppose to fax something over to [place]. She was given our fax number but she stated we needed to fax over diagnosis and any proof of peer discussion to fax number-[phone].

## 2019-08-12 NOTE — Telephone Encounter (Signed)
Patient is requesting call back about her benefits paperwork that were faxed to [employer]. [employer] states they have not been able to reach the provider yet.

## 2019-08-15 NOTE — Telephone Encounter (Signed)
Patient is calling and requesting a call back regarding the fax that was sent. CB is [phone].

## 2019-08-16 ENCOUNTER — Telehealth: Payer: Self-pay | Admitting: Nurse Practitioner

## 2019-08-16 ENCOUNTER — Encounter: Payer: Self-pay | Admitting: Nurse Practitioner

## 2019-08-18 NOTE — Telephone Encounter (Signed)
error 

## 2019-08-23 ENCOUNTER — Ambulatory Visit: Payer: Self-pay | Admitting: Psychologist

## 2019-09-03 ENCOUNTER — Other Ambulatory Visit: Payer: Self-pay | Admitting: Nurse Practitioner

## 2019-09-03 DIAGNOSIS — F4323 Adjustment disorder with mixed anxiety and depressed mood: Secondary | ICD-10-CM

## 2019-09-05 ENCOUNTER — Other Ambulatory Visit: Payer: Self-pay

## 2019-09-06 ENCOUNTER — Ambulatory Visit (INDEPENDENT_AMBULATORY_CARE_PROVIDER_SITE_OTHER): Payer: BC Managed Care – PPO | Admitting: Nurse Practitioner

## 2019-09-06 ENCOUNTER — Encounter: Payer: Self-pay | Admitting: Nurse Practitioner

## 2019-09-06 VITALS — BP 117/72 | HR 73 | Temp 96.8°F | Ht 68.0 in | Wt 278.8 lb

## 2019-09-06 DIAGNOSIS — F321 Major depressive disorder, single episode, moderate: Secondary | ICD-10-CM

## 2019-09-06 NOTE — Progress Notes (Signed)
Subjective:  Patient ID: Gail Barr, female    DOB: 11-08-1965  Age: 54 y.o. MRN: [id number]  CC: Follow-up (Anxiety and Depression-not 100 but 85 but feels better//no eye exam this year//no pnuemonia)  HPI Anxiety and Depression: Unable to tolerate effexor after 2doses (increase anxiety) She prefers not to take any additional medication at this time. She prefers to continue sessions with therapist and medication at this time. Depression screen [facility] 2/9 09/06/2019 07/08/2019 04/14/2019  Decreased Interest 1 2 0  Down, Depressed, Hopeless 2 3 0  PHQ - 2 Score 3 5 0  Altered sleeping - 3 -  Tired, decreased energy 3 3 -  Change in appetite 3 1 -  Feeling bad or failure about yourself  1 0 -  Trouble concentrating 1 3 -  Moving slowly or fidgety/restless 2 1 -  Suicidal thoughts 0 0 -  PHQ-9 Score - 16 -   GAD 7 : Generalized Anxiety Score 09/06/2019  Nervous, Anxious, on Edge 1  Control/stop worrying 2  Worry too much - different things 3  Restless 1  Easily annoyed or irritable 1  Afraid - awful might happen 1   Reviewed past Medical, Social and Family history today.  Outpatient Medications Prior to Visit  Medication Sig Dispense Refill  . B-D UF III MINI PEN NEEDLES 31G X 5 MM MISC USE AS DIRECTED AT BEDTIME  2  . glucose blood test strip Check blood glucose 4 time daily (before meals and at bed time). E11.29. One touch Veiro meter. (Patient taking differently: Check blood glucose 2 time daily (before meals and at bed time). E11.29. One touch Veiro meter.) 100 each 12  . Insulin Pen Needle (PEN NEEDLES) 31G X 6 MM MISC 1 application by Does not apply route at bedtime. 100 each 2  . Lancet Device MISC Check blood glucose 4 time daily (before meals and at bed time). E11.29. One touch Veiro meter. (Patient taking differently: Check blood glucose 3 time daily (before meals and at bed time). E11.29. One touch Veiro meter.) 100 each 12  . levothyroxine (SYNTHROID) 300 MCG tablet TAKE  1 TABLET BY MOUTH EVERY DAY BEFORE BREAKFAST 90 tablet 0  . Multiple Vitamins-Minerals (MULTIVITAL PO) Take by mouth as needed.     . rosuvastatin (CRESTOR) 20 MG tablet Take 1 tablet (20 mg total) by mouth daily. 90 tablet 1  . RYBELSUS 7 MG TABS TAKE 1 TABLET BY MOUTH DAILY BEFORE BREAKFAST. TAKE 30 MINUTES BEFORE BREAKFAST WITH WATER 30 tablet 3  . Vitamin D, Ergocalciferol, (DRISDOL) 1.25 MG (50000 UT) CAPS capsule Take 1 capsule (50,000 Units total) by mouth every 7 (seven) days. 12 capsule 0  . venlafaxine (EFFEXOR) 25 MG tablet Take 1 tablet (25 mg total) by mouth 2 (two) times daily with a meal. Needs appt for additional refills 180 tablet 0  . zaleplon (SONATA) 5 MG capsule Take 1 capsule (5 mg total) by mouth at bedtime as needed for sleep. (Patient not taking: Reported on 09/06/2019) 30 capsule 0  . ALPRAZolam (XANAX) 0.25 MG tablet Take 1-2 tablets (0.25-0.5 mg total) by mouth 2 (two) times daily as needed for anxiety. (Patient not taking: Reported on 08/05/2019) 14 tablet 0   No facility-administered medications prior to visit.    ROS See HPI  Objective:  BP 117/72   Pulse 73   Temp (!) 96.8 F (36 C) (Tympanic)   Ht 5\' 8"  (1.727 m)   Wt 278 lb 12.8 oz (126.5 kg)  SpO2 97%   BMI 42.39 kg/m   BP Readings from Last 3 Encounters:  09/06/19 117/72  05/30/19 134/85  05/11/19 130/78    Wt Readings from Last 3 Encounters:  09/06/19 278 lb 12.8 oz (126.5 kg)  08/05/19 267 lb (121.1 kg)  07/08/19 267 lb (121.1 kg)    Physical Exam Psychiatric:        Attention and Perception: Attention normal.        Mood and Affect: Mood normal.        Speech: Speech normal.        Behavior: Behavior is cooperative.        Thought Content: Thought content normal.        Cognition and Memory: Cognition and memory normal.        Judgment: Judgment normal.    Lab Results  Component Value Date   WBC 8.3 11/07/2018   HGB 11.8 (L) 11/07/2018   HCT 36.8 11/07/2018   PLT 331  11/07/2018   GLUCOSE 178 (H) 08/09/2019   CHOL 180 04/28/2019   TRIG 115.0 04/28/2019   HDL 35.70 (L) 04/28/2019   LDLDIRECT 125.0 12/15/2018   LDLCALC 121 (H) 04/28/2019   ALT 11 04/28/2019   AST 13 04/28/2019   NA 137 08/09/2019   K 4.3 08/09/2019   CL 105 08/09/2019   CREATININE 1.14 08/09/2019   BUN 17 08/09/2019   CO2 28 08/09/2019   TSH 15.21 (H) 08/09/2019   INR 1.20 11/02/2015   HGBA1C 7.0 (H) 08/09/2019   MICROALBUR 6.2 (H) 12/15/2018    Myocardial Perfusion Imaging  Result Date: 06/17/2019  The left ventricular ejection fraction is normal (55-65%).  Nuclear stress EF: 58%.  There was no ST segment deviation noted during stress.  Defect 1: There is a small defect of severe severity present in the apex location.  The study is normal.  This is a low risk study.  Normal stress nuclear study with apical thinning but no ischemia.  Gated ejection fraction 58% with normal wall motion.    Assessment & Plan:  This visit occurred during the SARS-CoV-2 public health emergency.  Safety protocols were in place, including screening questions prior to the visit, additional usage of staff PPE, and extensive cleaning of exam room while observing appropriate contact time as indicated for disinfecting solutions.   Gail Barr was seen today for follow-up.  Diagnoses and all orders for this visit:  Current moderate episode of major depressive disorder without prior episode ([place])   I have discontinued Gail Barr's ALPRAZolam and venlafaxine. I am also having her maintain her Multiple Vitamins-Minerals (MULTIVITAL PO), Pen Needles, B-D UF III MINI PEN NEEDLES, glucose blood, Lancet Device, levothyroxine, Vitamin D (Ergocalciferol), rosuvastatin, Rybelsus, and zaleplon.  No orders of the defined types were placed in this encounter.   Problem List Items Addressed This Visit      Other   Depression, major, single episode, moderate ([place]) - Primary    Unable to tolerate effexor after  2doses (increase anxiety) She prefers not to take any additional medication at this time. She prefers to continue sessions with therapist and medication at this time.          Follow-up: Return in about 3 months (around 12/07/2019) for hyperlipidemia and depression (66mins).  Wilfred Lacy, NP

## 2019-09-06 NOTE — Assessment & Plan Note (Signed)
Unable to tolerate effexor after 2doses (increase anxiety) She prefers not to take any additional medication at this time. She prefers to continue sessions with therapist and medication at this time.

## 2019-09-06 NOTE — Patient Instructions (Addendum)
Read Drivers is for insomnia prn Continue sessions with therapist. Let me know if you change your mind about using another medication. schedula annual diabetic eye exam. Have report faxed to me.

## 2019-09-12 ENCOUNTER — Ambulatory Visit (INDEPENDENT_AMBULATORY_CARE_PROVIDER_SITE_OTHER): Payer: BC Managed Care – PPO | Admitting: Psychologist

## 2019-09-12 DIAGNOSIS — F4323 Adjustment disorder with mixed anxiety and depressed mood: Secondary | ICD-10-CM | POA: Diagnosis not present

## 2019-09-12 DIAGNOSIS — Z634 Disappearance and death of family member: Secondary | ICD-10-CM

## 2019-10-12 ENCOUNTER — Ambulatory Visit: Payer: BC Managed Care – PPO | Admitting: Psychologist

## 2019-10-13 ENCOUNTER — Other Ambulatory Visit: Payer: Self-pay | Admitting: Endocrinology

## 2019-10-17 ENCOUNTER — Other Ambulatory Visit: Payer: Self-pay | Admitting: Obstetrics and Gynecology

## 2019-10-17 DIAGNOSIS — R928 Other abnormal and inconclusive findings on diagnostic imaging of breast: Secondary | ICD-10-CM

## 2019-10-21 ENCOUNTER — Other Ambulatory Visit: Payer: Self-pay | Admitting: Obstetrics and Gynecology

## 2019-10-21 ENCOUNTER — Other Ambulatory Visit: Payer: Self-pay

## 2019-10-21 ENCOUNTER — Ambulatory Visit
Admission: RE | Admit: 2019-10-21 | Discharge: 2019-10-21 | Disposition: A | Discharge status: Home / Self Care | Payer: BC Managed Care – PPO | Source: Ambulatory Visit | Attending: Obstetrics and Gynecology | Admitting: Obstetrics and Gynecology

## 2019-10-21 DIAGNOSIS — R921 Mammographic calcification found on diagnostic imaging of breast: Secondary | ICD-10-CM

## 2019-10-21 DIAGNOSIS — R928 Other abnormal and inconclusive findings on diagnostic imaging of breast: Secondary | ICD-10-CM

## 2019-12-07 ENCOUNTER — Ambulatory Visit: Payer: BC Managed Care – PPO | Admitting: Nurse Practitioner

## 2019-12-26 ENCOUNTER — Telehealth: Payer: Self-pay | Admitting: Nurse Practitioner

## 2019-12-26 NOTE — Telephone Encounter (Signed)
Reached out to patient regarding cancelled appt from 12/07/19. Patient stated she will call back to reschedule.

## 2020-01-29 ENCOUNTER — Other Ambulatory Visit: Payer: Self-pay | Admitting: Endocrinology

## 2020-03-09 ENCOUNTER — Encounter: Payer: Self-pay | Admitting: Nurse Practitioner

## 2020-03-09 DIAGNOSIS — R300 Dysuria: Secondary | ICD-10-CM

## 2020-03-09 NOTE — Telephone Encounter (Signed)
Please see message and advise.  Thank you. Is it ok for pt to come in and leave a urine sample?

## 2020-03-09 NOTE — Telephone Encounter (Signed)
Pt called back and informed office that she will go to an Urgent Care today, as she would not be here by 5pm today.

## 2020-03-09 NOTE — Telephone Encounter (Signed)
lft VM to rtn call. Dm/cma  

## 2020-03-16 ENCOUNTER — Other Ambulatory Visit: Payer: Self-pay | Admitting: Endocrinology

## 2020-03-21 ENCOUNTER — Other Ambulatory Visit: Payer: Self-pay

## 2020-03-21 ENCOUNTER — Other Ambulatory Visit: Payer: Self-pay | Admitting: Endocrinology

## 2020-03-21 ENCOUNTER — Other Ambulatory Visit (INDEPENDENT_AMBULATORY_CARE_PROVIDER_SITE_OTHER): Payer: BC Managed Care – PPO

## 2020-03-21 DIAGNOSIS — E1165 Type 2 diabetes mellitus with hyperglycemia: Secondary | ICD-10-CM

## 2020-03-21 DIAGNOSIS — E039 Hypothyroidism, unspecified: Secondary | ICD-10-CM | POA: Diagnosis not present

## 2020-03-21 LAB — COMPREHENSIVE METABOLIC PANEL
ALT: 11 U/L (ref 0–35)
AST: 12 U/L (ref 0–37)
Albumin: 4.3 g/dL (ref 3.5–5.2)
Alkaline Phosphatase: 52 U/L (ref 39–117)
BUN: 17 mg/dL (ref 6–23)
CO2: 27 mEq/L (ref 19–32)
Calcium: 9.3 mg/dL (ref 8.4–10.5)
Chloride: 104 mEq/L (ref 96–112)
Creatinine, Ser: 1.07 mg/dL (ref 0.40–1.20)
GFR: 59.72 mL/min — ABNORMAL LOW (ref >60.00)
Glucose, Bld: 199 mg/dL — ABNORMAL HIGH (ref 70–99)
Potassium: 4.3 mEq/L (ref 3.5–5.1)
Sodium: 137 mEq/L (ref 135–145)
Total Bilirubin: 0.5 mg/dL (ref 0.2–1.2)
Total Protein: 7.6 g/dL (ref 6.0–8.3)

## 2020-03-21 LAB — LIPID PANEL
Cholesterol: 196 mg/dL (ref 0–200)
HDL: 38.8 mg/dL — ABNORMAL LOW (ref >39.00)
LDL Cholesterol: 127 mg/dL — ABNORMAL HIGH (ref 0–99)
NonHDL: 157.22
Total CHOL/HDL Ratio: 5
Triglycerides: 153 mg/dL — ABNORMAL HIGH (ref 0.0–149.0)
VLDL: 30.6 mg/dL (ref 0.0–40.0)

## 2020-03-21 LAB — HEMOGLOBIN A1C: Hgb A1c MFr Bld: 7.7 % — ABNORMAL HIGH (ref 4.6–6.5)

## 2020-03-21 LAB — MICROALBUMIN / CREATININE URINE RATIO
Creatinine,U: 96.6 mg/dL
Microalb Creat Ratio: 18.7 mg/g (ref 0.0–30.0)
Microalb, Ur: 18.1 mg/dL — ABNORMAL HIGH (ref 0.0–1.9)

## 2020-03-21 LAB — T4, FREE: Free T4: 0.49 ng/dL — ABNORMAL LOW (ref 0.60–1.60)

## 2020-03-21 LAB — TSH: TSH: 11.83 u[IU]/mL — ABNORMAL HIGH (ref 0.35–4.50)

## 2020-03-23 ENCOUNTER — Other Ambulatory Visit: Payer: Self-pay | Admitting: Endocrinology

## 2020-03-26 ENCOUNTER — Other Ambulatory Visit: Payer: Self-pay

## 2020-03-26 ENCOUNTER — Other Ambulatory Visit: Payer: Self-pay | Admitting: *Deleted

## 2020-03-26 ENCOUNTER — Encounter: Payer: Self-pay | Admitting: Endocrinology

## 2020-03-26 ENCOUNTER — Ambulatory Visit (INDEPENDENT_AMBULATORY_CARE_PROVIDER_SITE_OTHER): Payer: BC Managed Care – PPO | Admitting: Endocrinology

## 2020-03-26 VITALS — BP 138/88 | HR 74 | Ht 68.0 in | Wt 269.6 lb

## 2020-03-26 DIAGNOSIS — E039 Hypothyroidism, unspecified: Secondary | ICD-10-CM

## 2020-03-26 DIAGNOSIS — E1165 Type 2 diabetes mellitus with hyperglycemia: Secondary | ICD-10-CM

## 2020-03-26 DIAGNOSIS — E7800 Pure hypercholesterolemia, unspecified: Secondary | ICD-10-CM | POA: Diagnosis not present

## 2020-03-26 MED ORDER — FLUVASTATIN SODIUM ER 80 MG PO TB24: 80.0000 mg | ORAL_TABLET | Freq: Every day | ORAL | 2 refills | Status: DC

## 2020-03-26 MED ORDER — OZEMPIC (0.25 OR 0.5 MG/DOSE) 2 MG/1.5ML ~~LOC~~ SOPN: 0.5000 mg | PEN_INJECTOR | SUBCUTANEOUS | 0 refills | Status: DC

## 2020-03-26 MED ORDER — ONETOUCH DELICA LANCETS 33G MISC: 3 refills | Status: DC

## 2020-03-26 MED ORDER — LEVOTHYROXINE SODIUM 200 MCG PO TABS: 200.0000 ug | ORAL_TABLET | Freq: Every day | ORAL | 0 refills | Status: DC

## 2020-03-26 MED ORDER — ONETOUCH VERIO VI STRP: 1.0000 | ORAL_STRIP | 3 refills | Status: DC | PRN

## 2020-03-26 NOTE — Progress Notes (Signed)
Patient ID: Gail Barr, female   DOB: Jul 11, 1965, 55 y.o.   MRN: [id number]          Reason for Appointment: Endocrinology follow-up  Referring physician: Wilfred Lacy   History of Present Illness:          Date of diagnosis of type 2 diabetes mellitus:  2015    Background history:   She had an upper normal A1c of 6.5 in 2014 and then subsequently A1c was in the diabetic range Appears that her blood sugars have been poorly controlled with progressive rise in A1c Follow-up for diabetes has been infrequent and she has not had a consistent primary care provider also She had been treated with metformin initially Subsequently had been on Farxiga in 2016 and also Invokamet subsequently  In 2/19 she had marked hyperglycemia at the emergency room when seen for headache and blurred vision with glucose of 539.  Recent history:        Non-insulin hypoglycemic drugs: Rybelsus 7 mg   A1c  is 7.7, was 6.5 on her last visit  Current management, blood sugar patterns and problems identified:  She had been on 7 mg Rybelsus since her visit in 11/2018  However she has been quite irregular with both her follow-up and her medications  She does not think she has taken her medication in probably a month  However with taking her medication in the morning empty stomach more recently she did not have any further nausea  With this her blood sugars are significantly higher  She is using the [place] meter  As usual her morning sugars tend to be higher also  Has not been exercising regularly  Because of stress and other issues she has had variable weight last year        Side effects from medications have been: Diarrhea, abdominal pain from metformin   Glucose monitoring:  As below       Glucometer: One Touch Verio/Livongo.       Blood Glucose readings    PRE-MEAL Fasting Lunch Dinner Bedtime Overall  Glucose range:  185, 199  184  164-201    Mean/median:        Previous  readings:  PRE-MEAL Fasting Lunch Dinner  evening Overall  Glucose range:  139-229   148, 159  86-177   Mean/median:  160    152  153     Self-care: The diet that the patient has been following is: tries to limit high-fat foods.     Typical meal intake: Breakfast is eggs with spinach Lunch usually [place] salad and pita bread.  Dinner is rice or pasta dishes or a small snack               Dietician visit, most recent: 1/20  Weight history:  Wt Readings from Last 3 Encounters:  03/26/20 269 lb 9.6 oz (122.3 kg)  09/06/19 278 lb 12.8 oz (126.5 kg)  08/05/19 267 lb (121.1 kg)   Glycemic control:   Lab Results  Component Value Date   HGBA1C 7.7 (H) 03/21/2020   HGBA1C 7.0 (H) 08/09/2019   HGBA1C 6.5 04/28/2019   Lab Results  Component Value Date   MICROALBUR 18.1 (H) 03/21/2020   LDLCALC 127 (H) 03/21/2020   CREATININE 1.07 03/21/2020   Lab Results  Component Value Date   MICRALBCREAT 18.7 03/21/2020    Lab Results  Component Value Date   FRUCTOSAMINE 258 05/14/2018   FRUCTOSAMINE 277 07/09/2017    Problem 2:  HYPOTHYROIDISM  She was diagnosed to have hypothyroidism soon after her pregnancy around the year 1995 At that time she had a swelling in her thyroid and no other significant symptoms She was started on thyroid supplements and has been on these and variable doses over the last several years More recently she has been taking mostly 200 mcg She thinks that when her thyroid levels are lower when she is not taking her levothyroxine regularly she only has some difficulty with remembering things and some thinning of her hair but usually no cold intolerance or unusual fatigue She also tends to have more constipation  Although she was initially started on 200 mcg on 04/24/2017 because of her TSH being higher 2 weeks later another 50 mcg was added  TSH was still high when she was taking separate prescriptions for 200 and 50 mcg tablets  Since 12/19 she has been  prescribed a 300 mcg tablet of levothyroxine  She has been very irregular with taking this She says she does not feel any different with or without the medication and she has difficulty focusing both ways For the last several weeks she has not taken any levothyroxine Surprisingly her TSH is lower than in 5/21  No unusual fatigue  GOITER: She has had a goiter for several years and her ultrasound shows only heterogenous tissue and no nodules    Lab Results  Component Value Date   TSH 11.83 (H) 03/21/2020   TSH 15.21 (H) 08/09/2019   TSH 7.69 (H) 04/28/2019   FREET4 0.49 (L) 03/21/2020   FREET4 0.50 (L) 08/09/2019   FREET4 0.46 (L) 04/28/2019       Allergies as of 03/26/2020      Reactions   Betadine [povidone Iodine]    Sob, swelling   Ibuprofen Shortness Of Breath, Swelling, Anaphylaxis   And chest pain.  Tolerates naproxen without problems   Iodinated Diagnostic Agents Anaphylaxis   Iodine Anaphylaxis   Iohexol     Desc: Pt states [place] 5 [place]rs [place] and experienced severe sob and swelling. We did her scan w/o iv contrast today.        Thanks., Onset Date: [id number]      Medication List       Accurate as of March 26, 2020  9:47 AM. If you have any questions, ask your nurse or doctor.        STOP taking these medications   B-D UF III MINI PEN NEEDLES 31G X 5 MM Misc Generic drug: Insulin Pen Needle Stopped by: Elayne Snare, MD   Pen Needles 31G X 6 MM Misc Stopped by: Elayne Snare, MD   rosuvastatin 20 MG tablet Commonly known as: CRESTOR Stopped by: Elayne Snare, MD   Rybelsus 7 MG Tabs Generic drug: Semaglutide Replaced by: Ozempic (0.25 or 0.5 MG/DOSE) 2 MG/1.5ML Sopn Stopped by: Elayne Snare, MD   zaleplon 5 MG capsule Commonly known as: Sonata Stopped by: Elayne Snare, MD     TAKE these medications   fluvastatin XL 80 MG 24 hr tablet Commonly known as: LESCOL XL Take 1 tablet (80 mg total) by mouth daily. Started by: Elayne Snare,  MD   Lancet Device Misc Check blood glucose 4 time daily (before meals and at bed time). E11.29. One touch Veiro meter. What changed: additional instructions   levothyroxine 200 MCG tablet Commonly known as: Synthroid Take 1 tablet (200 mcg total) by mouth daily before breakfast. What changed:   medication  strength  See the new instructions. Changed by: Reather Littler, MD   MULTIVITAL PO Take by mouth as needed.   OneTouch Delica Lancets 33G Misc Use to check blood sugar once a day What changed:   how to take this  additional instructions Changed by: PEDRAZA, RHONDA, CMA   OneTouch Verio test strip Generic drug: glucose blood 1 each by Other route as needed for other. Use as instructed to check blood sugar once a day What changed:   additional instructions  Another medication with the same name was removed. Continue taking this medication, and follow the directions you see here. Changed by: Valora Piccolo, CMA   Ozempic (0.25 or 0.5 MG/DOSE) 2 MG/1.5ML Sopn Generic drug: Semaglutide(0.25 or 0.5MG /DOS) Inject 0.5 mg into the skin once a week. Start with 0.25mg  weekly for 3 weeks Replaces: Rybelsus 7 MG Tabs Started by: Reather Littler, MD   Vitamin D (Ergocalciferol) 1.25 MG (50000 UNIT) Caps capsule Commonly known as: DRISDOL Take 1 capsule (50,000 Units total) by mouth every 7 (seven) days.       Allergies:  Allergies  Allergen Reactions  . Betadine [Povidone Iodine]     Sob, swelling  . Ibuprofen Shortness Of Breath, Swelling and Anaphylaxis    And chest pain.  Tolerates naproxen without problems  . Iodinated Diagnostic Agents Anaphylaxis  . Iodine Anaphylaxis  . Iohexol      Desc: Pt states she had a CT out of [place] approx 5 yrs ago and experienced severe sob and swelling. We did her scan w/o iv contrast today.        Thanks., Onset Date: [id number]     Past Medical History:  Diagnosis Date  . Abnormal Pap smear early 2000's   had colposcopy, no additional  treatment. normal paps since  . Chronic kidney disease 2018   one kidney  . Diabetes mellitus without complication (HCC)   . Glaucoma    no eye drops  . History of chickenpox   . Hypothyroidism 1993  . Multiple thyroid nodules   . Pyelonephritis 1993   when pregnant; subsequently found to have shrunken, nonfunctioning kidney on one side  . Unilateral renal atrophy     Past Surgical History:  Procedure Laterality Date  . DILITATION & CURRETTAGE/HYSTROSCOPY WITH NOVASURE ABLATION N/A 03/02/2013   Procedure: DILATATION & CURETTAGE/HYSTEROSCOPY WITH NOVASURE ABLATION;  Surgeon: Levi Aland, MD;  Location: [facility];  Service: Gynecology;  Laterality: N/A;    Family History  Problem Relation Age of Onset  . Asthma Brother   . Diabetes Brother   . Diabetes Maternal Aunt   . Diabetes Maternal Uncle   . Diabetes Maternal Grandmother   . Cancer Maternal Grandfather        prostate cancer  . Colon cancer Neg Hx   . Rectal cancer Neg Hx   . Stomach cancer Neg Hx     Social History:  reports that she has never smoked. She has never used smokeless tobacco. She reports current alcohol use. She reports that she does not use drugs.   Review of Systems   Lipid history: Her baseline LDL has been high, last 125 when she was told to take pravastatin However she she was having nausea and stopped it She apparently also had nausea with Crestor which was tried in 01/2019  Her lipids are variable as follows but LDL appears to be consistently high    Lab Results  Component Value Date   CHOL 196 03/21/2020   CHOL  180 04/28/2019   CHOL 226 (H) 12/15/2018   Lab Results  Component Value Date   HDL 38.80 (L) 03/21/2020   HDL 35.70 (L) 04/28/2019   HDL 34.10 (L) 12/15/2018   Lab Results  Component Value Date   LDLCALC 127 (H) 03/21/2020   LDLCALC 121 (H) 04/28/2019   LDLCALC 125 (H) 02/26/2018   Lab Results  Component Value Date   TRIG 153.0 (H) 03/21/2020   TRIG 115.0 04/28/2019    TRIG 211.0 (H) 12/15/2018   Lab Results  Component Value Date   CHOLHDL 5 03/21/2020   CHOLHDL 5 04/28/2019   CHOLHDL 7 12/15/2018   Lab Results  Component Value Date   LDLDIRECT 125.0 12/15/2018   LDLDIRECT 102.0 05/14/2018            Hypertension: Blood pressure is usually normal without medications  Previously given ramipril  BP Readings from Last 3 Encounters:  03/26/20 138/88  09/06/19 117/72  05/30/19 134/85    Most recent eye exam was in 3/19   She refuses influenza vaccine  She is asking about the need for Covid booster shot   LABS:  Appointment on 03/21/2020  Component Date Value Ref Range Status  . Free T4 03/21/2020 0.49* 0.60 - 1.60 ng/dL Final   Comment: Specimens from patients who are undergoing biotin therapy and /or ingesting biotin supplements may contain high levels of biotin.  The higher biotin concentration in these specimens interferes with this Free T4 assay.  Specimens that contain high levels  of biotin may cause false high results for this Free T4 assay.  Please interpret results in light of the total clinical presentation of the patient.    Marland Kitchen TSH 03/21/2020 11.83* 0.35 - 4.50 uIU/mL Final  . Cholesterol 03/21/2020 196  0 - 200 mg/dL Final   ATP III Classification       Desirable:  < 200 mg/dL               Borderline High:  200 - 239 mg/dL          High:  > = 240 mg/dL  . Triglycerides 03/21/2020 153.0* 0.0 - 149.0 mg/dL Final   Normal:  <150 mg/dLBorderline High:  150 - 199 mg/dL  . HDL 03/21/2020 38.80* >39.00 mg/dL Final  . VLDL 03/21/2020 30.6  0.0 - 40.0 mg/dL Final  . LDL Cholesterol 03/21/2020 127* 0 - 99 mg/dL Final  . Total CHOL/HDL Ratio 03/21/2020 5   Final                  Men          Women1/2 Average Risk     3.4          3.3Average Risk          5.0          4.42X Average Risk          9.6          7.13X Average Risk          15.0          11.0                      . NonHDL 03/21/2020 157.22   Final   NOTE:  Non-HDL goal  should be 30 mg/dL higher than patient's LDL goal (i.e. LDL goal of < 70 mg/dL, would have non-HDL goal of < 100 mg/dL)  . Microalb, Ur 03/21/2020 18.1* 0.0 -  1.9 mg/dL Final  . Creatinine,U 03/21/2020 96.6  mg/dL Final  . Microalb Creat Ratio 03/21/2020 18.7  0.0 - 30.0 mg/g Final  . Sodium 03/21/2020 137  135 - 145 mEq/L Final  . Potassium 03/21/2020 4.3  3.5 - 5.1 mEq/L Final  . Chloride 03/21/2020 104  96 - 112 mEq/L Final  . CO2 03/21/2020 27  19 - 32 mEq/L Final  . Glucose, Bld 03/21/2020 199* 70 - 99 mg/dL Final  . BUN 03/21/2020 17  6 - 23 mg/dL Final  . Creatinine, Ser 03/21/2020 1.07  0.40 - 1.20 mg/dL Final  . Total Bilirubin 03/21/2020 0.5  0.2 - 1.2 mg/dL Final  . Alkaline Phosphatase 03/21/2020 52  39 - 117 U/L Final  . AST 03/21/2020 12  0 - 37 U/L Final  . ALT 03/21/2020 11  0 - 35 U/L Final  . Total Protein 03/21/2020 7.6  6.0 - 8.3 g/dL Final  . Albumin 03/21/2020 4.3  3.5 - 5.2 g/dL Final  . GFR 03/21/2020 59.72* >60.00 mL/min Final   Calculated using the CKD-EPI Creatinine Equation (2021)  . Calcium 03/21/2020 9.3  8.4 - 10.5 mg/dL Final  . Hgb A1c MFr Bld 03/21/2020 7.7* 4.6 - 6.5 % Final   Glycemic Control Guidelines for People with Diabetes:Non Diabetic:  <6%Goal of Therapy: <7%Additional Action Suggested:  >8%     Physical Examination:  BP 138/88   Pulse 74   Ht 5\' 8"  (1.727 m)   Wt 269 lb 9.6 oz (122.3 kg)   SpO2 98%   BMI 40.99 kg/m      ASSESSMENT:  Diabetes type 2, current BMI 41  See history of present illness for detailed discussion of current diabetes management, blood sugar patterns and problems identified  A1c is 7.7 compared to 6.5 on her last visit  Previously she reports good readings when she was taking Rybelsus but this could not be confirmed She also has had difficulty with stress and inconsistent exercise Blood sugars are averaging about 180 at home and fairly consistently high both morning and after evening meal  Previously  Ozempic was not covered and she thinks this helped her better Likely also tolerated this better than Rybelsus  HYPOTHYROIDISM: She has been prescribed 300 mcg of levothyroxine but she has not taken it Surprisingly her TSH is only about 11 She is only complaining about brain fog TSH consistently high in 2021   LIPIDS: She has had hypercholesterolemia but she reports nausea with both pravastatin and Crestor and has not taken any medication Discussed importance of risk reduction with her diabetes and lipid abnormality  Explained to her the need for Covid booster shots and she is due for 1  High normal blood pressure: We will follow as this is not consistent  PLAN:    She will be prescribed Ozempic again and co-pay card given  If this is covered she will start with 0.25 for the first 3 weeks and then 0.5, demonstrated the dosage on a demonstration pen  She will call if she has any nausea  Consistent diet and exercise  Restart using the One Touch Verio monitor  Discussed blood sugar targets before and after meals  Regular visits for diabetes   Encouraged her to get the Covid booster shot right away  Trial of Lescol XL to see if this causes less nausea  Recheck labs in 3 months  She will trial 200 mcg of levothyroxine every day and may consider using combination of levothyroxine/liothyronine if she  is symptomatically not better  Patient Instructions  Check blood sugars on waking up 2-3 days a week  Also check blood sugars about 2 hours after meals and do this after different meals by rotation  Recommended blood sugar levels on waking up are 90-130 and about 2 hours after meal is 130-160  Please bring your blood sugar monitor to each visit, thank you  Start OZEMPIC injections by dialing 0.25 mg on the pen as shown once weekly on the same day of the week.   You may inject in the sides of the stomach, outer thigh or arm as indicated in the brochure given. If you have any  difficulties using the pen see the video at CompPlans.[email or web address].za  You will feel fullness of the stomach with starting the medication and should try to keep the portions at meals small.  You may experience nausea in the first few days which usually gets better over time    After 3-4 weeks increase the dose to 0.5 mg weekly  If you have any questions or persistent side effects please call the office   You may also talk to a nurse educator with [employer] at [phone] Useful website: [email or web address]             Elayne Snare 03/26/2020, 9:47 AM   Note: This office note was prepared with Dragon voice recognition system technology. Any transcriptional errors that result from this process are unintentional.

## 2020-03-26 NOTE — Patient Instructions (Addendum)
Check blood sugars on waking up 2-3 days a week  Also check blood sugars about 2 hours after meals and do this after different meals by rotation  Recommended blood sugar levels on waking up are 90-130 and about 2 hours after meal is 130-160  Please bring your blood sugar monitor to each visit, thank you  Start OZEMPIC injections by dialing 0.25 mg on the pen as shown once weekly on the same day of the week.   You may inject in the sides of the stomach, outer thigh or arm as indicated in the brochure given. If you have any difficulties using the pen see the video at [email or web address]  You will feel fullness of the stomach with starting the medication and should try to keep the portions at meals small.  You may experience nausea in the first few days which usually gets better over time    After 3-4 weeks increase the dose to 0.5 mg weekly  If you have any questions or persistent side effects please call the office   You may also talk to a nurse educator with [employer] at [phone] Useful website: [email or web address]

## 2020-04-19 ENCOUNTER — Other Ambulatory Visit: Payer: Self-pay | Admitting: Endocrinology

## 2020-04-24 ENCOUNTER — Ambulatory Visit
Admission: RE | Admit: 2020-04-24 | Discharge: 2020-04-24 | Disposition: A | Discharge status: Home / Self Care | Payer: BC Managed Care – PPO | Source: Ambulatory Visit | Attending: Obstetrics and Gynecology | Admitting: Obstetrics and Gynecology

## 2020-04-24 ENCOUNTER — Other Ambulatory Visit: Payer: Self-pay | Admitting: Obstetrics and Gynecology

## 2020-04-24 ENCOUNTER — Other Ambulatory Visit: Payer: Self-pay

## 2020-04-24 DIAGNOSIS — R921 Mammographic calcification found on diagnostic imaging of breast: Secondary | ICD-10-CM

## 2020-05-11 ENCOUNTER — Emergency Department (HOSPITAL_BASED_OUTPATIENT_CLINIC_OR_DEPARTMENT_OTHER)
Admit: 2020-05-11 | Discharge: 2020-05-11 | Disposition: A | Discharge status: Home / Self Care | Payer: BC Managed Care – PPO | Attending: Emergency Medicine | Admitting: Emergency Medicine

## 2020-05-11 ENCOUNTER — Encounter (HOSPITAL_COMMUNITY): Payer: Self-pay | Admitting: Emergency Medicine

## 2020-05-11 ENCOUNTER — Emergency Department (HOSPITAL_COMMUNITY): Payer: BC Managed Care – PPO

## 2020-05-11 ENCOUNTER — Emergency Department (HOSPITAL_COMMUNITY)
Admission: EM | Admit: 2020-05-11 | Discharge: 2020-05-11 | Disposition: A | Discharge status: Home / Self Care | Payer: BC Managed Care – PPO | Attending: Emergency Medicine | Admitting: Emergency Medicine

## 2020-05-11 ENCOUNTER — Other Ambulatory Visit: Payer: Self-pay

## 2020-05-11 DIAGNOSIS — H538 Other visual disturbances: Secondary | ICD-10-CM | POA: Insufficient documentation

## 2020-05-11 DIAGNOSIS — M7989 Other specified soft tissue disorders: Secondary | ICD-10-CM

## 2020-05-11 DIAGNOSIS — Z79899 Other long term (current) drug therapy: Secondary | ICD-10-CM | POA: Diagnosis not present

## 2020-05-11 DIAGNOSIS — R0789 Other chest pain: Secondary | ICD-10-CM | POA: Insufficient documentation

## 2020-05-11 DIAGNOSIS — N189 Chronic kidney disease, unspecified: Secondary | ICD-10-CM | POA: Diagnosis not present

## 2020-05-11 DIAGNOSIS — R42 Dizziness and giddiness: Secondary | ICD-10-CM | POA: Diagnosis not present

## 2020-05-11 DIAGNOSIS — M546 Pain in thoracic spine: Secondary | ICD-10-CM | POA: Insufficient documentation

## 2020-05-11 DIAGNOSIS — R110 Nausea: Secondary | ICD-10-CM | POA: Insufficient documentation

## 2020-05-11 DIAGNOSIS — E1122 Type 2 diabetes mellitus with diabetic chronic kidney disease: Secondary | ICD-10-CM | POA: Diagnosis not present

## 2020-05-11 DIAGNOSIS — E039 Hypothyroidism, unspecified: Secondary | ICD-10-CM | POA: Insufficient documentation

## 2020-05-11 LAB — BASIC METABOLIC PANEL
Anion gap: 9 (ref 5–15)
BUN: 11 mg/dL (ref 6–20)
CO2: 23 mmol/L (ref 22–32)
Calcium: 9.4 mg/dL (ref 8.9–10.3)
Chloride: 104 mmol/L (ref 98–111)
Creatinine, Ser: 1.01 mg/dL — ABNORMAL HIGH (ref 0.44–1.00)
GFR, Estimated: >60 mL/min (ref >60)
Glucose, Bld: 149 mg/dL — ABNORMAL HIGH (ref 70–99)
Potassium: 3.9 mmol/L (ref 3.5–5.1)
Sodium: 136 mmol/L (ref 135–145)

## 2020-05-11 LAB — CBC
HCT: 40.8 % (ref 36.0–46.0)
Hemoglobin: 13.6 g/dL (ref 12.0–15.0)
MCH: 27.3 pg (ref 26.0–34.0)
MCHC: 33.3 g/dL (ref 30.0–36.0)
MCV: 81.8 fL (ref 80.0–100.0)
Platelets: 367 10*3/uL (ref 150–400)
RBC: 4.99 MIL/uL (ref 3.87–5.11)
RDW: 15.5 % (ref 11.5–15.5)
WBC: 7.6 10*3/uL (ref 4.0–10.5)
nRBC: 0 % (ref 0.0–0.2)

## 2020-05-11 LAB — TROPONIN I (HIGH SENSITIVITY)
Troponin I (High Sensitivity): 4 ng/L (ref <18)
Troponin I (High Sensitivity): 5 ng/L (ref <18)

## 2020-05-11 NOTE — ED Triage Notes (Signed)
  Pt arrives from work where she had sudden episode of left sided chest pain radiated into her upper back and felt nausea, this last 3-4 minutes now just has dull ache.

## 2020-05-11 NOTE — ED Provider Notes (Signed)
[facility] Provider Note   CSN: [id number] Arrival date & time: 05/11/20  1118     History Chief Complaint  Patient presents with  . Chest Pain    Gail Barr is a 55 y.o. female.  Patient states that she had sudden onset of chest pain and shortness of breath this afternoon while working at [employer].  She was sitting at her computer at the time.  Pain radiated to her upper back and down her left arm.  She also experienced nausea without vomiting and dizziness.  Also complained of blurred vision at the time.  All of the symptoms are now currently improving.  States her chest pain is now a "5 out of 10".  Denies diplopia, diaphoresis, cough, abdominal pain or diarrhea.  Patient states a few days ago her left leg swelled up.  She soaked in some Epsom salt and the swelling has resolved but she states it still is tender palpation.  She has not gone any long car rides recently but he sits a lot during work.  No history of cancer.  No OCP use.  She has not had any major surgeries.  Past medical history is significant for diabetes.  Does not smoke.  Drinks wine occasionally.  No drug use.  A few years ago the patient had similar episodes of shortness of breath and hand numbness.  Numbness in hand lasted 4 weeks.  This year around the time she was diagnosed with diabetes.        Past Medical History:  Diagnosis Date  . Abnormal Pap smear early 2000's   had colposcopy, no additional treatment. normal paps since  . Chronic kidney disease 2018   one kidney  . Diabetes mellitus without complication ([place])   . Glaucoma    no eye drops  . History of chickenpox   . Hypothyroidism 1993  . Multiple thyroid nodules   . Pyelonephritis 1993   when pregnant; subsequently found to have shrunken, nonfunctioning kidney on one side  . Unilateral renal atrophy     Patient Active Problem List   Diagnosis Date Noted  . Depression, major, single episode, moderate  ([place]) 08/05/2019  . Adjustment insomnia 08/05/2019  . Chronic idiopathic constipation 12/15/2018  . Morbid obesity ([place]) 06/18/2018  . Irregular periods 06/18/2018  . Enlarged thyroid 04/29/2017  . Nausea with vomiting 12/11/2015  . 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 Dizziness 12/02/2015  . Mixed hyperlipidemia 02/12/2015  . Type 2 diabetes mellitus with microalbuminuria, without long-term current use of insulin ([place]) 04/04/2014  . Microalbuminuria 04/04/2014  . Chest pain 01/25/2014  . 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 Bifascicular block 01/25/2014  . Abnormal menses 01/10/2013  . Vitamin D deficiency 07/08/2011  . Hypothyroidism 07/07/2011    Past Surgical History:  Procedure Laterality Date  . DILITATION & CURRETTAGE/HYSTROSCOPY WITH NOVASURE ABLATION N/A 03/02/2013   Procedure: DILATATION & CURETTAGE/HYSTEROSCOPY WITH NOVASURE ABLATION;  Surgeon: Olga Millers, MD;  Location: [facility];  Service: Gynecology;  Laterality: N/A;     OB History    Gravida  6   Para  3   Term      Preterm      AB  3   Living  3     SAB  3   IAB      Ectopic      Multiple      Live Births  Family History  Problem Relation Age of Onset  . Asthma Brother   . Diabetes Brother   . Diabetes Maternal Aunt   . Diabetes Maternal Uncle   . Diabetes Maternal Grandmother   . Cancer Maternal Grandfather        prostate cancer  . Colon cancer Neg Hx   . Rectal cancer Neg Hx   . Stomach cancer Neg Hx     Social History   Tobacco Use  . Smoking status: Never Smoker  . Smokeless tobacco: Never Used  Vaping Use  . Vaping Use: Never used  Substance Use Topics  . Alcohol use: Yes    Comment: one drink every three months.  . Drug use: No    Home Medications Prior to Admission medications   Medication Sig Start Date End Date Taking? Authorizing Provider  fluvastatin XL (LESCOL XL) 80 MG 24 hr tablet Take 1 tablet (80 mg total) by mouth daily.  03/26/20   Elayne Snare, MD  glucose blood [facility] VERIO) test strip 1 each by Other route as needed for other. Use as instructed to check blood sugar once a day 03/26/20   Elayne Snare, MD  Lancet Device MISC Check blood glucose 4 time daily (before meals and at bed time). E11.29. One touch Veiro meter. Patient taking differently: Check blood glucose 3 time daily (before meals and at bed time). E11.29. One touch Veiro meter. 05/18/17   Nche, Charlene Brooke, NP  levothyroxine (SYNTHROID) 200 MCG tablet Take 1 tablet (200 mcg total) by mouth daily before breakfast. 03/26/20   Elayne Snare, MD  Multiple Vitamins-Minerals (MULTIVITAL PO) Take by mouth as needed.     [provider]  OneTouch Delica Lancets 39J MISC Use to check blood sugar once a day 03/26/20   Elayne Snare, MD  Semaglutide,0.25 or 0.5MG /DOS, (OZEMPIC, 0.25 OR 0.5 MG/DOSE,) 2 MG/1.5ML SOPN Inject 0.5 mg into the skin once a week. Start with 0.25mg  weekly for 3 weeks 03/26/20   Elayne Snare, MD  Vitamin D, Ergocalciferol, (DRISDOL) 1.25 MG (50000 UT) CAPS capsule Take 1 capsule (50,000 Units total) by mouth every 7 (seven) days. 12/16/18   Nche, Charlene Brooke, NP    Allergies    Betadine [povidone iodine], Ibuprofen, Iodinated diagnostic agents, Iodine, and Iohexol  Review of Systems   Review of Systems  All other systems reviewed and are negative.   Physical Exam Updated Vital Signs BP 118/65   Pulse 99   Temp 98 F (36.7 C)   Resp 18   Ht 5\' 8"  (1.727 m)   Wt 117.9 kg   SpO2 94%   BMI 39.53 kg/m   Physical Exam Vitals reviewed.  Constitutional:      General: She is not in acute distress.    Appearance: She is well-developed.  HENT:     Head: Normocephalic.  Cardiovascular:     Rate and Rhythm: Normal rate and regular rhythm.  No extrasystoles are present.    Heart sounds: Normal heart sounds. Heart sounds not distant. No murmur heard.  No systolic murmur is present.  No diastolic murmur is present.   Pulmonary:      Effort: Pulmonary effort is normal. No tachypnea.     Breath sounds: Normal breath sounds. No decreased breath sounds.  Chest:     Chest wall: No mass.  Abdominal:     General: Bowel sounds are normal.     Palpations: Abdomen is soft.     Tenderness: There is no abdominal  tenderness.  Musculoskeletal:     Cervical back: Normal range of motion.     Right lower leg: No tenderness. No edema.     Left lower leg: Tenderness present. No edema.  Skin:    General: Skin is warm and dry.  Neurological:     General: No focal deficit present.     Mental Status: She is alert and oriented to person, place, and time.     ED Results / Procedures / Treatments   Labs (all labs ordered are listed, but only abnormal results are displayed) Labs Reviewed  BASIC METABOLIC PANEL - Abnormal; Notable for the following components:      Result Value   Glucose, Bld 149 (*)    Creatinine, Ser 1.01 (*)    All other components within normal limits  CBC  TROPONIN I (HIGH SENSITIVITY)  TROPONIN I (HIGH SENSITIVITY)    EKG EKG Interpretation  Date/Time:  Friday May 11 2020 11:23:09 EST Ventricular Rate:  75 PR Interval:  134 QRS Duration: 136 QT Interval:  358 QTC Calculation: 399 R Axis:   -62 Text Interpretation: Sinus rhythm with marked sinus arrhythmia Right bundle branch block Left anterior fascicular block Bifasicular block Minimal voltage criteria for LVH, may be normal variant ( R in aVL ) Lateral infarct , age undetermined Abnormal ECG No significant change since last tracing Confirmed by Nanda Quinton [phone]) on 05/11/2020 11:41:55 AM   Radiology DG Chest 2 View  Result Date: 05/11/2020 CLINICAL DATA:  Chest pain. EXAM: CHEST - 2 VIEW COMPARISON:  Chest x-ray dated November 07, 2018. FINDINGS: The heart size and mediastinal contours are within normal limits. Both lungs are clear. The visualized skeletal structures are unremarkable. IMPRESSION: No active cardiopulmonary disease.  Electronically Signed   By: Titus Dubin M.D.   On: 05/11/2020 11:40   VAS [place] LOWER EXTREMITY VENOUS (DVT) ([facility] and [facility] 7a-7p)  Result Date: 05/11/2020  Lower Venous DVT Study Indications: Swelling.  Risk Factors: None identified. Limitations: Body habitus and poor ultrasound/tissue interface. Comparison Study: No prior studies. Performing Technologist: Oliver Hum RVT  Examination Guidelines: A complete evaluation includes B-mode imaging, spectral Doppler, color Doppler, and power Doppler as needed of all accessible portions of each vessel. Bilateral testing is considered an integral part of a complete examination. Limited examinations for reoccurring indications may be performed as noted. The reflux portion of the exam is performed with the patient in reverse Trendelenburg.  +-----+---------------+---------+-----------+----------+--------------+ RIGHTCompressibilityPhasicitySpontaneityPropertiesThrombus Aging +-----+---------------+---------+-----------+----------+--------------+ CFV  Full           Yes      Yes                                 +-----+---------------+---------+-----------+----------+--------------+   +---------+---------------+---------+-----------+----------+--------------+ LEFT     CompressibilityPhasicitySpontaneityPropertiesThrombus Aging +---------+---------------+---------+-----------+----------+--------------+ CFV      Full           Yes      Yes                                 +---------+---------------+---------+-----------+----------+--------------+ SFJ      Full                                                        +---------+---------------+---------+-----------+----------+--------------+  FV Prox  Full                                                        +---------+---------------+---------+-----------+----------+--------------+ FV Mid   Full                                                         +---------+---------------+---------+-----------+----------+--------------+ FV DistalFull                                                        +---------+---------------+---------+-----------+----------+--------------+ PFV      Full                                                        +---------+---------------+---------+-----------+----------+--------------+ POP      Full           Yes      Yes                                 +---------+---------------+---------+-----------+----------+--------------+ PTV      Full                                                        +---------+---------------+---------+-----------+----------+--------------+ PERO     Full                                                        +---------+---------------+---------+-----------+----------+--------------+     Summary: RIGHT: - No evidence of common femoral vein obstruction.  LEFT: - There is no evidence of deep vein thrombosis in the lower extremity.  - No cystic structure found in the popliteal fossa.  *See table(s) above for measurements and observations.    Preliminary     Procedures Procedures   Medications Ordered in ED Medications - No data to display  ED Course  I have reviewed the triage vital signs and the nursing notes.  Pertinent labs & imaging results that were available during my care of the patient were reviewed by me and considered in my medical decision making (see chart for details).    MDM Rules/Calculators/A&P                          Patient presents with sudden onset chest pain, shortness of breath, dizziness and nausea that started at work earlier today.  Symptoms currently improving.  Patient has a few instances of similar  episodes in the past.  Current EKG similar to EKG from last August.  Troponin less than 4.  CMP and CBC and CXR normal.  Unlikely to be ACS.  Will need to rule out PE given her recent leg swelling a few days prior to chest pain.  Getting  DVT ultrasound.   Update: ultrasound negative for DVT.  Patient now has 2 negative troponins.pain is improving. Given her negative workup and tenderness to palpation over the left chest wall pain is likely msk chest wall pain. Patient okay with discharging home.  Advised pt to take tylenol as needed and use heating pads on her chest for symptom relief.  Advised to f/u with pcp in next week.  Final Clinical Impression(s) / ED Diagnoses Final diagnoses:  Chest wall pain    Rx / DC Orders ED Discharge Orders    None       Benay Pike, MD 05/11/20 Raymon Mutton, MD 05/12/20 1243

## 2020-05-11 NOTE — Discharge Instructions (Signed)
All of your blood tests, EKGs, chest x-ray and ultrasound were negative for any serious pathology. You most likely have musculoskeletal chest wall pain causing your symptoms. You can take Tylenol and Aleve as needed. You can also use heating pads on the areas for symptomatic relief. Please follow-up with your primary care provider in the next week.

## 2020-05-11 NOTE — Progress Notes (Signed)
Left lower extremity venous duplex has been completed. Preliminary results can be found in CV Proc through chart review.  Results were given to Dr. Ralene Bathe.  05/11/20 5:19 PM Carlos Levering RVT

## 2020-06-18 ENCOUNTER — Other Ambulatory Visit: Payer: Self-pay | Admitting: Endocrinology

## 2020-06-24 ENCOUNTER — Other Ambulatory Visit: Payer: Self-pay | Admitting: Endocrinology

## 2020-06-25 ENCOUNTER — Other Ambulatory Visit: Payer: BC Managed Care – PPO

## 2020-06-28 ENCOUNTER — Other Ambulatory Visit: Payer: Self-pay | Admitting: Endocrinology

## 2020-06-28 ENCOUNTER — Ambulatory Visit: Payer: BC Managed Care – PPO | Admitting: Endocrinology

## 2020-07-09 NOTE — Telephone Encounter (Signed)
LMTCB to reschedule labs and follow up with Dr. Dwyane Dee

## 2020-09-23 ENCOUNTER — Other Ambulatory Visit: Payer: Self-pay | Admitting: Endocrinology

## 2020-09-25 ENCOUNTER — Other Ambulatory Visit: Payer: Self-pay

## 2020-09-25 ENCOUNTER — Other Ambulatory Visit (INDEPENDENT_AMBULATORY_CARE_PROVIDER_SITE_OTHER): Payer: BC Managed Care – PPO

## 2020-09-25 DIAGNOSIS — E039 Hypothyroidism, unspecified: Secondary | ICD-10-CM

## 2020-09-25 DIAGNOSIS — E7800 Pure hypercholesterolemia, unspecified: Secondary | ICD-10-CM

## 2020-09-25 DIAGNOSIS — E1165 Type 2 diabetes mellitus with hyperglycemia: Secondary | ICD-10-CM | POA: Diagnosis not present

## 2020-09-25 LAB — COMPREHENSIVE METABOLIC PANEL
ALT: 14 U/L (ref 0–35)
AST: 15 U/L (ref 0–37)
Albumin: 4.2 g/dL (ref 3.5–5.2)
Alkaline Phosphatase: 52 U/L (ref 39–117)
BUN: 11 mg/dL (ref 6–23)
CO2: 26 mEq/L (ref 19–32)
Calcium: 9.5 mg/dL (ref 8.4–10.5)
Chloride: 104 mEq/L (ref 96–112)
Creatinine, Ser: 1.09 mg/dL (ref 0.40–1.20)
GFR: 58.19 mL/min — ABNORMAL LOW (ref >60.00)
Glucose, Bld: 110 mg/dL — ABNORMAL HIGH (ref 70–99)
Potassium: 3.9 mEq/L (ref 3.5–5.1)
Sodium: 138 mEq/L (ref 135–145)
Total Bilirubin: 0.4 mg/dL (ref 0.2–1.2)
Total Protein: 7.9 g/dL (ref 6.0–8.3)

## 2020-09-25 LAB — LIPID PANEL
Cholesterol: 189 mg/dL (ref 0–200)
HDL: 35.5 mg/dL — ABNORMAL LOW (ref >39.00)
LDL Cholesterol: 128 mg/dL — ABNORMAL HIGH (ref 0–99)
NonHDL: 153.74
Total CHOL/HDL Ratio: 5
Triglycerides: 129 mg/dL (ref 0.0–149.0)
VLDL: 25.8 mg/dL (ref 0.0–40.0)

## 2020-09-25 LAB — TSH: TSH: 14.67 u[IU]/mL — ABNORMAL HIGH (ref 0.35–5.50)

## 2020-09-25 LAB — T4, FREE: Free T4: 0.44 ng/dL — ABNORMAL LOW (ref 0.60–1.60)

## 2020-09-25 LAB — HEMOGLOBIN A1C: Hgb A1c MFr Bld: 7.6 % — ABNORMAL HIGH (ref 4.6–6.5)

## 2020-09-28 ENCOUNTER — Ambulatory Visit (INDEPENDENT_AMBULATORY_CARE_PROVIDER_SITE_OTHER): Payer: BC Managed Care – PPO | Admitting: Endocrinology

## 2020-09-28 ENCOUNTER — Other Ambulatory Visit: Payer: Self-pay

## 2020-09-28 ENCOUNTER — Encounter: Payer: Self-pay | Admitting: Endocrinology

## 2020-09-28 VITALS — BP 128/76 | HR 93 | Ht 68.0 in | Wt 273.0 lb

## 2020-09-28 DIAGNOSIS — E7800 Pure hypercholesterolemia, unspecified: Secondary | ICD-10-CM

## 2020-09-28 DIAGNOSIS — E039 Hypothyroidism, unspecified: Secondary | ICD-10-CM | POA: Diagnosis not present

## 2020-09-28 DIAGNOSIS — E1165 Type 2 diabetes mellitus with hyperglycemia: Secondary | ICD-10-CM | POA: Diagnosis not present

## 2020-09-28 MED ORDER — EMPAGLIFLOZIN 10 MG PO TABS: 10.0000 mg | ORAL_TABLET | Freq: Every day | ORAL | 1 refills | Status: DC

## 2020-09-28 MED ORDER — OZEMPIC (1 MG/DOSE) 2 MG/1.5ML ~~LOC~~ SOPN: 1.0000 mg | PEN_INJECTOR | SUBCUTANEOUS | 0 refills | Status: DC

## 2020-09-28 NOTE — Patient Instructions (Signed)
Take 1/2 dose Ozempic for 2 weeks then 1mg  weekly

## 2020-09-28 NOTE — Progress Notes (Signed)
Patient ID: Gail Barr, female   DOB: 06/26/1965, 55 y.o.   MRN: [id number]          Reason for Appointment: Endocrinology follow-up  Referring physician: Wilfred Lacy   History of Present Illness:          Date of diagnosis of type 2 diabetes mellitus:  2015    Background history:   She had an upper normal A1c of 6.5 in 2014 and then subsequently A1c was in the diabetic range Appears that her blood sugars have been poorly controlled with progressive rise in A1c Follow-up for diabetes has been infrequent and she has not had a consistent primary care provider also She had been treated with metformin initially Subsequently had been on Farxiga in 2016 and also Invokamet subsequently  In 2/19 she had marked hyperglycemia at the emergency room when seen for headache and blurred vision with glucose of 539.  Recent history:        Non-insulin hypoglycemic drugs:   A1c  is 7.6 and about the same as before  Current management, blood sugar patterns and problems identified: She had been on 7 mg Rybelsus since her visit in 11/2018, now on Ozempic since her last visit in 1/22 For better control she was switched to [place] but not clear if this is doing any better She is checking her blood sugars more often and is fairly consistently above target at all times except on occasion with reading around 120 Blood sugars are higher postprandially  She thinks her blood sugars not controlled because of stress, not planning her meals and not exercising No nausea with Ozempic She says that her weight which had gone down slightly in February is back up higher than before Previously had tried [place] and [place] and unclear if she had any nausea with either of these        Side effects from medications have been: Diarrhea, abdominal pain from metformin   Glucose monitoring:  As below       Glucometer: One Touch Verio.       Blood Glucose readings from download recently   PRE-MEAL Fasting  Midday Dinner Bedtime Overall  Glucose range: 153-183   121-320 121-320  Mean/median: 172 156  220 183+/-50   Previously:   PRE-MEAL Fasting Lunch Dinner Bedtime Overall  Glucose range:  185, 199  184  164-201    Mean/median:        Previous readings:    Self-care: The diet that the patient has been following is: tries to limit high-fat foods.     Typical meal intake: Breakfast is eggs with spinach Lunch usually [place] salad and pita bread.  Dinner is rice or pasta dishes or a small snack               Dietician visit, most recent: 1/20  Weight history:  Wt Readings from Last 3 Encounters:  09/28/20 273 lb (123.8 kg)  05/11/20 260 lb (117.9 kg)  03/26/20 269 lb 9.6 oz (122.3 kg)   Glycemic control:   Lab Results  Component Value Date   HGBA1C 7.6 (H) 09/25/2020   HGBA1C 7.7 (H) 03/21/2020   HGBA1C 7.0 (H) 08/09/2019   Lab Results  Component Value Date   MICROALBUR 18.1 (H) 03/21/2020   LDLCALC 128 (H) 09/25/2020   CREATININE 1.09 09/25/2020   Lab Results  Component Value Date   MICRALBCREAT 18.7 03/21/2020    Lab Results  Component Value Date   FRUCTOSAMINE 258 05/14/2018  FRUCTOSAMINE 277 07/09/2017    Problem 2:  HYPOTHYROIDISM  She was diagnosed to have hypothyroidism soon after her pregnancy around the year 1995 At that time she had a swelling in her thyroid and no other significant symptoms She was started on thyroid supplements and has been on these and variable doses over the last several years More recently she has been taking mostly 200 mcg She thinks that when her thyroid levels are lower when she is not taking her levothyroxine regularly she only has some difficulty with remembering things and some thinning of her hair but usually no cold intolerance or unusual fatigue She also tends to have more constipation  Although she was initially started on 200 mcg on 04/24/2017 because of her TSH being higher 2 weeks later another 50 mcg was added  TSH  was still high when she was taking separate prescriptions for 200 and 50 mcg tablets  She continues to be persistently hypothyroid Although she was supposed to take 200 mcg of levothyroxine she has failed her old prescription of 300 mg and is taking only half tablet daily  Again she does not feel any different with or without the medication and she has difficulty focusing usually TSH is again higher as below  GOITER: She has had a goiter for several years and her ultrasound shows only heterogenous tissue and no nodules    Lab Results  Component Value Date   TSH 14.67 (H) 09/25/2020   TSH 11.83 (H) 03/21/2020   TSH 15.21 (H) 08/09/2019   FREET4 0.44 (L) 09/25/2020   FREET4 0.49 (L) 03/21/2020   FREET4 0.50 (L) 08/09/2019       Allergies as of 09/28/2020       Reactions   Betadine [povidone Iodine]    Sob, swelling   Ibuprofen Shortness Of Breath, Swelling, Anaphylaxis   And chest pain.  Tolerates naproxen without problems   Iodinated Diagnostic Agents Anaphylaxis   Iodine Anaphylaxis   Iohexol     Desc: Pt states [place] 5 [place] and experienced severe sob and swelling. We did her scan w/o iv contrast today.        Thanks., Onset Date: [id number]        Medication List        Accurate as of September 28, 2020 11:59 PM. If you have any questions, ask your nurse or doctor.          STOP taking these medications    Ozempic (0.25 or 0.5 MG/DOSE) 2 MG/1.5ML Sopn Generic drug: Semaglutide(0.25 or 0.5MG /DOS) Replaced by: Ozempic (1 MG/DOSE) 2 MG/1.5ML Sopn Stopped by: Elayne Snare, MD       TAKE these medications    empagliflozin 10 MG Tabs tablet Commonly known as: Jardiance Take 1 tablet (10 mg total) by mouth daily with breakfast. Started by: Elayne Snare, MD   fluvastatin XL 80 MG 24 hr tablet Commonly known as: LESCOL XL TAKE 1 TABLET BY MOUTH EVERY DAY   Lancet Device Misc Check blood glucose 4 time daily (before meals and at bed  time). E11.29. One touch Veiro meter. What changed: additional instructions   levothyroxine 200 MCG tablet Commonly known as: SYNTHROID TAKE 1 TABLET BY MOUTH EVERY DAY BEFORE BREAKFAST   MULTIVITAL PO Take by mouth as needed.   OneTouch Delica Lancets 57Q Misc Use to check blood sugar once a day   OneTouch Verio test strip Generic drug: glucose blood 1 each by Other route  as needed for other. Use as instructed to check blood sugar once a day   Ozempic (1 MG/DOSE) 2 MG/1.5ML Sopn Generic drug: Semaglutide (1 MG/DOSE) Inject 1 mg into the skin once a week. Replaces: Ozempic (0.25 or 0.5 MG/DOSE) 2 MG/1.5ML Sopn Started by: Elayne Snare, MD   Vitamin D (Ergocalciferol) 1.25 MG (50000 UNIT) Caps capsule Commonly known as: DRISDOL Take 1 capsule (50,000 Units total) by mouth every 7 (seven) days.        Allergies:  Allergies  Allergen Reactions   Betadine [Povidone Iodine]     Sob, swelling   Ibuprofen Shortness Of Breath, Swelling and Anaphylaxis    And chest pain.  Tolerates naproxen without problems   Iodinated Diagnostic Agents Anaphylaxis   Iodine Anaphylaxis   Iohexol      Desc: Pt states she [place] a CT [place] approx 5 yrs ago and experienced severe sob and swelling. We did her scan w/o iv contrast today.        Thanks., Onset Date: [id number]     Past Medical History:  Diagnosis Date   Abnormal Pap smear early 2000's   had colposcopy, no additional treatment. normal paps since   Chronic kidney disease 2018   one kidney   Diabetes mellitus without complication (HCC)    Glaucoma    no eye drops   History of chickenpox    Hypothyroidism 1993   Multiple thyroid nodules    Pyelonephritis 1993   when pregnant; subsequently found to have shrunken, nonfunctioning kidney on one side   Unilateral renal atrophy     Past Surgical History:  Procedure Laterality Date   DILITATION & CURRETTAGE/HYSTROSCOPY WITH NOVASURE ABLATION N/A 03/02/2013   Procedure:  DILATATION & CURETTAGE/HYSTEROSCOPY WITH NOVASURE ABLATION;  Surgeon: Olga Millers, MD;  Location: [facility];  Service: Gynecology;  Laterality: N/A;    Family History  Problem Relation Age of Onset   Asthma Brother    Diabetes Brother    Diabetes Maternal Aunt    Diabetes Maternal Uncle    Diabetes Maternal Grandmother    Cancer Maternal Grandfather        prostate cancer   Colon cancer Neg Hx    Rectal cancer Neg Hx    Stomach cancer Neg Hx     Social History:  reports that she has never smoked. She has never used smokeless tobacco. She reports current alcohol use. She reports that she does not use drugs.   Review of Systems   Lipid history: Her baseline LDL has been high, last 125 when she was told to take pravastatin However she she was having nausea and stopped it She apparently also had nausea with Crestor which was tried in 01/2019 Unclear if she is taking her fluvastatin regularly  Her lipids are variable as follows and LDL appears to be consistently high    Lab Results  Component Value Date   CHOL 189 09/25/2020   CHOL 196 03/21/2020   CHOL 180 04/28/2019   Lab Results  Component Value Date   HDL 35.50 (L) 09/25/2020   HDL 38.80 (L) 03/21/2020   HDL 35.70 (L) 04/28/2019   Lab Results  Component Value Date   LDLCALC 128 (H) 09/25/2020   LDLCALC 127 (H) 03/21/2020   LDLCALC 121 (H) 04/28/2019   Lab Results  Component Value Date   TRIG 129.0 09/25/2020   TRIG 153.0 (H) 03/21/2020   TRIG 115.0 04/28/2019   Lab Results  Component Value Date  CHOLHDL 5 09/25/2020   CHOLHDL 5 03/21/2020   CHOLHDL 5 04/28/2019   Lab Results  Component Value Date   LDLDIRECT 125.0 12/15/2018   LDLDIRECT 102.0 05/14/2018            Hypertension: Blood pressure is usually normal without medications  Previously on ramipril  BP Readings from Last 3 Encounters:  09/28/20 128/76  05/11/20 118/65  03/26/20 138/88    Most recent eye exam was in 3/19   She  refuses influenza vaccine    LABS:  Lab on 09/25/2020  Component Date Value Ref Range Status   Free T4 09/25/2020 0.44 (A) 0.60 - 1.60 ng/dL Final   Comment: Specimens from patients who are undergoing biotin therapy and /or ingesting biotin supplements may contain high levels of biotin.  The higher biotin concentration in these specimens interferes with this Free T4 assay.  Specimens that contain high levels  of biotin may cause false high results for this Free T4 assay.  Please interpret results in light of the total clinical presentation of the patient.     TSH 09/25/2020 14.67 (A) 0.35 - 5.50 uIU/mL Final   Cholesterol 09/25/2020 189  0 - 200 mg/dL Final   ATP III Classification       Desirable:  < 200 mg/dL               Borderline High:  200 - 239 mg/dL          High:  > = 240 mg/dL   Triglycerides 09/25/2020 129.0  0.0 - 149.0 mg/dL Final   Normal:  <150 mg/dLBorderline High:  150 - 199 mg/dL   HDL 09/25/2020 35.50 (A) >39.00 mg/dL Final   VLDL 09/25/2020 25.8  0.0 - 40.0 mg/dL Final   LDL Cholesterol 09/25/2020 128 (A) 0 - 99 mg/dL Final   Total CHOL/HDL Ratio 09/25/2020 5   Final                  Men          Women1/2 Average Risk     3.4          3.3Average Risk          5.0          4.42X Average Risk          9.6          7.13X Average Risk          15.0          11.0                       NonHDL 09/25/2020 153.74   Final   NOTE:  Non-HDL goal should be 30 mg/dL higher than patient's LDL goal (i.e. LDL goal of < 70 mg/dL, would have non-HDL goal of < 100 mg/dL)   Sodium 09/25/2020 138  135 - 145 mEq/L Final   Potassium 09/25/2020 3.9  3.5 - 5.1 mEq/L Final   Chloride 09/25/2020 104  96 - 112 mEq/L Final   CO2 09/25/2020 26  19 - 32 mEq/L Final   Glucose, Bld 09/25/2020 110 (A) 70 - 99 mg/dL Final   BUN 09/25/2020 11  6 - 23 mg/dL Final   Creatinine, Ser 09/25/2020 1.09  0.40 - 1.20 mg/dL Final   Total Bilirubin 09/25/2020 0.4  0.2 - 1.2 mg/dL Final   Alkaline Phosphatase  09/25/2020 52  39 - 117 U/L Final   AST 09/25/2020  15  0 - 37 U/L Final   ALT 09/25/2020 14  0 - 35 U/L Final   Total Protein 09/25/2020 7.9  6.0 - 8.3 g/dL Final   Albumin 09/25/2020 4.2  3.5 - 5.2 g/dL Final   GFR 09/25/2020 58.19 (A) >60.00 mL/min Final   Calculated using the CKD-EPI Creatinine Equation (2021)   Calcium 09/25/2020 9.5  8.4 - 10.5 mg/dL Final   Hgb A1c MFr Bld 09/25/2020 7.6 (A) 4.6 - 6.5 % Final   Glycemic Control Guidelines for People with Diabetes:Non Diabetic:  <6%Goal of Therapy: <7%Additional Action Suggested:  >8%     Physical Examination:  BP 128/76 (BP Location: Left Arm, Patient Position: Sitting, Cuff Size: Large)   Pulse 93   Ht 5\' 8"  (1.727 m)   Wt 273 lb (123.8 kg)   SpO2 98%   BMI 41.51 kg/m      ASSESSMENT:  Diabetes type 2, current BMI 41  See history of present illness for detailed discussion of current diabetes management, blood sugar patterns and problems identified  A1c is 7.7 last and now 7.6  Blood sugars are significantly higher recently in the last 30 days from her home monitoring Also has gained weight despite taking Ozempic 0.5 mg  She also has had difficulty with stress and lack of exercise Blood sugars are averaging about 180 at home as before and higher after meals  Discussed that she likely needs a combination treatment instead of just Ozempic for better control and long-term weight loss  HYPOTHYROIDISM: She has been prescribed 200 mcg of levothyroxine but she has only taken 1/2 tablet of 300 mg from previous prescription She reports nausea with taking the medicine more than 30 minutes before breakfast  Usual symptoms with hypothyroidism was are brain fog TSH consistently high in 2021 and not clear why free T4 is below normal also  LIPIDS: She has had hypercholesterolemia  which is persistent despite prescribing fluvastatin, likely not taking this regularly   PLAN:   She will be prescribed JARDIANCE 10 mg Discussed  action of SGLT 2 drugs on lowering glucose by decreasing kidney absorption of glucose, benefits of weight loss and lower blood pressure, possible side effects including candidiasis and dosage regimen  She will need to increase her fluid intake with this In the meantime she will need to take a higher dose of Ozempic; since she has not taken any injections for the last 2 weeks she will start with half a dose of the 1 mg by dialing 2 turns of the pen instead of 4 and go up to 1 mg after 2 injections She needs to start walking regularly Plan her meals better and cut back on carbohydrates and fat intake  Take fluvastatin regularly Recheck labs in 2 months She will go back to 200 mcg of levothyroxine every day and take this regularly 30-minute before breakfast    Patient Instructions  Take 1/2 dose Ozempic for 2 weeks then 1mg  weekly        Elayne Snare 09/29/2020, 10:30 AM   Note: This office note was prepared with Dragon voice recognition system technology. Any transcriptional errors that result from this process are unintentional.

## 2020-11-29 ENCOUNTER — Other Ambulatory Visit (INDEPENDENT_AMBULATORY_CARE_PROVIDER_SITE_OTHER): Payer: BC Managed Care – PPO

## 2020-11-29 ENCOUNTER — Other Ambulatory Visit: Payer: Self-pay

## 2020-11-29 DIAGNOSIS — E1165 Type 2 diabetes mellitus with hyperglycemia: Secondary | ICD-10-CM

## 2020-11-29 DIAGNOSIS — E039 Hypothyroidism, unspecified: Secondary | ICD-10-CM | POA: Diagnosis not present

## 2020-11-29 DIAGNOSIS — E7800 Pure hypercholesterolemia, unspecified: Secondary | ICD-10-CM

## 2020-11-29 LAB — BASIC METABOLIC PANEL
BUN: 16 mg/dL (ref 6–23)
CO2: 23 mEq/L (ref 19–32)
Calcium: 9.8 mg/dL (ref 8.4–10.5)
Chloride: 105 mEq/L (ref 96–112)
Creatinine, Ser: 1.21 mg/dL — ABNORMAL HIGH (ref 0.40–1.20)
GFR: 51.27 mL/min — ABNORMAL LOW (ref >60.00)
Glucose, Bld: 184 mg/dL — ABNORMAL HIGH (ref 70–99)
Potassium: 4.1 mEq/L (ref 3.5–5.1)
Sodium: 138 mEq/L (ref 135–145)

## 2020-11-29 LAB — T4, FREE: Free T4: 0.32 ng/dL — ABNORMAL LOW (ref 0.60–1.60)

## 2020-11-29 LAB — TSH: TSH: 32.41 u[IU]/mL — ABNORMAL HIGH (ref 0.35–5.50)

## 2020-11-29 LAB — LDL CHOLESTEROL, DIRECT: Direct LDL: 112 mg/dL

## 2020-11-30 LAB — FRUCTOSAMINE: Fructosamine: 285 umol/L (ref 0–285)

## 2020-12-03 ENCOUNTER — Encounter: Payer: Self-pay | Admitting: Endocrinology

## 2020-12-03 ENCOUNTER — Ambulatory Visit (INDEPENDENT_AMBULATORY_CARE_PROVIDER_SITE_OTHER): Payer: BC Managed Care – PPO | Admitting: Endocrinology

## 2020-12-03 ENCOUNTER — Other Ambulatory Visit: Payer: Self-pay

## 2020-12-03 VITALS — BP 138/80 | HR 90 | Ht 68.0 in | Wt 269.0 lb

## 2020-12-03 DIAGNOSIS — E039 Hypothyroidism, unspecified: Secondary | ICD-10-CM | POA: Diagnosis not present

## 2020-12-03 DIAGNOSIS — E1165 Type 2 diabetes mellitus with hyperglycemia: Secondary | ICD-10-CM | POA: Diagnosis not present

## 2020-12-03 DIAGNOSIS — E7800 Pure hypercholesterolemia, unspecified: Secondary | ICD-10-CM

## 2020-12-03 NOTE — Progress Notes (Signed)
Patient ID: Gail Barr, female   DOB: [birth year], 55 y.o.   MRN: [id number]          Reason for Appointment: Endocrinology follow-up  Referring physician: Wilfred Lacy   History of Present Illness:          Date of diagnosis of type 2 diabetes mellitus:  2015    Background history:   She had an upper normal A1c of 6.5 in 2014 and then subsequently A1c was in the diabetic range Appears that her blood sugars have been poorly controlled with progressive rise in A1c Follow-up for diabetes has been infrequent and she has not had a consistent primary care provider also She had been treated with metformin initially Subsequently had been on Farxiga in 2016 and also Invokamet subsequently  In 2/19 she had marked hyperglycemia at the emergency room when seen for headache and blurred vision with glucose of 539.  Recent history:        Non-insulin hypoglycemic drugs: Ozempic 0.5 mg weekly, Jardiance 10 mg daily  A1c  is 7.6 and about the same as before  Current management, blood sugar patterns and problems identified:  She had been started on Jardiance in addition to her Ozempic on her last visit  She is tolerating this well without side effects but not clear if she is benefiting  She was told to take 1 mg Ozempic but she tried this only once; she does injections in her leg and with this injection she had pain in her leg area for 3 to 4 days and did not try the high-dose again  Her blood sugars are mostly high with only occasional good readings before dinnertime  Has significantly high readings fasting and sometimes after dinner  She said that because of stress and taking care of various family issues she has had higher readings Likely not planning meals well Has occasional exercise and recently starting to go to the gym With this her weight is down about 3 or 4 pounds only        Side effects from medications have been: Diarrhea, abdominal pain from metformin   Glucose  monitoring:  As below       Glucometer: One Touch Verio.       Blood Glucose readings from download recently   PRE-MEAL Fasting Lunch Dinner Bedtime Overall  Glucose range:   122  107-  Mean/median: 185   214 172   Previously:  PRE-MEAL Fasting Midday Dinner Bedtime Overall  Glucose range: 153-183   121-320 121-320  Mean/median: 172 156  220 183+/-50    Previous readings:    Self-care: The diet that the patient has been following is: tries to limit high-fat foods.     Typical meal intake: Breakfast is eggs with spinach Lunch usually [place] salad and pita bread.  Dinner is rice or pasta dishes or a small snack               Dietician visit, most recent: 1/20  Weight history:  Wt Readings from Last 3 Encounters:  12/03/20 269 lb (122 kg)  09/28/20 273 lb (123.8 kg)  05/11/20 260 lb (117.9 kg)   Glycemic control:   Lab Results  Component Value Date   HGBA1C 7.6 (H) 09/25/2020   HGBA1C 7.7 (H) 03/21/2020   HGBA1C 7.0 (H) 08/09/2019   Lab Results  Component Value Date   MICROALBUR 18.1 (H) 03/21/2020   LDLCALC 128 (H) 09/25/2020   CREATININE 1.21 (H) 11/29/2020   Lab Results  Component Value Date   MICRALBCREAT 18.7 03/21/2020    Lab Results  Component Value Date   FRUCTOSAMINE 285 11/29/2020   FRUCTOSAMINE 258 05/14/2018   FRUCTOSAMINE 277 07/09/2017    Problem 2:  HYPOTHYROIDISM  She was diagnosed to have hypothyroidism soon after her pregnancy around the year 1995 At that time she had a swelling in her thyroid and no other significant symptoms She was started on thyroid supplements and has been on these and variable doses over the last several years More recently she has been taking mostly 200 mcg She thinks that when her thyroid levels are lower when she is not taking her levothyroxine regularly she only has some difficulty with remembering things and some thinning of her hair but usually no cold intolerance or unusual fatigue She also tends to have  more constipation  Although she was initially started on 200 mcg on 04/24/2017 because of her TSH being higher 2 weeks later another 50 mcg was added  TSH was still high when she was taking separate prescriptions for 200 and 50 mcg tablets  She continues to be persistently hypothyroid  Although she has gone up to the 200 mcg of levothyroxine dosage since her last visit in July her TSH is higher than before  Usually she does not feel any different with or without the medication and no recent fatigue She has not missed any doses of levothyroxine  GOITER: She has had a goiter for several years and her ultrasound shows only heterogenous tissue and no nodules    Lab Results  Component Value Date   TSH 32.41 (H) 11/29/2020   TSH 14.67 (H) 09/25/2020   TSH 11.83 (H) 03/21/2020   FREET4 0.32 (L) 11/29/2020   FREET4 0.44 (L) 09/25/2020   FREET4 0.49 (L) 03/21/2020       Allergies as of 12/03/2020       Reactions   Betadine [povidone Iodine]    Sob, swelling   Ibuprofen Shortness Of Breath, Swelling, Anaphylaxis   And chest pain.  Tolerates naproxen without problems   Iodinated Diagnostic Agents Anaphylaxis   Iodine Anaphylaxis   Iohexol     Desc: Pt states [place] 5 [place] and experienced severe sob and swelling. We did her scan w/o iv contrast today.        Thanks., Onset Date: [id number]        Medication List        Accurate as of December 03, 2020 11:59 PM. If you have any questions, ask your nurse or doctor.          empagliflozin 10 MG Tabs tablet Commonly known as: Jardiance Take 1 tablet (10 mg total) by mouth daily with breakfast.   fluvastatin XL 80 MG 24 hr tablet Commonly known as: LESCOL XL TAKE 1 TABLET BY MOUTH EVERY DAY   Lancet Device Misc Check blood glucose 4 time daily (before meals and at bed time). E11.29. One touch Veiro meter. What changed: additional instructions   levothyroxine 200 MCG tablet Commonly known as:  SYNTHROID TAKE 1 TABLET BY MOUTH EVERY DAY BEFORE BREAKFAST   MULTIVITAL PO Take by mouth as needed.   OneTouch Delica Lancets 99991111 Misc Use to check blood sugar once a day   OneTouch Verio test strip Generic drug: glucose blood 1 each by Other route as needed for other. Use as instructed to check blood sugar once a day   Ozempic (1 MG/DOSE) 2 MG/1.5ML Sopn  Generic drug: Semaglutide (1 MG/DOSE) Inject 1 mg into the skin once a week.   Vitamin D (Ergocalciferol) 1.25 MG (50000 UNIT) Caps capsule Commonly known as: DRISDOL Take 1 capsule (50,000 Units total) by mouth every 7 (seven) days.        Allergies:  Allergies  Allergen Reactions   Betadine [Povidone Iodine]     Sob, swelling   Ibuprofen Shortness Of Breath, Swelling and Anaphylaxis    And chest pain.  Tolerates naproxen without problems   Iodinated Diagnostic Agents Anaphylaxis   Iodine Anaphylaxis   Iohexol      Desc: Pt states she had a CT [place] approx 5 yrs ago and experienced severe sob and swelling. We did her scan w/o iv contrast today.        Thanks., Onset Date: [id number]     Past Medical History:  Diagnosis Date   Abnormal Pap smear early 2000's   had colposcopy, no additional treatment. normal paps since   Chronic kidney disease 2018   one kidney   Diabetes mellitus without complication (HCC)    Glaucoma    no eye drops   History of chickenpox    Hypothyroidism 1993   Multiple thyroid nodules    Pyelonephritis 1993   when pregnant; subsequently found to have shrunken, nonfunctioning kidney on one side   Unilateral renal atrophy     Past Surgical History:  Procedure Laterality Date   DILITATION & CURRETTAGE/HYSTROSCOPY WITH NOVASURE ABLATION N/A 03/02/2013   Procedure: DILATATION & CURETTAGE/HYSTEROSCOPY WITH NOVASURE ABLATION;  Surgeon: Olga Millers, MD;  Location: [facility];  Service: Gynecology;  Laterality: N/A;    Family History  Problem Relation Age of Onset   Asthma Brother     Diabetes Brother    Diabetes Maternal Aunt    Diabetes Maternal Uncle    Diabetes Maternal Grandmother    Cancer Maternal Grandfather        prostate cancer   Colon cancer Neg Hx    Rectal cancer Neg Hx    Stomach cancer Neg Hx     Social History:  reports that she has never smoked. She has never used smokeless tobacco. She reports current alcohol use. She reports that she does not use drugs.   Review of Systems   Lipid history: Her baseline LDL has been high, last 125 when she was told to take pravastatin However she she was having nausea and stopped it She apparently also had nausea with Crestor which was tried in 01/2019  if she is taking her fluvastatin regularly  Her lipids are variable as follows and LDL appears to be consistently high    Lab Results  Component Value Date   CHOL 189 09/25/2020   CHOL 196 03/21/2020   CHOL 180 04/28/2019   Lab Results  Component Value Date   HDL 35.50 (L) 09/25/2020   HDL 38.80 (L) 03/21/2020   HDL 35.70 (L) 04/28/2019   Lab Results  Component Value Date   LDLCALC 128 (H) 09/25/2020   LDLCALC 127 (H) 03/21/2020   LDLCALC 121 (H) 04/28/2019   Lab Results  Component Value Date   TRIG 129.0 09/25/2020   TRIG 153.0 (H) 03/21/2020   TRIG 115.0 04/28/2019   Lab Results  Component Value Date   CHOLHDL 5 09/25/2020   CHOLHDL 5 03/21/2020   CHOLHDL 5 04/28/2019   Lab Results  Component Value Date   LDLDIRECT 112.0 11/29/2020   LDLDIRECT 125.0 12/15/2018   LDLDIRECT 102.0 05/14/2018  Hypertension: Blood pressure is usually normal without medications  Previously on ramipril  BP Readings from Last 3 Encounters:  12/03/20 138/80  09/28/20 128/76  05/11/20 118/65    Most recent eye exam was in 3/19   She refuses influenza vaccine    LABS:  Lab on 11/29/2020  Component Date Value Ref Range Status   Free T4 11/29/2020 0.32 (A) 0.60 - 1.60 ng/dL Final   Comment: Specimens from patients who are  undergoing biotin therapy and /or ingesting biotin supplements may contain high levels of biotin.  The higher biotin concentration in these specimens interferes with this Free T4 assay.  Specimens that contain high levels  of biotin may cause false high results for this Free T4 assay.  Please interpret results in light of the total clinical presentation of the patient.     TSH 11/29/2020 32.41 (A) 0.35 - 5.50 uIU/mL Final   Direct LDL 11/29/2020 112.0  mg/dL Final   Optimal:  <100 mg/dLNear or Above Optimal:  100-129 mg/dLBorderline High:  130-159 mg/dLHigh:  160-189 mg/dLVery High:  >190 mg/dL   Fructosamine 11/29/2020 285  0 - 285 umol/L Final   Comment: Published reference interval for apparently healthy subjects between age 10 and 72 is 33 - 285 umol/L and in a poorly controlled diabetic population is 228 - 563 umol/L with a mean of 396 umol/L.    Sodium 11/29/2020 138  135 - 145 mEq/L Final   Potassium 11/29/2020 4.1  3.5 - 5.1 mEq/L Final   Chloride 11/29/2020 105  96 - 112 mEq/L Final   CO2 11/29/2020 23  19 - 32 mEq/L Final   Glucose, Bld 11/29/2020 184 (A) 70 - 99 mg/dL Final   BUN 11/29/2020 16  6 - 23 mg/dL Final   Creatinine, Ser 11/29/2020 1.21 (A) 0.40 - 1.20 mg/dL Final   GFR 11/29/2020 51.27 (A) >60.00 mL/min Final   Calculated using the CKD-EPI Creatinine Equation (2021)   Calcium 11/29/2020 9.8  8.4 - 10.5 mg/dL Final    Physical Examination:  BP 138/80   Pulse 90   Ht '5\' 8"'$  (1.727 m)   Wt 269 lb (122 kg)   SpO2 97%   BMI 40.90 kg/m      ASSESSMENT:  Diabetes type 2, current BMI 41  See history of present illness for detailed discussion of current diabetes management, blood sugar patterns and problems identified  A1c is 7.7 last and now 7.6  Blood sugars are mostly high especially fasting  Although her weight has finally started to come down currently with adding 10 mg Jardiance is only minimal and she still has a BMI over 40  She likely can tolerate  1 mg or higher doses of Ozempic, reassured her that if she had a painful injection with the 1 mg dosage was slightly to the location of the injection No side effects from Jardiance so far  She again has had difficulty with stress causing high sugars and possibly difficulty with complying with diet Exercise has been inadequate Overall blood sugars are more difficult to control  HYPOTHYROIDISM: She has been taking 200 mcg of levothyroxine but she has a higher TSH compared to when she was taking 150 mg She has taken this regularly before eating in the morning  Only symptoms with hypothyroidism are brain fog   LIPIDS: She has had hypercholesterolemia  which is persistent and she thinks she has nausea with all statin drugs   PLAN:   She will try the 1 mg  Ozempic again  Discussed that she can reduce the subcutaneous tissue in her abdomen to inject this instead of her leg and this will be easy with pinching of the skin  May also consider 2 mg on the next visit  Increase Jardiance to 25 mg  Start walking and exercising at least 4 days a week  She will try to plan her meals well and consider consultation with dietitian   For her hypothyroidism she will go up to 300 mg of levothyroxine Trial of ATORVASTATIN 10 mg daily to take with her lunch meal, discussed need for cardiovascular risk reduction and reducing LDL by up to 50%    Patient Instructions  Take 1 1/2 of 200ug thyroid  Take 2 Jardiance  '1mg'$  Ozempic weekly in abdomen  Take Lipitor every 2 daily with lunch        Elayne Snare 12/04/2020, 8:49 AM   Note: This office note was prepared with Dragon voice recognition system technology. Any transcriptional errors that result from this process are unintentional.

## 2020-12-03 NOTE — Patient Instructions (Addendum)
Take 1 1/2 of 200ug thyroid  Take 2 Jardiance  '1mg'$  Ozempic weekly in abdomen  Take Lipitor every 2 daily with lunch

## 2020-12-04 MED ORDER — LEVOTHYROXINE SODIUM 300 MCG PO TABS: 300.0000 ug | ORAL_TABLET | Freq: Every day | ORAL | 3 refills | Status: DC

## 2020-12-12 ENCOUNTER — Other Ambulatory Visit: Payer: Self-pay | Admitting: Endocrinology

## 2021-01-01 ENCOUNTER — Other Ambulatory Visit: Payer: Self-pay | Admitting: Endocrinology

## 2021-02-04 ENCOUNTER — Other Ambulatory Visit: Payer: BC Managed Care – PPO

## 2021-02-07 ENCOUNTER — Ambulatory Visit: Payer: BC Managed Care – PPO | Admitting: Endocrinology

## 2021-02-28 ENCOUNTER — Other Ambulatory Visit: Payer: Self-pay | Admitting: Endocrinology

## 2021-04-25 ENCOUNTER — Other Ambulatory Visit: Payer: Self-pay | Admitting: Endocrinology

## 2021-05-13 ENCOUNTER — Other Ambulatory Visit: Payer: Self-pay | Admitting: Endocrinology

## 2021-05-15 ENCOUNTER — Telehealth: Payer: Self-pay

## 2021-05-15 NOTE — Telephone Encounter (Signed)
Patient left vm. Not able to understand what patient needed. Tried calling back no answer. Left vm to call office back

## 2021-05-24 ENCOUNTER — Other Ambulatory Visit: Payer: Self-pay

## 2021-05-24 ENCOUNTER — Other Ambulatory Visit (INDEPENDENT_AMBULATORY_CARE_PROVIDER_SITE_OTHER): Payer: BC Managed Care – PPO

## 2021-05-24 DIAGNOSIS — E039 Hypothyroidism, unspecified: Secondary | ICD-10-CM

## 2021-05-24 DIAGNOSIS — E7800 Pure hypercholesterolemia, unspecified: Secondary | ICD-10-CM

## 2021-05-24 DIAGNOSIS — E1165 Type 2 diabetes mellitus with hyperglycemia: Secondary | ICD-10-CM | POA: Diagnosis not present

## 2021-05-24 LAB — BASIC METABOLIC PANEL
BUN: 14 mg/dL (ref 6–23)
CO2: 27 mEq/L (ref 19–32)
Calcium: 9.8 mg/dL (ref 8.4–10.5)
Chloride: 104 mEq/L (ref 96–112)
Creatinine, Ser: 1.09 mg/dL (ref 0.40–1.20)
GFR: 57.92 mL/min — ABNORMAL LOW (ref >60.00)
Glucose, Bld: 131 mg/dL — ABNORMAL HIGH (ref 70–99)
Potassium: 4.2 mEq/L (ref 3.5–5.1)
Sodium: 138 mEq/L (ref 135–145)

## 2021-05-24 LAB — LIPID PANEL
Cholesterol: 177 mg/dL (ref 0–200)
HDL: 35.2 mg/dL — ABNORMAL LOW (ref >39.00)
LDL Cholesterol: 117 mg/dL — ABNORMAL HIGH (ref 0–99)
NonHDL: 141.62
Total CHOL/HDL Ratio: 5
Triglycerides: 125 mg/dL (ref 0.0–149.0)
VLDL: 25 mg/dL (ref 0.0–40.0)

## 2021-05-24 LAB — T4, FREE: Free T4: 0.56 ng/dL — ABNORMAL LOW (ref 0.60–1.60)

## 2021-05-24 LAB — TSH: TSH: 9.15 u[IU]/mL — ABNORMAL HIGH (ref 0.35–5.50)

## 2021-05-25 LAB — FRUCTOSAMINE: Fructosamine: 237 umol/L (ref 0–285)

## 2021-05-29 ENCOUNTER — Encounter: Payer: Self-pay | Admitting: Endocrinology

## 2021-05-29 ENCOUNTER — Other Ambulatory Visit: Payer: Self-pay

## 2021-05-29 ENCOUNTER — Ambulatory Visit (INDEPENDENT_AMBULATORY_CARE_PROVIDER_SITE_OTHER): Payer: 59 | Admitting: Endocrinology

## 2021-05-29 VITALS — BP 122/78 | HR 82 | Ht 68.0 in | Wt 255.8 lb

## 2021-05-29 DIAGNOSIS — E039 Hypothyroidism, unspecified: Secondary | ICD-10-CM

## 2021-05-29 DIAGNOSIS — E1165 Type 2 diabetes mellitus with hyperglycemia: Secondary | ICD-10-CM

## 2021-05-29 DIAGNOSIS — E7800 Pure hypercholesterolemia, unspecified: Secondary | ICD-10-CM

## 2021-05-29 LAB — URINALYSIS, ROUTINE W REFLEX MICROSCOPIC
Bilirubin Urine: NEGATIVE
Hgb urine dipstick: NEGATIVE
Ketones, ur: NEGATIVE
Nitrite: POSITIVE — AB
RBC / HPF: NONE SEEN (ref 0–?)
Specific Gravity, Urine: 1.015 (ref 1.000–1.030)
Total Protein, Urine: NEGATIVE
Urine Glucose: NEGATIVE
Urobilinogen, UA: 0.2 (ref 0.0–1.0)
pH: 6 (ref 5.0–8.0)

## 2021-05-29 LAB — T4, FREE: Free T4: 0.47 ng/dL — ABNORMAL LOW (ref 0.60–1.60)

## 2021-05-29 LAB — POCT GLYCOSYLATED HEMOGLOBIN (HGB A1C): Hemoglobin A1C: 6.2 % — AB (ref 4.0–5.6)

## 2021-05-29 LAB — TSH: TSH: 6.99 u[IU]/mL — ABNORMAL HIGH (ref 0.35–5.50)

## 2021-05-29 MED ORDER — SIMVASTATIN 10 MG PO TABS: 10.0000 mg | ORAL_TABLET | Freq: Every day | ORAL | 3 refills | Status: DC

## 2021-05-29 MED ORDER — EMPAGLIFLOZIN 10 MG PO TABS: ORAL_TABLET | ORAL | 2 refills | Status: DC

## 2021-05-29 MED ORDER — LEVOTHYROXINE SODIUM 200 MCG PO TABS: 200.0000 ug | ORAL_TABLET | Freq: Every day | ORAL | 3 refills | Status: DC

## 2021-05-29 NOTE — Progress Notes (Signed)
Patient ID: Gail Barr, female   DOB: [birth year], 56 y.o.   MRN: [id number]          Reason for Appointment: Endocrinology follow-up  Referring physician: Wilfred Lacy   History of Present Illness:          Date of diagnosis of type 2 diabetes mellitus:  2015    Background history:   She had an upper normal A1c of 6.5 in 2014 and then subsequently A1c was in the diabetic range Appears that her blood sugars have been poorly controlled with progressive rise in A1c Follow-up for diabetes has been infrequent and she has not had a consistent primary care provider also She had been treated with metformin initially Subsequently had been on Farxiga in 2016 and also Invokamet subsequently  In 2/19 she had marked hyperglycemia at the emergency room when seen for headache and blurred vision with glucose of 539.  Recent history:        Non-insulin hypoglycemic drugs: Ozempic 1 mg weekly, Jardiance 10 mg  A1c  is much better at 6.2 compared to 7.6  Current management, blood sugar patterns and problems identified:  She had been started on 1 mg Ozempic on her last visit in 9/22  Initially she had a lot of nausea with this but subsequently she was able to tolerate this Her nausea is only intermittent and rare vomiting, she thinks this is tolerable  She is able to control her portions and down 14 pounds in weight now  However she has not done much formal exercise lately  Since her appetite was decreased she has also started watching her diet with cutting back on a lot of carbohydrates, rice and high fat foods, getting more salads and baked foods  She did not bring her meter and not clear how often she is monitoring However only rarely she will have a high reading after dinner overall If she has had an extra snack or food late at night her sugars will be higher in the morning However she says that she does not take Jardiance and only takes it when her blood sugar is high although she has  not had any side effects with this        Side effects from medications have been: Diarrhea, abdominal pain from metformin   Glucose monitoring:  As below       Glucometer: One Touch Verio.       Blood Glucose readings by recall:   PRE-MEAL Fasting Lunch Dinner Bedtime Overall  Glucose range:       Mean/median: 80-140    ?   POST-MEAL PC Breakfast PC Lunch PC Dinner  Glucose range:     Mean/median:  126? Up to 220    Prior  PRE-MEAL Fasting Lunch Dinner Bedtime Overall  Glucose range:   122  107-  Mean/median: 185   214 172    Self-care: The diet that the patient has been following is: tries to limit high-fat foods.     Typical meal intake: Breakfast is eggs with spinach Lunch usually [place] salad and pita bread.  Dinner is rice or pasta dishes or a small snack               Dietician visit, most recent: 1/20  Weight history:  Wt Readings from Last 3 Encounters:  05/29/21 255 lb 12.8 oz (116 kg)  12/03/20 269 lb (122 kg)  09/28/20 273 lb (123.8 kg)   Glycemic control:   Lab Results  Component  Value Date   HGBA1C 6.2 (A) 05/29/2021   HGBA1C 7.6 (H) 09/25/2020   HGBA1C 7.7 (H) 03/21/2020   Lab Results  Component Value Date   MICROALBUR 18.1 (H) 03/21/2020   LDLCALC 117 (H) 05/24/2021   CREATININE 1.09 05/24/2021   Lab Results  Component Value Date   MICRALBCREAT 18.7 03/21/2020    Lab Results  Component Value Date   FRUCTOSAMINE 237 05/24/2021   FRUCTOSAMINE 285 11/29/2020   FRUCTOSAMINE 258 05/14/2018    Problem 2:  HYPOTHYROIDISM  She was diagnosed to have hypothyroidism soon after her pregnancy around the year 1995 At that time she had a swelling in her thyroid and no other significant symptoms She was started on thyroid supplements and has been on these and variable doses over the last several years More recently she has been taking mostly 200 mcg She thinks that when her thyroid levels are lower when she is not taking her levothyroxine  regularly she only has some difficulty with remembering things and some thinning of her hair but usually no cold intolerance or unusual fatigue She also tends to have more constipation  Although she was initially started on 200 mcg on 04/24/2017 because of her TSH being higher 2 weeks later another 50 mcg was added  TSH was still high when she was taking separate prescriptions for 200 and 50 mcg tablets  She continues to be persistently hypothyroid Even with persistently high TSH level she has not complained of unusual fatigue recently  She was told to go back to 300 mcg in 9/22 when her TSH was 32 but her PCP has reduced her down to 175 mcg since November, TSH was again high in December 2022  She has not missed any doses of levothyroxine before her labs but has not taken any for 3 days  GOITER: She has had a goiter for several years and her ultrasound shows only heterogenous tissue and no nodules    Lab Results  Component Value Date   TSH 9.15 (H) 05/24/2021   TSH 32.41 (H) 11/29/2020   TSH 14.67 (H) 09/25/2020   FREET4 0.56 (L) 05/24/2021   FREET4 0.32 (L) 11/29/2020   FREET4 0.44 (L) 09/25/2020       Allergies as of 05/29/2021       Reactions   Betadine [povidone Iodine]    Sob, swelling   Ibuprofen Shortness Of Breath, Swelling, Anaphylaxis   And chest pain.  Tolerates naproxen without problems   Iodinated Contrast Media Anaphylaxis   Iodine Anaphylaxis   Iohexol     Desc: Pt states [place] 5 [place] and experienced severe sob and swelling. We did her scan w/o iv contrast today.        Thanks., Onset Date: [id number]        Medication List        Accurate as of May 29, 2021  1:00 PM. If you have any questions, ask your nurse or doctor.          STOP taking these medications    fluvastatin XL 80 MG 24 hr tablet Commonly known as: LESCOL XL Stopped by: Elayne Snare, MD   Vitamin D (Ergocalciferol) 1.25 MG (50000 UNIT) Caps  capsule Commonly known as: DRISDOL Stopped by: Elayne Snare, MD       TAKE these medications    empagliflozin 10 MG Tabs tablet Commonly known as: Jardiance TAKE 1 TABLET DAILY. PLEASE SCHEDULE FOLLOW-UP   Lancet Device  Misc Check blood glucose 4 time daily (before meals and at bed time). E11.29. One touch Veiro meter. What changed: additional instructions   levothyroxine 200 MCG tablet Commonly known as: SYNTHROID Take 1 tablet (200 mcg total) by mouth daily. What changed:  medication strength how much to take Changed by: Elayne Snare, MD   MULTIVITAL PO Take by mouth as needed.   OneTouch Delica Lancets 08M Misc Use to check blood sugar once a day   OneTouch Verio test strip Generic drug: glucose blood 1 each by Other route as needed for other. Use as instructed to check blood sugar once a day   Ozempic (1 MG/DOSE) 4 MG/3ML Sopn Generic drug: Semaglutide (1 MG/DOSE) INJECT '1MG'$  INTO THE SKIN ONCE A WEEK   simvastatin 10 MG tablet Commonly known as: ZOCOR Take 1 tablet (10 mg total) by mouth at bedtime. Started by: Elayne Snare, MD        Allergies:  Allergies  Allergen Reactions   Betadine [Povidone Iodine]     Sob, swelling   Ibuprofen Shortness Of Breath, Swelling and Anaphylaxis    And chest pain.  Tolerates naproxen without problems   Iodinated Contrast Media Anaphylaxis   Iodine Anaphylaxis   Iohexol      Desc: Pt states she had a CT [place] approx 5 yrs ago and experienced severe sob and swelling. We did her scan w/o iv contrast today.        Thanks., Onset Date: [id number]     Past Medical History:  Diagnosis Date   Abnormal Pap smear early 2000's   had colposcopy, no additional treatment. normal paps since   Chronic kidney disease 2018   one kidney   Diabetes mellitus without complication (HCC)    Glaucoma    no eye drops   History of chickenpox    Hypothyroidism 1993   Multiple thyroid nodules    Pyelonephritis 1993   when pregnant;  subsequently found to have shrunken, nonfunctioning kidney on one side   Unilateral renal atrophy     Past Surgical History:  Procedure Laterality Date   DILITATION & CURRETTAGE/HYSTROSCOPY WITH NOVASURE ABLATION N/A 03/02/2013   Procedure: DILATATION & CURETTAGE/HYSTEROSCOPY WITH NOVASURE ABLATION;  Surgeon: Olga Millers, MD;  Location: [facility];  Service: Gynecology;  Laterality: N/A;    Family History  Problem Relation Age of Onset   Asthma Brother    Diabetes Brother    Diabetes Maternal Aunt    Diabetes Maternal Uncle    Diabetes Maternal Grandmother    Cancer Maternal Grandfather        prostate cancer   Colon cancer Neg Hx    Rectal cancer Neg Hx    Stomach cancer Neg Hx     Social History:  reports that she has never smoked. She has never used smokeless tobacco. She reports current alcohol use. She reports that she does not use drugs.   Review of Systems   Lipid history: Her baseline LDL has been high, last 125 when she was told to take pravastatin However she she was having nausea and stopped it She apparently also had nausea with Crestor which was tried in 01/2019 Again she also has nausea with LESCOL XL and has not taken any  Her lipids are variable as follows and LDL appears to be again high    Lab Results  Component Value Date   CHOL 177 05/24/2021   CHOL 189 09/25/2020   CHOL 196 03/21/2020   Lab Results  Component Value Date   HDL 35.20 (L) 05/24/2021   HDL 35.50 (L) 09/25/2020   HDL 38.80 (L) 03/21/2020   Lab Results  Component Value Date   LDLCALC 117 (H) 05/24/2021   LDLCALC 128 (H) 09/25/2020   LDLCALC 127 (H) 03/21/2020   Lab Results  Component Value Date   TRIG 125.0 05/24/2021   TRIG 129.0 09/25/2020   TRIG 153.0 (H) 03/21/2020   Lab Results  Component Value Date   CHOLHDL 5 05/24/2021   CHOLHDL 5 09/25/2020   CHOLHDL 5 03/21/2020   Lab Results  Component Value Date   LDLDIRECT 112.0 11/29/2020   LDLDIRECT 125.0 12/15/2018    LDLDIRECT 102.0 05/14/2018            Hypertension: Blood pressure is usually normal without medications  Previously on ramipril  BP Readings from Last 3 Encounters:  05/29/21 122/78  12/03/20 138/80  09/28/20 128/76    Most recent eye exam was in 20[address]  She refuses influenza vaccine    LABS:  Office Visit on 05/29/2021  Component Date Value Ref Range Status   Hemoglobin A1C 05/29/2021 6.2 (A)  4.0 - 5.6 % Final  Lab on 05/24/2021  Component Date Value Ref Range Status   Free T4 05/24/2021 0.56 (L)  0.60 - 1.60 ng/dL Final   Comment: Specimens from patients who are undergoing biotin therapy and /or ingesting biotin supplements may contain high levels of biotin.  The higher biotin concentration in these specimens interferes with this Free T4 assay.  Specimens that contain high levels  of biotin may cause false high results for this Free T4 assay.  Please interpret results in light of the total clinical presentation of the patient.     TSH 05/24/2021 9.15 (H)  0.35 - 5.50 uIU/mL Final   Cholesterol 05/24/2021 177  0 - 200 mg/dL Final   ATP III Classification       Desirable:  < 200 mg/dL               Borderline High:  200 - 239 mg/dL          High:  > = 240 mg/dL   Triglycerides 05/24/2021 125.0  0.0 - 149.0 mg/dL Final   Normal:  <150 mg/dLBorderline High:  150 - 199 mg/dL   HDL 05/24/2021 35.20 (L)  >39.00 mg/dL Final   VLDL 05/24/2021 25.0  0.0 - 40.0 mg/dL Final   LDL Cholesterol 05/24/2021 117 (H)  0 - 99 mg/dL Final   Total CHOL/HDL Ratio 05/24/2021 5   Final                  Men          Women1/2 Average Risk     3.4          3.3Average Risk          5.0          4.42X Average Risk          9.6          7.13X Average Risk          15.0          11.0                       NonHDL 05/24/2021 141.62   Final   NOTE:  Non-HDL goal should be 30 mg/dL higher than patient's LDL goal (i.e. LDL goal of < 70 mg/dL, would have  non-HDL goal of < 100 mg/dL)   Fructosamine  05/24/2021 237  0 - 285 umol/L Final   Comment: Published reference interval for apparently healthy subjects between age 4 and 71 is 73 - 285 umol/L and in a poorly controlled diabetic population is 228 - 563 umol/L with a mean of 396 umol/L.    Sodium 05/24/2021 138  135 - 145 mEq/L Final   Potassium 05/24/2021 4.2  3.5 - 5.1 mEq/L Final   Chloride 05/24/2021 104  96 - 112 mEq/L Final   CO2 05/24/2021 27  19 - 32 mEq/L Final   Glucose, Bld 05/24/2021 131 (H)  70 - 99 mg/dL Final   BUN 05/24/2021 14  6 - 23 mg/dL Final   Creatinine, Ser 05/24/2021 1.09  0.40 - 1.20 mg/dL Final   GFR 05/24/2021 57.92 (L)  >60.00 mL/min Final   Calculated using the CKD-EPI Creatinine Equation (2021)   Calcium 05/24/2021 9.8  8.4 - 10.5 mg/dL Final    Physical Examination:  BP 122/78    Pulse 82    Ht '5\' 8"'$  (1.727 m)    Wt 255 lb 12.8 oz (116 kg)    SpO2 98%    BMI 38.89 kg/m      ASSESSMENT:  Diabetes type 2, current BMI 39  See history of present illness for detailed discussion of current diabetes management, blood sugar patterns and problems identified  A1c is much better at 6.2  Blood sugars are significantly better controlled with both improving her diet, going up to Ozempic 1 mg and some weight loss Not clear what her blood sugar patterns are as she did not bring her monitor She does need to exercise and goal to bring her BMI at least under 35  HYPOTHYROIDISM: She has been taking 175 mcg of levothyroxine but she has a higher TSH consistently now Not clear what dose she was taking in 11/22 when her dose was reduced Currently not very symptomatic   LIPIDS: She has had hypercholesterolemia  which is persistent and she thinks she has nausea with all statin drugs, not clear if she tried atorvastatin that was prescribed   PLAN:   She will continue the 1 mg Ozempic However if she continues to have excessive nausea she can go back to 0.5 mg Discussed benefits of Jardiance and  complementary mechanism of action with Ozempic as well as long-term benefits of weight loss, renal and cardiovascular benefits and she will start with 10 mg again   Start walking or general exercising at least 4 days a week  She will try to plan her meals well and consider consultation with dietitian   For her hypothyroidism she will go up to 300 mg of levothyroxine Trial of SIMVASTATIN 10 mg daily to take with her lunch meal, discussed need for cardiovascular risk reduction and reducing LDL to at least below 100  For her thyroid she will go back to the 200 mcg levothyroxine instead of 175  Left lower quadrant discomfort and some dysuria: We will check her urinalysis today   There are no Patient Instructions on file for this visit.        Elayne Snare 05/29/2021, 1:00 PM   Note: This office note was prepared with Dragon voice recognition system technology. Any transcriptional errors that result from this process are unintentional.

## 2021-05-30 NOTE — Progress Notes (Signed)
May have mild UTI, results forwarded to PCP and she needs to contact him

## 2021-06-10 ENCOUNTER — Telehealth: Payer: Self-pay

## 2021-06-10 ENCOUNTER — Other Ambulatory Visit (HOSPITAL_COMMUNITY): Payer: Self-pay

## 2021-06-10 ENCOUNTER — Telehealth: Payer: Self-pay | Admitting: Pharmacy Technician

## 2021-06-10 NOTE — Telephone Encounter (Signed)
PA has been submitted.

## 2021-06-10 NOTE — Telephone Encounter (Signed)
Patient left vm that she was having a problem getting her jardiance filled at pharmacy. Called pharmacy and PA is needed. Please start PA for patient thank you. ?

## 2021-06-10 NOTE — Telephone Encounter (Addendum)
Patient Advocate Encounter ? ?Received notification from [facility] regarding a prior authorization for JARDIANCE '10MG'$ . Authorization has been APPROVED from 03.20.23 to 3.20.24.  ? ?Per test claim, copay for 30 days supply is $0 ? ?Authorization # [id number] ? ? ? ?Received notification from [place] that prior authorization for JARDIANCE '10MG'$  is required. ?  ?PA submitted on 3.20.23 ?Key [id number] ?Status is pending ?  ?[facility] will continue to follow ? ?Gail Barr, CPhT ?Patient Advocate ?[facility] ?Phone: [phone] ?Fax:  [phone] ? ?

## 2021-07-01 ENCOUNTER — Other Ambulatory Visit: Payer: Self-pay | Admitting: Endocrinology

## 2021-07-02 ENCOUNTER — Other Ambulatory Visit (HOSPITAL_COMMUNITY): Payer: Self-pay

## 2021-07-02 ENCOUNTER — Telehealth: Payer: Self-pay

## 2021-07-02 NOTE — Telephone Encounter (Signed)
Patient Advocate Encounter ?  ?Received notification from [facility] that prior authorization for Ozempic '4mg'$ /39m pen injectors is required by his/her insurance [employer]. ?  ?PA submitted on 07/02/21 ? ?Key#: [id number]? ?Status is pending ?   ?[facility] will continue to follow: ? ?Patient Advocate ?Fax: [phone] ?

## 2021-07-03 ENCOUNTER — Other Ambulatory Visit (HOSPITAL_COMMUNITY): Payer: Self-pay

## 2021-07-03 NOTE — Telephone Encounter (Signed)
Patient Advocate Encounter ? ?Prior Authorization for Ozempic '4mg'$ /34m pen injectors has been approved.   ? ?PA# [id number]? ?Effective dates: 07/02/21 through 07/03/22 ? ?Refill too soon. ? ? ?Patient Advocate ?Fax: [phone] ?

## 2021-08-09 ENCOUNTER — Other Ambulatory Visit: Payer: Self-pay | Admitting: Endocrinology

## 2021-08-26 ENCOUNTER — Other Ambulatory Visit: Payer: 59

## 2021-08-29 ENCOUNTER — Ambulatory Visit: Payer: 59 | Admitting: Endocrinology

## 2021-09-04 ENCOUNTER — Other Ambulatory Visit: Payer: Self-pay | Admitting: Endocrinology

## 2022-03-26 ENCOUNTER — Other Ambulatory Visit: Payer: Self-pay | Admitting: Endocrinology

## 2022-03-27 ENCOUNTER — Other Ambulatory Visit: Payer: Self-pay | Admitting: Endocrinology

## 2022-03-28 ENCOUNTER — Other Ambulatory Visit (HOSPITAL_COMMUNITY): Payer: Self-pay

## 2022-03-28 NOTE — Telephone Encounter (Signed)
Per test claim, PA not needed for Ozempic

## 2022-04-03 ENCOUNTER — Other Ambulatory Visit: Payer: Self-pay

## 2022-04-03 DIAGNOSIS — E1165 Type 2 diabetes mellitus with hyperglycemia: Secondary | ICD-10-CM

## 2022-04-03 MED ORDER — OZEMPIC (1 MG/DOSE) 4 MG/3ML ~~LOC~~ SOPN: PEN_INJECTOR | SUBCUTANEOUS | 3 refills | Status: DC

## 2022-04-14 ENCOUNTER — Other Ambulatory Visit (INDEPENDENT_AMBULATORY_CARE_PROVIDER_SITE_OTHER): Payer: 59

## 2022-04-14 DIAGNOSIS — E1165 Type 2 diabetes mellitus with hyperglycemia: Secondary | ICD-10-CM

## 2022-04-14 LAB — BASIC METABOLIC PANEL
BUN: 17 mg/dL (ref 6–23)
CO2: 23 mEq/L (ref 19–32)
Calcium: 9 mg/dL (ref 8.4–10.5)
Chloride: 106 mEq/L (ref 96–112)
Creatinine, Ser: 0.97 mg/dL (ref 0.40–1.20)
GFR: 66.21 mL/min (ref >60.00)
Glucose, Bld: 149 mg/dL — ABNORMAL HIGH (ref 70–99)
Potassium: 4.3 mEq/L (ref 3.5–5.1)
Sodium: 137 mEq/L (ref 135–145)

## 2022-04-14 LAB — HEMOGLOBIN A1C: Hgb A1c MFr Bld: 6.5 % (ref 4.6–6.5)

## 2022-04-16 ENCOUNTER — Encounter: Payer: Self-pay | Admitting: Endocrinology

## 2022-04-16 ENCOUNTER — Other Ambulatory Visit: Payer: Self-pay | Admitting: Endocrinology

## 2022-04-16 ENCOUNTER — Ambulatory Visit (INDEPENDENT_AMBULATORY_CARE_PROVIDER_SITE_OTHER): Payer: 59 | Admitting: Endocrinology

## 2022-04-16 VITALS — BP 118/76 | HR 81 | Ht 68.0 in | Wt 258.0 lb

## 2022-04-16 DIAGNOSIS — E039 Hypothyroidism, unspecified: Secondary | ICD-10-CM | POA: Diagnosis not present

## 2022-04-16 DIAGNOSIS — E1165 Type 2 diabetes mellitus with hyperglycemia: Secondary | ICD-10-CM | POA: Diagnosis not present

## 2022-04-16 DIAGNOSIS — E7800 Pure hypercholesterolemia, unspecified: Secondary | ICD-10-CM | POA: Diagnosis not present

## 2022-04-16 MED ORDER — OZEMPIC (0.25 OR 0.5 MG/DOSE) 2 MG/3ML ~~LOC~~ SOPN: 0.5000 mg | PEN_INJECTOR | SUBCUTANEOUS | 1 refills | Status: DC

## 2022-04-16 MED ORDER — LEVOTHYROXINE SODIUM 200 MCG PO TABS: 200.0000 ug | ORAL_TABLET | Freq: Every day | ORAL | 3 refills | Status: DC

## 2022-04-16 MED ORDER — ONETOUCH VERIO VI STRP: ORAL_STRIP | 3 refills | Status: DC

## 2022-04-16 MED ORDER — ONETOUCH VERIO W/DEVICE KIT: PACK | 0 refills | Status: DC

## 2022-04-16 MED ORDER — EMPAGLIFLOZIN 10 MG PO TABS: ORAL_TABLET | ORAL | 2 refills | Status: DC

## 2022-04-16 MED ORDER — SIMVASTATIN 10 MG PO TABS: 10.0000 mg | ORAL_TABLET | Freq: Every day | ORAL | 3 refills | Status: DC

## 2022-04-16 NOTE — Progress Notes (Signed)
Patient ID: Gail Barr, female   DOB: 10-29-1965, 57 y.o.   MRN: [id number]          Reason for Appointment: Endocrinology follow-up  Referring physician: Wilfred Lacy   History of Present Illness:          Date of diagnosis of type 2 diabetes mellitus:  2015    Background history:   She had an upper normal A1c of 6.5 in 2014 and then subsequently A1c was in the diabetic range Appears that her blood sugars have been poorly controlled with progressive rise in A1c Follow-up for diabetes has been infrequent and she has not had a consistent primary care provider also She had been treated with metformin initially Subsequently had been on Farxiga in 2016 and also Invokamet subsequently  In 2/19 she had marked hyperglycemia at the emergency room when seen for headache and blurred vision with glucose of 539.  Recent history:        Non-insulin hypoglycemic drugs:Ozempic 1 mg weekly, Jardiance 10 mg  A1c  is 6.5 compared to 6.2  Current management, blood sugar patterns and problems identified:  She had been continued on 1 mg Ozempic on her last visit in 3/23 However she has not come back for follow-up She still has some nausea from this but tolerable She was also told to take her Jardiance regularly Except for recently she thinks she has taken all her medications consistently Not doing any exercise Although she had lost weight last year it is almost the same as on her last visit She stated that she has been off her diet in December and has regained 8 pounds She thinks she had an episode of low blood sugar in the afternoon with not eating a full lunch and had symptoms of nausea and glucose of 54        Side effects from medications have been: Diarrhea, abdominal pain from metformin   Glucose monitoring:  As below       Glucometer: One Touch Verio.       Blood Glucose readings by recall:  Am 100-120, after meals up to 140  Previously:  PRE-MEAL Fasting Lunch Dinner  Bedtime Overall  Glucose range:       Mean/median: 80-140    ?   POST-MEAL PC Breakfast PC Lunch PC Dinner  Glucose range:     Mean/median:  126? Up to 220   Self-care: The diet that the patient has been following is: tries to limit high-fat foods.     Typical meal intake: Breakfast is eggs with spinach Lunch usually [place] salad and pita bread.  Dinner is rice or pasta dishes or a small snack               Dietician visit, most recent: 1/20  Weight history:  Wt Readings from Last 3 Encounters:  04/16/22 258 lb (117 kg)  05/29/21 255 lb 12.8 oz (116 kg)  12/03/20 269 lb (122 kg)   Glycemic control:   Lab Results  Component Value Date   HGBA1C 6.5 04/14/2022   HGBA1C 6.2 (A) 05/29/2021   HGBA1C 7.6 (H) 09/25/2020   Lab Results  Component Value Date   MICROALBUR 18.1 (H) 03/21/2020   LDLCALC 117 (H) 05/24/2021   CREATININE 0.97 04/14/2022   Lab Results  Component Value Date   MICRALBCREAT 18.7 03/21/2020    Lab Results  Component Value Date   FRUCTOSAMINE 237 05/24/2021   FRUCTOSAMINE 285 11/29/2020   FRUCTOSAMINE 258 05/14/2018  Problem 2:  HYPOTHYROIDISM  She was diagnosed to have hypothyroidism soon after her pregnancy around the year 1995 At that time she had a swelling in her thyroid and no other significant symptoms She was started on thyroid supplements and has been on these and variable doses over the last several years More recently she has been taking mostly 200 mcg She thinks that when her thyroid levels are lower when she is not taking her levothyroxine regularly she only has some difficulty with remembering things and some thinning of her hair but usually no cold intolerance or unusual fatigue She also tends to have more constipation  She has had variable TSH levels over the last couple of years She was recommended going up to 200 from the 175 dose on her last visit but she is taking this every other day only on her own Even with taking the  lower dose she has not complained of unusual fatigue recently  TSH not available  GOITER: She has had a goiter for several years and her ultrasound shows only heterogenous tissue and no nodules    Lab Results  Component Value Date   TSH 6.99 (H) 05/29/2021   TSH 9.15 (H) 05/24/2021   TSH 32.41 (H) 11/29/2020   FREET4 0.47 (L) 05/29/2021   FREET4 0.56 (L) 05/24/2021   FREET4 0.32 (L) 11/29/2020       Allergies as of 04/16/2022       Reactions   Betadine [povidone Iodine]    Sob, swelling   Ibuprofen Shortness Of Breath, Swelling, Anaphylaxis   And chest pain.  Tolerates naproxen without problems   Iodinated Contrast Media Anaphylaxis   Iodine Anaphylaxis   Iohexol     Desc: Pt states [place] 5 [place]rs [place] and experienced severe sob and swelling. We did her scan w/o iv contrast today.        Thanks., Onset Date: [id number]        Medication List        Accurate as of April 16, 2022 11:33 AM. If you have any questions, ask your nurse or doctor.          STOP taking these medications    Ozempic (1 MG/DOSE) 4 MG/3ML Sopn Generic drug: Semaglutide (1 MG/DOSE) Replaced by: Ozempic (0.25 or 0.5 MG/DOSE) 2 MG/3ML Sopn Stopped by: Elayne Snare, MD       TAKE these medications    empagliflozin 10 MG Tabs tablet Commonly known as: Jardiance TAKE 1 TABLET DAILY. PLEASE SCHEDULE FOLLOW-UP   Lancet Device Misc Check blood glucose 4 time daily (before meals and at bed time). E11.29. One touch Veiro meter. What changed: additional instructions   levothyroxine 200 MCG tablet Commonly known as: SYNTHROID Take 1 tablet (200 mcg total) by mouth daily.   MULTIVITAL PO Take by mouth as needed.   OneTouch Delica Lancets 54U Misc Use to check blood sugar once a day   OneTouch Verio test strip Generic drug: glucose blood USE AS INSTRUCTED TO CHECK BLOOD SUGAR ONCE A DAY   OneTouch Verio w/Device Kit Use to check blood sugar twice a day Started  by: Elayne Snare, MD   Ozempic (0.25 or 0.5 MG/DOSE) 2 MG/3ML Sopn Generic drug: Semaglutide(0.25 or 0.'5MG'$ /DOS) Inject 0.5 mg into the skin once a week. Replaces: Ozempic (1 MG/DOSE) 4 MG/3ML Sopn Started by: Elayne Snare, MD   simvastatin 10 MG tablet Commonly known as: ZOCOR Take 1 tablet (10 mg total) by mouth  at bedtime.        Allergies:  Allergies  Allergen Reactions   Betadine [Povidone Iodine]     Sob, swelling   Ibuprofen Shortness Of Breath, Swelling and Anaphylaxis    And chest pain.  Tolerates naproxen without problems   Iodinated Contrast Media Anaphylaxis   Iodine Anaphylaxis   Iohexol      Desc: Pt states she had a CT [place] approx 5 yrs ago and experienced severe sob and swelling. We did her scan w/o iv contrast today.        Thanks., Onset Date: [id number]     Past Medical History:  Diagnosis Date   Abnormal Pap smear early 2000's   had colposcopy, no additional treatment. normal paps since   Chronic kidney disease 2018   one kidney   Diabetes mellitus without complication (HCC)    Glaucoma    no eye drops   History of chickenpox    Hypothyroidism 1993   Multiple thyroid nodules    Pyelonephritis 1993   when pregnant; subsequently found to have shrunken, nonfunctioning kidney on one side   Unilateral renal atrophy     Past Surgical History:  Procedure Laterality Date   DILITATION & CURRETTAGE/HYSTROSCOPY WITH NOVASURE ABLATION N/A 03/02/2013   Procedure: DILATATION & CURETTAGE/HYSTEROSCOPY WITH NOVASURE ABLATION;  Surgeon: Olga Millers, MD;  Location: [facility];  Service: Gynecology;  Laterality: N/A;    Family History  Problem Relation Age of Onset   Asthma Brother    Diabetes Brother    Diabetes Maternal Aunt    Diabetes Maternal Uncle    Diabetes Maternal Grandmother    Cancer Maternal Grandfather        prostate cancer   Colon cancer Neg Hx    Rectal cancer Neg Hx    Stomach cancer Neg Hx     Social History:  reports that she has  never smoked. She has never used smokeless tobacco. She reports current alcohol use. She reports that she does not use drugs.   Review of Systems   Lipid history: Her baseline LDL has been high, last 125 when she was told to take pravastatin However she she was having nausea and stopped it She apparently also had nausea with Crestor which was tried in 01/2019 Again she also has nausea with LESCOL XL   Her lipids are generally not at goal She was told to take simvastatin 10 mg and she appears to have had 2 prescriptions filled last year but not now Recently not doing well on diet   Lab Results  Component Value Date   CHOL 177 05/24/2021   CHOL 189 09/25/2020   CHOL 196 03/21/2020   Lab Results  Component Value Date   HDL 35.20 (L) 05/24/2021   HDL 35.50 (L) 09/25/2020   HDL 38.80 (L) 03/21/2020   Lab Results  Component Value Date   LDLCALC 117 (H) 05/24/2021   LDLCALC 128 (H) 09/25/2020   LDLCALC 127 (H) 03/21/2020   Lab Results  Component Value Date   TRIG 125.0 05/24/2021   TRIG 129.0 09/25/2020   TRIG 153.0 (H) 03/21/2020   Lab Results  Component Value Date   CHOLHDL 5 05/24/2021   CHOLHDL 5 09/25/2020   CHOLHDL 5 03/21/2020   Lab Results  Component Value Date   LDLDIRECT 112.0 11/29/2020   LDLDIRECT 125.0 12/15/2018   LDLDIRECT 102.0 05/14/2018            Hypertension: Blood pressure is usually normal  without medications  Previously on ramipril  BP Readings from Last 3 Encounters:  04/16/22 118/76  05/29/21 122/78  12/03/20 138/80    Most recent eye exam was in 20200  She refuses influenza vaccine    LABS:  Lab on 04/14/2022  Component Date Value Ref Range Status   Hgb A1c MFr Bld 04/14/2022 6.5  4.6 - 6.5 % Final   Glycemic Control Guidelines for People with Diabetes:Non Diabetic:  <6%Goal of Therapy: <7%Additional Action Suggested:  >8%    Sodium 04/14/2022 137  135 - 145 mEq/L Final   Potassium 04/14/2022 4.3  3.5 - 5.1 mEq/L Final    Chloride 04/14/2022 106  96 - 112 mEq/L Final   CO2 04/14/2022 23  19 - 32 mEq/L Final   Glucose, Bld 04/14/2022 149 (H)  70 - 99 mg/dL Final   BUN 04/14/2022 17  6 - 23 mg/dL Final   Creatinine, Ser 04/14/2022 0.97  0.40 - 1.20 mg/dL Final   GFR 04/14/2022 66.21  >60.00 mL/min Final   Calculated using the CKD-EPI Creatinine Equation (2021)   Calcium 04/14/2022 9.0  8.4 - 10.5 mg/dL Final    Physical Examination:  BP 118/76   Pulse 81   Ht '5\' 8"'$  (1.727 m)   Wt 258 lb (117 kg)   SpO2 96%   BMI 39.23 kg/m    Diabetic Foot Exam - Simple   Simple Foot Form Diabetic Foot exam was performed with the following findings: Yes   Visual Inspection No deformities, no ulcerations, no other skin breakdown bilaterally: Yes Sensation Testing Intact to touch and monofilament testing bilaterally: Yes Pulse Check Posterior Tibialis and Dorsalis pulse intact bilaterally: Yes Comments     Thyroid enlarged about 2 times normal on the right side, smooth and slightly firm, left side only palpable immediately  ASSESSMENT:  Diabetes type 2, current BMI 39  See history of present illness for detailed discussion of current diabetes management, blood sugar patterns and problems identified  A1c is still relatively good at 6.5  Has benefited from [place] and [place] although recently out of her medication Also not clear how often she is monitoring her blood sugars Weight has fluctuated and recently has regained some weight that she had lost  HYPOTHYROIDISM: She has been taking equivalent of 100 mcg of levothyroxine instead of the 200 as recommended No recent labs available  Surprisingly is not symptomatic   LIPIDS: She has had hypercholesterolemia  which is persistent and she thinks she has nausea with all statin drugs, not clear if she tried simvastatin consistently that was prescribed on her last visit   PLAN:   She will resume Ozempic, for now we will start her on 0.5 mg because she  had nausea with 1 mg Continue taking Jardiance regularly More consistent glucose monitoring and need to bring a monitor for download Discussed need for further weight loss   Start walking or general exercising at least 4 days a week   For her hypothyroidism she will go up to 300 mg of levothyroxine Refill SIMVASTATIN 10 mg daily to take with her lunch meal, Cut back on sweets and high saturated fat foods For her thyroid she will go back to the 200 mcg levothyroxine instead of 175   There are no Patient Instructions on file for this visit.        Elayne Snare 04/16/2022, 11:33 AM   Note: This office note was prepared with Dragon voice recognition system technology. Any transcriptional errors that result  from this process are unintentional.

## 2022-04-17 ENCOUNTER — Telehealth: Payer: Self-pay

## 2022-04-17 NOTE — Telephone Encounter (Signed)
Trulicity not covered, alternative requested.

## 2022-04-18 ENCOUNTER — Telehealth: Payer: Self-pay

## 2022-04-18 ENCOUNTER — Other Ambulatory Visit (HOSPITAL_COMMUNITY): Payer: Self-pay

## 2022-04-18 NOTE — Telephone Encounter (Signed)
I don't know if the pharmacy did something wrong when they sent this or what, but I get  $0 copay when I do a test claim. Maybe it was too soon or something when they first ran it? I'll be happy to call the pharmacy, unless you would like her to be on something else.

## 2022-04-18 NOTE — Telephone Encounter (Signed)
Ozempic is not covered but Trulicity is covered

## 2022-04-18 NOTE — Telephone Encounter (Signed)
Per PA team the Trulicity is covered with no co-pay. Which strength would you like me to send in.

## 2022-04-25 ENCOUNTER — Other Ambulatory Visit (HOSPITAL_COMMUNITY): Payer: Self-pay

## 2022-05-02 NOTE — Telephone Encounter (Signed)
Can we get a update on what's on formulary

## 2022-05-02 NOTE — Telephone Encounter (Signed)
Disregard message

## 2022-05-06 ENCOUNTER — Other Ambulatory Visit (HOSPITAL_COMMUNITY): Payer: Self-pay

## 2022-05-12 ENCOUNTER — Other Ambulatory Visit: Payer: Self-pay

## 2022-05-12 DIAGNOSIS — E1165 Type 2 diabetes mellitus with hyperglycemia: Secondary | ICD-10-CM

## 2022-05-12 MED ORDER — EMPAGLIFLOZIN 10 MG PO TABS: ORAL_TABLET | ORAL | 2 refills | Status: DC

## 2022-05-12 MED ORDER — TRULICITY 0.75 MG/0.5ML ~~LOC~~ SOAJ: 0.7500 mg | SUBCUTANEOUS | 3 refills | Status: DC

## 2022-05-16 ENCOUNTER — Telehealth: Payer: Self-pay

## 2022-05-16 NOTE — Telephone Encounter (Signed)
Can we get update on PA for Jardiance and Trulicity. Patient is out of medication.

## 2022-05-19 ENCOUNTER — Other Ambulatory Visit (HOSPITAL_COMMUNITY): Payer: Self-pay

## 2022-05-19 ENCOUNTER — Telehealth: Payer: Self-pay | Admitting: Pharmacy Technician

## 2022-05-19 NOTE — Telephone Encounter (Signed)
Pharmacy Patient Advocate Encounter   Received notification from Pt calls msgs/CMA that prior authorization for Jardiance and Trulicity 0.'75mg'$  is required/requested.  Per Test Claim: Both go through for $0 copay on the [employer] ins. The info for that card matches what the Dr. Gabriel Carina has, but since 2 things come up with an eligibility check I called the pt to verify. Left a HIPAA compliant VM to verify the correct ins.  Called the pharmacy to see which ins they were billing. They were billing [employer], but when they billed [employer] it went through. She said something about their system having a data breach last week, so maybe that's why it kicked it over to the other ins.

## 2022-06-09 LAB — HM DIABETES EYE EXAM

## 2022-07-03 ENCOUNTER — Other Ambulatory Visit (HOSPITAL_COMMUNITY): Payer: Self-pay

## 2022-07-18 ENCOUNTER — Other Ambulatory Visit: Payer: 59

## 2022-07-22 ENCOUNTER — Encounter: Payer: Self-pay | Admitting: Endocrinology

## 2022-07-22 ENCOUNTER — Other Ambulatory Visit: Payer: Self-pay | Admitting: Endocrinology

## 2022-07-22 ENCOUNTER — Ambulatory Visit (INDEPENDENT_AMBULATORY_CARE_PROVIDER_SITE_OTHER): Payer: 59 | Admitting: Endocrinology

## 2022-07-22 VITALS — BP 108/62 | HR 66 | Ht 68.0 in | Wt 246.0 lb

## 2022-07-22 DIAGNOSIS — E1165 Type 2 diabetes mellitus with hyperglycemia: Secondary | ICD-10-CM

## 2022-07-22 DIAGNOSIS — E7800 Pure hypercholesterolemia, unspecified: Secondary | ICD-10-CM

## 2022-07-22 DIAGNOSIS — E039 Hypothyroidism, unspecified: Secondary | ICD-10-CM | POA: Diagnosis not present

## 2022-07-22 LAB — LIPID PANEL
Cholesterol: 233 mg/dL — ABNORMAL HIGH (ref 0–200)
HDL: 40.3 mg/dL (ref >39.00)
LDL Cholesterol: 163 mg/dL — ABNORMAL HIGH (ref 0–99)
NonHDL: 192.31
Total CHOL/HDL Ratio: 6
Triglycerides: 149 mg/dL (ref 0.0–149.0)
VLDL: 29.8 mg/dL (ref 0.0–40.0)

## 2022-07-22 LAB — COMPREHENSIVE METABOLIC PANEL
ALT: 19 U/L (ref 0–35)
AST: 18 U/L (ref 0–37)
Albumin: 4.3 g/dL (ref 3.5–5.2)
Alkaline Phosphatase: 58 U/L (ref 39–117)
BUN: 14 mg/dL (ref 6–23)
CO2: 26 mEq/L (ref 19–32)
Calcium: 9.9 mg/dL (ref 8.4–10.5)
Chloride: 103 mEq/L (ref 96–112)
Creatinine, Ser: 1.17 mg/dL (ref 0.40–1.20)
GFR: 52.77 mL/min — ABNORMAL LOW (ref >60.00)
Glucose, Bld: 187 mg/dL — ABNORMAL HIGH (ref 70–99)
Potassium: 4.1 mEq/L (ref 3.5–5.1)
Sodium: 138 mEq/L (ref 135–145)
Total Bilirubin: 0.7 mg/dL (ref 0.2–1.2)
Total Protein: 8.3 g/dL (ref 6.0–8.3)

## 2022-07-22 LAB — MICROALBUMIN / CREATININE URINE RATIO
Creatinine,U: 72.7 mg/dL
Microalb Creat Ratio: 6.2 mg/g (ref 0.0–30.0)
Microalb, Ur: 4.5 mg/dL — ABNORMAL HIGH (ref 0.0–1.9)

## 2022-07-22 LAB — POCT GLYCOSYLATED HEMOGLOBIN (HGB A1C): Hemoglobin A1C: 7.1 % — AB (ref 4.0–5.6)

## 2022-07-22 LAB — TSH: TSH: 10.35 u[IU]/mL — ABNORMAL HIGH (ref 0.35–5.50)

## 2022-07-22 LAB — T4, FREE: Free T4: 0.57 ng/dL — ABNORMAL LOW (ref 0.60–1.60)

## 2022-07-22 MED ORDER — OZEMPIC (0.25 OR 0.5 MG/DOSE) 2 MG/3ML ~~LOC~~ SOPN: 0.5000 mg | PEN_INJECTOR | SUBCUTANEOUS | 1 refills | Status: DC

## 2022-07-22 NOTE — Patient Instructions (Addendum)
Start OZEMPIC injections by dialing 0.25 mg on the pen as shown once weekly on the same day of the week.  You will feel fullness of the stomach with starting the medication and should try to keep the portions at meals small.  You may experience nausea in the first few days which usually gets better over time    After 4 weeks increase the dose to 0.5 mg weekly  If you have any questions or persistent side effects please call the office   You may also talk to a nurse educator with [employer] at [phone] Useful website: [email or web address]  Fax [phone] eye exam

## 2022-07-22 NOTE — Progress Notes (Signed)
Patient ID: Gail Barr, female   DOB: Aug 13, 1965, 57 y.o.   MRN: [id number]          Reason for Appointment: Endocrinology follow-up  Referring physician: Alysia Penna   History of Present Illness:          Date of diagnosis of type 2 diabetes mellitus:  2015    Background history:   She had an upper normal A1c of 6.5 in 2014 and then subsequently A1c was in the diabetic range Appears that her blood sugars have been poorly controlled with progressive rise in A1c Follow-up for diabetes has been infrequent and she has not had a consistent primary care provider also She had been treated with metformin initially Subsequently had been on Farxiga in 2016 and also Invokamet subsequently  In 2/19 she had marked hyperglycemia at the emergency room when seen for headache and blurred vision with glucose of 539.  Recent history:        Non-insulin hypoglycemic drugs:, Jardiance 10 mg  A1c  is 7.1 compared to as low as 6.2 previously  Current management, blood sugar patterns and problems identified:  She stopped Trulicity because of nausea and vomiting Previously was on Ozempic but pharmacy had indicated that this was not covered by insurance and she was switched to Trulicity She had done fairly well with Ozempic although she had mild nausea with the 1 mg dose However she has cut back on her portions and is starting to walk for exercise With this her weight is down 11 pounds  She is however not checking her blood sugars regularly and most recent blood sugar was 263 midmorning She does think that initially when she was not able to continue her Ozempic her blood sugars were in the 200+ range Only taking Jardiance without side effects as before        Side effects from medications have been: Diarrhea, abdominal pain from metformin   Glucose monitoring:  As below       Glucometer: One Touch Verio.       Blood Glucose readings by download:   PRE-MEAL Fasting Lunch Dinner Bedtime  Overall  Glucose range: 111 163     Mean/median:     174   POST-MEAL PC Breakfast PC Lunch PC Dinner  Glucose range: 158-263    Mean/median:       Prior  Am 100-120, after meals up to 140  Self-care: The diet that the patient has been following is: tries to limit high-fat foods.     Typical meal intake: Breakfast is eggs with spinach Lunch usually [place] salad and pita bread.  Dinner is rice or pasta dishes or a small snack               Dietician visit, most recent: 1/20  Weight history:  Wt Readings from Last 3 Encounters:  07/22/22 246 lb (111.6 kg)  04/16/22 258 lb (117 kg)  05/29/21 255 lb 12.8 oz (116 kg)   Glycemic control:   Lab Results  Component Value Date   HGBA1C 7.1 (A) 07/22/2022   HGBA1C 6.5 04/14/2022   HGBA1C 6.2 (A) 05/29/2021   Lab Results  Component Value Date   MICROALBUR 18.1 (H) 03/21/2020   LDLCALC 117 (H) 05/24/2021   CREATININE 0.97 04/14/2022   Lab Results  Component Value Date   MICRALBCREAT 18.7 03/21/2020    Lab Results  Component Value Date   FRUCTOSAMINE 237 05/24/2021   FRUCTOSAMINE 285 11/29/2020   FRUCTOSAMINE 258 05/14/2018  Problem 2:  HYPOTHYROIDISM  She was diagnosed to have hypothyroidism soon after her pregnancy around the year 1995 At that time she had a swelling in her thyroid and no other significant symptoms She was started on thyroid supplements and has been on these and variable doses over the last several years More recently she has been taking mostly 200 mcg She thinks that when her thyroid levels are lower when she is not taking her levothyroxine regularly she only has some difficulty with remembering things and some thinning of her hair but usually no cold intolerance or unusual fatigue She also tends to have more constipation  She has had variable TSH levels over the last couple of years She was recommended going up to 200 micrograms dose on the last visit, previously had been taking somewhat arbitrary  doses No unusual fatigue She has lost weight from diet and exercise  TSH not available  GOITER: She has had a goiter for several years and her ultrasound shows only heterogenous tissue and no nodules    Lab Results  Component Value Date   TSH 6.99 (H) 05/29/2021   TSH 9.15 (H) 05/24/2021   TSH 32.41 (H) 11/29/2020   FREET4 0.47 (L) 05/29/2021   FREET4 0.56 (L) 05/24/2021   FREET4 0.32 (L) 11/29/2020       Allergies as of 07/22/2022       Reactions   Betadine [povidone Iodine]    Sob, swelling   Ibuprofen Shortness Of Breath, Swelling, Anaphylaxis   And chest pain.  Tolerates naproxen without problems   Iodinated Contrast Media Anaphylaxis   Iodine Anaphylaxis   Iohexol     Desc: Pt states [place] 5 [place] and experienced severe sob and swelling. We did her scan w/o iv contrast today.        Thanks., Onset Date: 16109604        Medication List        Accurate as of July 22, 2022 11:49 AM. If you have any questions, ask your nurse or doctor.          STOP taking these medications    Lancet Device Misc Stopped by: Reather Littler, MD   OneTouch Delica Lancets 33G Misc Stopped by: Reather Littler, MD   OneTouch Verio test strip Generic drug: glucose blood Stopped by: Reather Littler, MD   OneTouch Verio w/Device Kit Stopped by: Reather Littler, MD   Trulicity 0.75 MG/0.5ML Sopn Generic drug: Dulaglutide Stopped by: Reather Littler, MD       TAKE these medications    empagliflozin 10 MG Tabs tablet Commonly known as: Jardiance TAKE 1 TABLET DAILY. PLEASE SCHEDULE FOLLOW-UP   levothyroxine 200 MCG tablet Commonly known as: SYNTHROID Take 1 tablet (200 mcg total) by mouth daily.   Magnesium 500 MG Tabs Take 1 tablet by mouth daily.   MULTIVITAL PO Take by mouth as needed.   Ozempic (0.25 or 0.5 MG/DOSE) 2 MG/3ML Sopn Generic drug: Semaglutide(0.25 or 0.5MG /DOS) Inject 0.5 mg into the skin once a week. Started by: Reather Littler, MD    simvastatin 10 MG tablet Commonly known as: ZOCOR Take 1 tablet (10 mg total) by mouth at bedtime.   Vitamin D3 50 MCG (2000 UT) capsule Take 2,000 Units by mouth daily.        Allergies:  Allergies  Allergen Reactions   Betadine [Povidone Iodine]     Sob, swelling   Ibuprofen Shortness Of Breath, Swelling and Anaphylaxis  And chest pain.  Tolerates naproxen without problems   Iodinated Contrast Media Anaphylaxis   Iodine Anaphylaxis   Iohexol      Desc: Pt states she had a [place] approx 5 yrs ago and experienced severe sob and swelling. We did her scan w/o iv contrast today.        Thanks., Onset Date: 16109604     Past Medical History:  Diagnosis Date   Abnormal Pap smear early 2000's   had colposcopy, no additional treatment. normal paps since   Chronic kidney disease 2018   one kidney   Diabetes mellitus without complication (HCC)    Glaucoma    no eye drops   History of chickenpox    Hypothyroidism 1993   Multiple thyroid nodules    Pyelonephritis 1993   when pregnant; subsequently found to have shrunken, nonfunctioning kidney on one side   Unilateral renal atrophy     Past Surgical History:  Procedure Laterality Date   DILITATION & CURRETTAGE/HYSTROSCOPY WITH NOVASURE ABLATION N/A 03/02/2013   Procedure: DILATATION & CURETTAGE/HYSTEROSCOPY WITH NOVASURE ABLATION;  Surgeon: Levi Aland, MD;  Location: [facility];  Service: Gynecology;  Laterality: N/A;    Family History  Problem Relation Age of Onset   Asthma Brother    Diabetes Brother    Diabetes Maternal Aunt    Diabetes Maternal Uncle    Diabetes Maternal Grandmother    Cancer Maternal Grandfather        prostate cancer   Colon cancer Neg Hx    Rectal cancer Neg Hx    Stomach cancer Neg Hx     Social History:  reports that she has never smoked. She has never used smokeless tobacco. She reports current alcohol use. She reports that she does not use drugs.   Review of  Systems   Lipid history: Her baseline LDL has been high, last 125 when she was told to take pravastatin However she she was having nausea and stopped it She apparently also had nausea with Crestor which was tried in 01/2019 Again she also has nausea with LESCOL XL   Her lipids are generally not at goal She was told to take simvastatin 10 mg and she just started this after her last visit but only took it for 30 days   Lab Results  Component Value Date   CHOL 177 05/24/2021   CHOL 189 09/25/2020   CHOL 196 03/21/2020   Lab Results  Component Value Date   HDL 35.20 (L) 05/24/2021   HDL 35.50 (L) 09/25/2020   HDL 38.80 (L) 03/21/2020   Lab Results  Component Value Date   LDLCALC 117 (H) 05/24/2021   LDLCALC 128 (H) 09/25/2020   LDLCALC 127 (H) 03/21/2020   Lab Results  Component Value Date   TRIG 125.0 05/24/2021   TRIG 129.0 09/25/2020   TRIG 153.0 (H) 03/21/2020   Lab Results  Component Value Date   CHOLHDL 5 05/24/2021   CHOLHDL 5 09/25/2020   CHOLHDL 5 03/21/2020   Lab Results  Component Value Date   LDLDIRECT 112.0 11/29/2020   LDLDIRECT 125.0 12/15/2018   LDLDIRECT 102.0 05/14/2018            Hypertension: Blood pressure is usually normal without medications  Previously on ramipril  BP Readings from Last 3 Encounters:  07/22/22 108/62  04/16/22 118/76  05/29/21 122/78    Most recent eye exam was in 2024  She refuses influenza vaccine    LABS:  Office Visit on 07/22/2022  Component Date Value Ref Range Status   Hemoglobin A1C 07/22/2022 7.1 (A)  4.0 - 5.6 % Final    Physical Examination:  BP 108/62   Pulse 66   Ht 5\' 8"  (1.727 m)   Wt 246 lb (111.6 kg)   SpO2 98%   BMI 37.40 kg/m    Diabetic Foot Exam - Simple   Simple Foot Form Diabetic Foot exam was performed with the following findings: Yes   Visual Inspection No deformities, no ulcerations, no other skin breakdown bilaterally: Yes Sensation Testing Intact to touch and  monofilament testing bilaterally: Yes Pulse Check Posterior Tibialis and Dorsalis pulse intact bilaterally: Yes Comments     No pedal edema  ASSESSMENT:  Diabetes type 2, current BMI 37  See history of present illness for detailed discussion of current diabetes management, blood sugar patterns and problems identified  A1c is up to 7.1  She had benefited from Ozempic along with Jardiance Recent blood sugars are higher from not taking any GLP-1 drugs Her pharmacy indicated that Trulicity was covered in place of Ozempic but she is intolerant to Trulicity  However with lifestyle changes her weight is coming down and now 12 pounds less than her last visit However recent home blood sugars look higher  HYPOTHYROIDISM: She has been taking levothyroxine 200 micrograms as recommended No recent labs available   LIPIDS: She has had hypercholesterolemia and will recheck labs today, will need to make sure she takes her simvastatin regularly  PLAN:  She will resume Ozempic and let us know if this is not covered Since the medication was causing nausea at the 1 mg dose we will start again with 0.25 and go up to 0.5 in 1 month but continue this till her next visit Continue increasing walking for exercise More regular monitoring of blood sugars especially after meals Continue to watch portions and types of meals and avoid excessive sweet foods  For her hypothyroidism she will have thyroid levels checked today to decide on dose of her medication Consider restarting SIMVASTATIN if lipids are high today Urine microalbumin She will call to get [place] report of her eye exam done recently  Patient Instructions  Start OZEMPIC injections by dialing 0.25 mg on the pen as shown once weekly on the same day of the week.  You will feel fullness of the stomach with starting the medication and should try to keep the portions at meals small.  You may experience nausea in the first few days which usually gets  better over time    After 4 weeks increase the dose to 0.5 mg weekly  If you have any questions or persistent side effects please call the office   You may also talk to a nurse educator with [employer] at [phone] Useful website: [email or web address]  Fax [phone] eye exam        Reather Littler 07/22/2022, 11:49 AM   Note: This office note was prepared with Dragon voice recognition system technology. Any transcriptional errors that result from this process are unintentional.

## 2022-07-23 ENCOUNTER — Telehealth: Payer: Self-pay

## 2022-07-24 ENCOUNTER — Other Ambulatory Visit (HOSPITAL_COMMUNITY): Payer: Self-pay

## 2022-07-24 NOTE — Telephone Encounter (Signed)
Per test claim PA is not needed.   

## 2022-07-24 NOTE — Progress Notes (Signed)
Thyroid level is low, if she has been taking the 200 mcg levothyroxine consistently daily needs to add 1 more tablet per week.  Also cholesterol is very high, has she been taking the simvastatin?

## 2022-07-24 NOTE — Telephone Encounter (Signed)
Pharmacy confirmed no pa needed.

## 2022-07-28 ENCOUNTER — Telehealth: Payer: Self-pay | Admitting: Pharmacy Technician

## 2022-07-28 ENCOUNTER — Other Ambulatory Visit (HOSPITAL_COMMUNITY): Payer: Self-pay

## 2022-07-28 NOTE — Telephone Encounter (Signed)
Pharmacy Patient Advocate Encounter  Received notification from RX req msgs/CMA that prior authorization for Ozempic is required/requested. Pt was intolerant to Trulicity and Metformin. See office notes from 07/22/22.  Per Test Claim: Ozempic was filled 07/24/22. Next eligible fill date is 08/14/22    Relayed info to CMA

## 2022-07-31 ENCOUNTER — Encounter: Payer: Self-pay | Admitting: Endocrinology

## 2022-08-08 ENCOUNTER — Encounter: Payer: Self-pay | Admitting: Endocrinology

## 2022-09-19 ENCOUNTER — Other Ambulatory Visit: Payer: Self-pay | Admitting: Endocrinology

## 2022-10-13 ENCOUNTER — Other Ambulatory Visit: Payer: 59

## 2022-10-21 ENCOUNTER — Ambulatory Visit: Payer: 59 | Admitting: Endocrinology

## 2022-11-20 ENCOUNTER — Other Ambulatory Visit: Payer: Self-pay | Admitting: Endocrinology

## 2023-01-21 ENCOUNTER — Other Ambulatory Visit: Payer: Self-pay

## 2023-01-21 DIAGNOSIS — E039 Hypothyroidism, unspecified: Secondary | ICD-10-CM

## 2023-01-21 DIAGNOSIS — E1165 Type 2 diabetes mellitus with hyperglycemia: Secondary | ICD-10-CM

## 2023-01-21 DIAGNOSIS — E7800 Pure hypercholesterolemia, unspecified: Secondary | ICD-10-CM

## 2023-01-27 ENCOUNTER — Other Ambulatory Visit (INDEPENDENT_AMBULATORY_CARE_PROVIDER_SITE_OTHER): Payer: 59

## 2023-01-27 DIAGNOSIS — E1165 Type 2 diabetes mellitus with hyperglycemia: Secondary | ICD-10-CM

## 2023-01-27 DIAGNOSIS — E039 Hypothyroidism, unspecified: Secondary | ICD-10-CM

## 2023-01-27 DIAGNOSIS — E7800 Pure hypercholesterolemia, unspecified: Secondary | ICD-10-CM | POA: Diagnosis not present

## 2023-01-27 LAB — TSH: TSH: 9.94 u[IU]/mL — ABNORMAL HIGH (ref 0.35–5.50)

## 2023-01-27 LAB — BASIC METABOLIC PANEL
BUN: 11 mg/dL (ref 6–23)
CO2: 25 meq/L (ref 19–32)
Calcium: 9.7 mg/dL (ref 8.4–10.5)
Chloride: 104 meq/L (ref 96–112)
Creatinine, Ser: 1.13 mg/dL (ref 0.40–1.20)
GFR: 54.82 mL/min — ABNORMAL LOW (ref >60.00)
Glucose, Bld: 203 mg/dL — ABNORMAL HIGH (ref 70–99)
Potassium: 3.8 meq/L (ref 3.5–5.1)
Sodium: 137 meq/L (ref 135–145)

## 2023-01-27 LAB — HEMOGLOBIN A1C: Hgb A1c MFr Bld: 7.6 % — ABNORMAL HIGH (ref 4.6–6.5)

## 2023-01-28 ENCOUNTER — Telehealth: Payer: Self-pay

## 2023-01-28 NOTE — Telephone Encounter (Signed)
Patient made aware MD wanted patient to keep upcoming appointment to discuss thyroid med adjustments based on recent labs.

## 2023-01-28 NOTE — Telephone Encounter (Signed)
-----   Message from [place] sent at 01/28/2023  5:31 AM EST ----- Labs reviewed TSH is still high we will adjust the dose of levothyroxine on follow-up visit on November 12.  Please ask to keep the appointment.

## 2023-02-03 ENCOUNTER — Encounter: Payer: Self-pay | Admitting: Endocrinology

## 2023-02-03 ENCOUNTER — Ambulatory Visit (INDEPENDENT_AMBULATORY_CARE_PROVIDER_SITE_OTHER): Payer: 59 | Admitting: Endocrinology

## 2023-02-03 DIAGNOSIS — E039 Hypothyroidism, unspecified: Secondary | ICD-10-CM

## 2023-02-03 DIAGNOSIS — Z7984 Long term (current) use of oral hypoglycemic drugs: Secondary | ICD-10-CM

## 2023-02-03 DIAGNOSIS — E1165 Type 2 diabetes mellitus with hyperglycemia: Secondary | ICD-10-CM | POA: Diagnosis not present

## 2023-02-03 MED ORDER — EMPAGLIFLOZIN 10 MG PO TABS
ORAL_TABLET | ORAL | 3 refills | Status: AC
Start: 2023-02-03 — End: ?

## 2023-02-03 MED ORDER — SEMAGLUTIDE(0.25 OR 0.5MG/DOS) 2 MG/3ML ~~LOC~~ SOPN
PEN_INJECTOR | SUBCUTANEOUS | 4 refills | Status: DC
Start: 2023-02-03 — End: 2023-09-21

## 2023-02-03 MED ORDER — LEVOTHYROXINE SODIUM 200 MCG PO TABS
200.0000 ug | ORAL_TABLET | Freq: Every day | ORAL | 3 refills | Status: AC
Start: 2023-02-03 — End: ?

## 2023-02-03 MED ORDER — SIMVASTATIN 10 MG PO TABS: 10.0000 mg | ORAL_TABLET | Freq: Every day | ORAL | 3 refills | Status: AC

## 2023-02-03 NOTE — Patient Instructions (Signed)
Diabetes regimen: Restart Jardiance 10 mg daily. Ozempic 0.25 mg weekly for 4 week and increase to 0.5 mg daily.   Levothyroxine 200 mcg daily.

## 2023-02-03 NOTE — Progress Notes (Unsigned)
Outpatient Endocrinology Note Gail Hayley Horn, MD  02/04/23  Patient's Name: Gail Barr    DOB: 09-23-1965    MRN: [id number]                                                    REASON OF VISIT: Follow up of type 2 diabetes mellitus and hypothyroidism  PCP: Eartha Inch, MD  HISTORY OF PRESENT ILLNESS:   Gail Barr is a 57 y.o. old female with past medical history listed below, is here for follow up for type 2 diabetes mellitus and hypothyroidism.  Patient was last seen by Dr. Lucianne Muss in April 2024.  Pertinent Diabetes History: Patient was diagnosed with type 2 diabetes mellitus in 2014/2015.  She has not been on insulin therapy.  Chronic Diabetes Complications : Retinopathy: no. Last ophthalmology exam was done on 05/2022, following with ophthalmology regularly.  Nephropathy: no, atrophy of one of the kidneys due to ? pyelonephritis. Peripheral neuropathy: no Coronary artery disease: no Stroke: no  Relevant comorbidities and cardiovascular risk factors: Obesity: yes Body mass index is 36.77 kg/m.  Hypertension: no Hyperlipidemia : Yes, on statin   Her baseline LDL has been high, when she was told to take pravastatin. However she she was having nausea and stopped it. She apparently also had nausea with Crestor which was tried in 01/2019. Again she also has nausea with LESCOL XL. Her lipids are generally not at goal She was told to take simvastatin 10 mg.  Current / Home Diabetic regimen includes: Jardiance 10 mg daily. Ozempic 0.5 mg weekly.  Patient currently not taking Jardiance and Ozempic for at least 3 months.  Prior diabetic medications: Metformin caused nausea and vomiting and is stopped.  She had taken Invokana and Invokamet in the past.  Trulicity was stopped due to nausea and vomiting.  She has mild nausea with Ozempic of 1 mg weekly.  Glycemic data:   She has not been checking blood sugar lately.  Hypoglycemia: Patient has no hypoglycemic episodes. Patient  has hypoglycemia awareness.  Factors modifying glucose control: 1.  Diabetic diet assessment: 3 meals a day.  2.  Staying active or exercising: No formal exercise.  3.  Medication compliance: compliant none of the time.  # Primary hypothyroidism -Patient was diagnosed with hypothyroidism soon after her pregnancy around the year of 1995.  Levothyroxine dose has been adjusted periodically over the time.  She has some issues with compliance.  # GOITER: She has had a goiter for several years and her ultrasound shows only heterogenous tissue and no nodules   Interval history  Patient went to [place], has not been taking any other diabetic medication.  She is only taking herbal medication over-the-counter.  She had lab work with primary care provider in September and had elevated urine microalbumin creatinine ratio of 93.   LDL was 137 elevated.  Recent hemoglobin A1c 7.6%.  She is currently on levothyroxine 200 mcg daily.  She reports she has not missed a couple of times in last few weeks, not fully compliant.  Recent TSH of 9.94 as noted below.   Latest Reference Range & Units 01/27/23 08:52  TSH 0.35 - 5.50 uIU/mL 9.94 (H)  (H): Data is abnormally high  REVIEW OF SYSTEMS As per history of present illness.   PAST MEDICAL HISTORY: Past Medical History:  Diagnosis  Date   Abnormal Pap smear early 2000's   had colposcopy, no additional treatment. normal paps since   Chronic kidney disease 2018   one kidney   Diabetes mellitus without complication (HCC)    Glaucoma    no eye drops   History of chickenpox    Hypothyroidism 1993   Multiple thyroid nodules    Pyelonephritis 1993   when pregnant; subsequently found to have shrunken, nonfunctioning kidney on one side   Unilateral renal atrophy     PAST SURGICAL HISTORY: Past Surgical History:  Procedure Laterality Date   DILITATION & CURRETTAGE/HYSTROSCOPY WITH NOVASURE ABLATION N/A 03/02/2013   Procedure: DILATATION &  CURETTAGE/HYSTEROSCOPY WITH NOVASURE ABLATION;  Surgeon: Levi Aland, MD;  Location: [facility];  Service: Gynecology;  Laterality: N/A;    ALLERGIES: Allergies  Allergen Reactions   Betadine [Povidone Iodine]     Sob, swelling   Ibuprofen Shortness Of Breath, Swelling and Anaphylaxis    And chest pain.  Tolerates naproxen without problems   Iodinated Contrast Media Anaphylaxis   Iodine Anaphylaxis   Iohexol      Desc: Pt states [place]x 5 [place]rs [place] and experienced severe sob and swelling. We did her scan w/o iv contrast today.        Thanks., Onset Date: [id number]     FAMILY HISTORY:  Family History  Problem Relation Age of Onset   Asthma Brother    Diabetes Brother    Diabetes Maternal Aunt    Diabetes Maternal Uncle    Diabetes Maternal Grandmother    Cancer Maternal Grandfather        prostate cancer   Colon cancer Neg Hx    Rectal cancer Neg Hx    Stomach cancer Neg Hx     SOCIAL HISTORY: Social History   Socioeconomic History   Marital status: Married    Spouse name: Thelma Barge   Number of children: 3   Years of education: 16   Highest education level: Not on file  Occupational History   Occupation: Production designer, theatre/television/film for [employer]  Tobacco Use   Smoking status: Never   Smokeless tobacco: Never  Vaping Use   Vaping status: Never Used  Substance and Sexual Activity   Alcohol use: Yes    Comment: one drink every three months.   Drug use: No   Sexual activity: Yes    Partners: Male    Birth control/protection: None  Other Topics Concern   Not on file  Social History Narrative   Lives with husband   Son is a police office in [place], has 1 grandchild.     Daughter is a Theatre stage manager in [place].     She moved here in 2005 from [place]   Caffeine use: none   Social Determinants of Health   Financial Resource Strain: Low Risk  (12/16/2022)   Received from [facility]   Overall Financial Resource Strain (CARDIA)    Difficulty of Paying Living  Expenses: Not hard at all  Food Insecurity: No Food Insecurity (12/16/2022)   Received from [facility]   Hunger Vital Sign    Worried About Running Out of Food in the Last Year: Never true    Ran Out of Food in the Last Year: Never true  Transportation Needs: No Transportation Needs (12/16/2022)   Received from [facility] - Transportation    Lack of Transportation (Medical): No    Lack of Transportation (Non-Medical): No  Physical Activity: Inactive (02/04/2021)   [facility]   Exercise Vital Sign    Days of Exercise per Week: 0 days    Minutes of Exercise per Session: 0 min  Stress: Stress Concern Present (02/04/2021)   Received from [facility] - Occupational Stress Questionnaire    Feeling of Stress : To some extent  Social Connections: Unknown (07/25/2021)   [facility] from [facility]   Social Network    Social Network: Not on file    MEDICATIONS:  Current Outpatient Medications  Medication Sig Dispense Refill   Cholecalciferol (VITAMIN D3) 50 MCG (2000 UT) capsule Take 2,000 Units by mouth daily.     Magnesium 500 MG TABS Take 1 tablet by mouth daily.     Multiple Vitamins-Minerals (MULTIVITAL PO) Take by mouth as needed.      Semaglutide,0.25 or 0.5MG /DOS, (OZEMPIC, 0.25 OR 0.5 MG/DOSE,) 2 MG/3ML SOPN INJECT 0.5 MG INTO THE SKIN ONE TIME PER WEEK 3 mL 1   Semaglutide,0.25 or 0.5MG /DOS, 2 MG/3ML SOPN Inject 0.25 mg into the skin once a week for 4 weeks and increase to 0.5 mg weekly. 3 mL 4   empagliflozin (JARDIANCE) 10 MG TABS tablet TAKE 1 TABLET DAILY. PLEASE SCHEDULE FOLLOW-UP 90 tablet 3   levothyroxine (SYNTHROID) 200 MCG tablet Take 1 tablet (200 mcg total) by mouth daily. 90 tablet 3   simvastatin (ZOCOR) 10 MG tablet Take 1 tablet (10 mg total) by mouth at bedtime. 90 tablet 3   No current facility-administered medications for this visit.     PHYSICAL EXAM: Vitals:   02/03/23 1556  BP: 120/80  Pulse: 78  Resp: 20  SpO2: 98%  Weight: 241 lb 12.8 oz (109.7 kg)  Height: 5\' 8"  (1.727 m)   Body mass index is 36.77 kg/m.  Wt Readings from Last 3 Encounters:  02/03/23 241 lb 12.8 oz (109.7 kg)  07/22/22 246 lb (111.6 kg)  04/16/22 258 lb (117 kg)    General: Well developed, well nourished female in no apparent distress.  HEENT: AT/[place], no external lesions.  Eyes: Conjunctiva clear and no icterus. Neck: Neck supple  Lungs: Respirations not labored Neurologic: Alert, oriented, normal speech Extremities / Skin: Dry. No sores or rashes noted.  Psychiatric: Does not appear depressed or anxious  Diabetic Foot Exam - Simple   No data filed    LABS Reviewed Lab Results  Component Value Date   HGBA1C 7.6 (H) 01/27/2023   HGBA1C 7.1 (A) 07/22/2022   HGBA1C 6.5 04/14/2022   Lab Results  Component Value Date   FRUCTOSAMINE 237 05/24/2021   FRUCTOSAMINE 285 11/29/2020   FRUCTOSAMINE 258 05/14/2018   Lab Results  Component Value Date   CHOL 233 (H) 07/22/2022   HDL 40.30 07/22/2022   LDLCALC 163 (H) 07/22/2022   LDLDIRECT 112.0 11/29/2020   TRIG 149.0 07/22/2022   CHOLHDL 6 07/22/2022   Lab Results  Component Value Date   MICRALBCREAT 6.2 07/22/2022   MICRALBCREAT 18.7 03/21/2020   Lab Results  Component Value Date   CREATININE 1.13 01/27/2023   Lab Results  Component Value Date   GFR 54.82 (L) 01/27/2023    ASSESSMENT / PLAN  1. Uncontrolled type 2 diabetes mellitus with hyperglycemia, without long-term current use of insulin (HCC)   2. Primary hypothyroidism     Diabetes Mellitus type 2, complicated by no known complications. - Diabetic status / severity: Uncontrolled.  Lab  Results  Component Value Date   HGBA1C 7.6 (H) 01/27/2023    - Hemoglobin A1c goal : <7% Discussed about compliance with diabetic medication.  Will restart her diabetes medicine as follows.  - Medications: See  below.  I) start Jardiance 10 mg daily. II) start Ozempic 0.25 mg weekly for 4 weeks and increase to 0.5 mg weekly.   - Home glucose testing: Check in the morning fasting daily. - Discussed/ Gave Hypoglycemia treatment plan.  # Consult : not required at this time.   # Annual urine for microalbuminuria/ creatinine ratio, microalbuminuria currently, microalbumin creatinine ratio was 93, checked at [facility] health in September 2024.  If she continues to have elevated microalbumin we will plan forACE/ARB in future visit. Last  Lab Results  Component Value Date   MICRALBCREAT 6.2 07/22/2022    # Foot check nightly.  # Annual dilated diabetic eye exams.   - Diet: Make healthy diabetic food choices - Life style / activity / exercise: Discussed.  2. Blood pressure  -  BP Readings from Last 1 Encounters:  02/03/23 120/80    - Control is in target.  - No change in current plans.  3. Lipid status / Hyperlipidemia - Last  Lab Results  Component Value Date   LDLCALC 163 (H) 07/22/2022   - Continue simvastatin 10 mg daily.  # Primary hypothyroidism -Continue current dose of levothyroxine 200 mcg daily.  She had recent TSH of 9.94 elevated however she is not fully compliant. -Discussed about proper intake and compliance. -Check thyroid function test in 2 months.  Diagnoses and all orders for this visit:  Uncontrolled type 2 diabetes mellitus with hyperglycemia, without long-term current use of insulin (HCC) -     empagliflozin (JARDIANCE) 10 MG TABS tablet; TAKE 1 TABLET DAILY. PLEASE SCHEDULE FOLLOW-UP  Primary hypothyroidism -     levothyroxine (SYNTHROID) 200 MCG tablet; Take 1 tablet (200 mcg total) by mouth daily. -     T4, free; Future -     Cancel: T3, free; Future -     Cancel: TSH; Future -     TSH; Future  Other orders -     Semaglutide,0.25 or 0.5MG /DOS, 2 MG/3ML SOPN; Inject 0.25 mg into the skin once a week for 4 weeks and increase to 0.5 mg weekly. -      simvastatin (ZOCOR) 10 MG tablet; Take 1 tablet (10 mg total) by mouth at bedtime.    DISPOSITION Follow up in clinic in 3  months suggested.   All questions answered and patient verbalized understanding of the plan.  Gail Tylen Leverich, MD [facility] Endocrinology [facility] [address] [place], [address] [place], [place] [address] Phone # [phone]  At least part of this note was generated using voice recognition software. Inadvertent word errors may have occurred, which were not recognized during the proofreading process.

## 2023-02-11 ENCOUNTER — Other Ambulatory Visit: Payer: Self-pay

## 2023-09-20 ENCOUNTER — Other Ambulatory Visit: Payer: Self-pay | Admitting: Endocrinology

## 2023-09-21 NOTE — Telephone Encounter (Signed)
 Refill request complete

## 2023-10-26 ENCOUNTER — Emergency Department (HOSPITAL_COMMUNITY): Payer: Self-pay

## 2023-10-26 ENCOUNTER — Encounter (HOSPITAL_COMMUNITY): Payer: Self-pay

## 2023-10-26 ENCOUNTER — Emergency Department (HOSPITAL_COMMUNITY)
Admission: EM | Admit: 2023-10-26 | Discharge: 2023-10-26 | Disposition: A | Discharge status: Home / Self Care | Payer: Self-pay

## 2023-10-26 ENCOUNTER — Other Ambulatory Visit: Payer: Self-pay

## 2023-10-26 DIAGNOSIS — S0990XA Unspecified injury of head, initial encounter: Secondary | ICD-10-CM | POA: Insufficient documentation

## 2023-10-26 DIAGNOSIS — W1830XA Fall on same level, unspecified, initial encounter: Secondary | ICD-10-CM | POA: Insufficient documentation

## 2023-10-26 MED ORDER — ACETAMINOPHEN 500 MG PO TABS
1000.0000 mg | ORAL_TABLET | Freq: Once | ORAL | Status: AC
  Administered 2023-10-26: 1000 mg via ORAL
  Filled 2023-10-26: qty 2

## 2023-10-26 MED ORDER — MECLIZINE HCL 12.5 MG PO TABS: 12.5000 mg | ORAL_TABLET | Freq: Three times a day (TID) | ORAL | 0 refills | Status: AC | PRN

## 2023-10-26 MED ORDER — MECLIZINE HCL 25 MG PO TABS
25.0000 mg | ORAL_TABLET | Freq: Once | ORAL | Status: AC
  Administered 2023-10-26: 25 mg via ORAL
  Filled 2023-10-26: qty 1

## 2023-10-26 NOTE — ED Triage Notes (Addendum)
 Pt givens verbal consent for mse; no other neuro deficits

## 2023-10-26 NOTE — Discharge Instructions (Addendum)
 Evaluate today was overall reassuring.  You do have a small bruise in your right scalp.  You can apply ice and use Tylenol  and ibuprofen as needed.  Please follow-up with your PCP.  If your symptoms worsen or change in any way please return to the ED for further evaluation.  It is possible he sustained a mild concussion.  Please treat supportively at home.

## 2023-10-26 NOTE — ED Triage Notes (Signed)
 Pt to er, pt states that she fall and hit her head, states that she has a hematoma on her head, states that she is also very dizzy at this time.  States that she tripped/fell over a cushion, unknown LOC. Denies being on a blood thinner, pt answering questions appropriately.

## 2023-10-26 NOTE — ED Provider Notes (Signed)
 [place] EMERGENCY DEPARTMENT AT [facility] Provider Note   CSN: [id number] Arrival date & time: 10/26/23  1241     Patient presents with: Fall  HPI Gail Barr is a 58 y.o. female presenting for head injury after a fall today.  Occurred earlier this afternoon.  Patient was attempting to sit down on a cushion when her 69-year-old granddaughter ran by her and startled her.  She fell down and hit the back of her head on the cabinet.  Denies LOC but did states she saw all black for maybe a second but then return to her normal state of mentation.  She also mention that she felt somewhat dizzy after the fall.  She characterizes this as a room spinning sensation and lightheadedness.  She states this symptom has improved.  He denies any chest pain, shortness of breath or palpitations.  She is ambulatory.  Dizziness is proved with sitting down.    Fall       Prior to Admission medications   Medication Sig Start Date End Date Taking? Authorizing Provider  meclizine  (ANTIVERT ) 12.5 MG tablet Take 1 tablet (12.5 mg total) by mouth 3 (three) times daily as needed for dizziness. 10/26/23  Yes Lang Norleen POUR, PA-C  Cholecalciferol (VITAMIN D3) 50 MCG (2000 UT) capsule Take 2,000 Units by mouth daily.    [provider]  empagliflozin  (JARDIANCE ) 10 MG TABS tablet TAKE 1 TABLET DAILY. PLEASE SCHEDULE FOLLOW-UP 02/03/23   Thapa, [place], MD  levothyroxine  (SYNTHROID ) 200 MCG tablet Take 1 tablet (200 mcg total) by mouth daily. 02/03/23   Thapa, [place], MD  Magnesium 500 MG TABS Take 1 tablet by mouth daily.    [provider]  Multiple Vitamins-Minerals (MULTIVITAL PO) Take by mouth as needed.     [provider]  Semaglutide ,0.25 or 0.5MG /DOS, (OZEMPIC , 0.25 OR 0.5 MG/DOSE,) 2 MG/3ML SOPN INJECT 0.5 MG INTO THE SKIN ONE TIME PER WEEK 09/19/22   [place], MD  Semaglutide ,0.25 or 0.5MG /DOS, (OZEMPIC , 0.25 OR 0.5 MG/DOSE,) 2 MG/3ML SOPN INJECT 0.25 MG INTO THE  SKIN ONCE A WEEK FOR 4 WEEKS AND INCREASE TO 0.5 MG WEEKLY. 09/21/23   Thapa, [place], MD  simvastatin  (ZOCOR ) 10 MG tablet Take 1 tablet (10 mg total) by mouth at bedtime. 02/03/23   Thapa, [place], MD    Allergies: Betadine [povidone iodine], Ibuprofen, Iodinated contrast media, Iodine, and Iohexol    Review of Systems See HPI  Updated Vital Signs BP (!) 140/76   Pulse 64   Temp 98 F (36.7 C)   Resp 16   SpO2 96%   Physical Exam Vitals and nursing note reviewed.  HENT:     Head: Normocephalic and atraumatic.      Mouth/Throat:     Mouth: Mucous membranes are moist.  Eyes:     General:        Right eye: No discharge.        Left eye: No discharge.     Conjunctiva/sclera: Conjunctivae normal.  Cardiovascular:     Rate and Rhythm: Normal rate and regular rhythm.     Pulses: Normal pulses.     Heart sounds: Normal heart sounds.  Pulmonary:     Effort: Pulmonary effort is normal.     Breath sounds: Normal breath sounds.  Abdominal:     General: Abdomen is flat.     Palpations: Abdomen is soft.  Skin:    General: Skin is warm and dry.  Neurological:  General: No focal deficit present.     Comments: GCS 15. Speech is goal oriented. No deficits appreciated to CN III-XII; symmetric eyebrow raise, no facial drooping, tongue midline. Patient has equal grip strength bilaterally with 5/5 strength against resistance in all major muscle groups bilaterally. Sensation to light touch intact. Patient moves extremities without ataxia. Normal finger-nose-finger. Patient ambulatory with steady gait.  Psychiatric:        Mood and Affect: Mood normal.     (all labs ordered are listed, but only abnormal results are displayed) Labs Reviewed - No data to display  EKG: None  Radiology: CT Head Wo Contrast Result Date: 10/26/2023 CLINICAL DATA:  Head trauma, minor, normal mental status (Age 73-64y) EXAM: CT HEAD WITHOUT CONTRAST TECHNIQUE: Contiguous axial images were obtained from the  base of the skull through the vertex without intravenous contrast. RADIATION DOSE REDUCTION: This exam was performed according to the departmental dose-optimization program which includes automated exposure control, adjustment of the mA and/or kV according to patient size and/or use of iterative reconstruction technique. COMPARISON:  May 18, 2017 FINDINGS: Brain: The ventricles appear age appropriate. No mass effect or midline shift. Gray-white differentiation is preserved without focal attenuation abnormality.No evidence of acute territorial infarction, extra-axial fluid collection, hemorrhage, or mass lesion. The basilar cisterns are patent without downward herniation. The cerebellar hemispheres and vermis are well formed without mass lesion or focal attenuation abnormality. Vascular: No hyperdense vessel. Skull: Small, high right parietal scalp hematoma. Negative for fracture or focal lesion. Sinuses/Orbits: The paranasal sinuses and mastoids are clear.The globes appear intact. No retrobulbar hematoma. Other: None. IMPRESSION: Small, high right parietal scalp hematoma. Otherwise, no acute intracranial abnormality, specifically, no acute hemorrhage, territorial infarction, or intracranial mass. Electronically Signed   By: Rogelia Myers M.D.   On: 10/26/2023 14:37     Procedures   Medications Ordered in the ED  meclizine  (ANTIVERT ) tablet 25 mg (has no administration in time range)  acetaminophen  (TYLENOL ) tablet 1,000 mg (has no administration in time range)                                    Medical Decision Making Amount and/or Complexity of Data Reviewed Radiology: ordered.   58 year old well-appearing female presenting for fall and head injury.  Exam was unremarkable but did show small hematoma to the right parietal scalp.  Neuroexam was normal.  DDx includes skull fracture, intracranial head bleed, central vertigo, other.  I personally reviewed and interpreted her CT scan which  revealed a small right parietal scalp hematoma but otherwise no acute abnormality.  It is possible that she sustained a mild concussion.  Advised appropriate treatment at home.  Discussed return precautions.  Also advised to follow-up with PCP.  Sent meclizine  to her pharmacy as needed for her dizziness.  Discharged good condition.     Final diagnoses:  Injury of head, initial encounter    ED Discharge Orders          Ordered    meclizine  (ANTIVERT ) 12.5 MG tablet  3 times daily PRN        10/26/23 1518               Lang Norleen POUR, PA-C 10/26/23 1522    Neysa Caron PARAS, DO 10/26/23 2338

## 2024-01-07 ENCOUNTER — Other Ambulatory Visit: Payer: Self-pay | Admitting: Family Medicine

## 2024-01-07 DIAGNOSIS — R921 Mammographic calcification found on diagnostic imaging of breast: Secondary | ICD-10-CM

## 2024-01-19 ENCOUNTER — Ambulatory Visit
Admission: RE | Admit: 2024-01-19 | Discharge: 2024-01-19 | Disposition: A | Payer: Self-pay | Source: Ambulatory Visit | Attending: Family Medicine | Admitting: Family Medicine

## 2024-01-19 ENCOUNTER — Other Ambulatory Visit: Payer: Self-pay | Admitting: Family Medicine

## 2024-01-19 DIAGNOSIS — R921 Mammographic calcification found on diagnostic imaging of breast: Secondary | ICD-10-CM

## 2024-01-25 ENCOUNTER — Ambulatory Visit
Admission: RE | Admit: 2024-01-25 | Discharge: 2024-01-25 | Disposition: A | Source: Ambulatory Visit | Attending: Family Medicine | Admitting: Family Medicine

## 2024-01-25 ENCOUNTER — Ambulatory Visit
Admission: RE | Admit: 2024-01-25 | Discharge: 2024-01-25 | Disposition: A | Discharge status: Home / Self Care | Source: Ambulatory Visit | Attending: Family Medicine | Admitting: Family Medicine

## 2024-01-25 DIAGNOSIS — R921 Mammographic calcification found on diagnostic imaging of breast: Secondary | ICD-10-CM

## 2024-01-25 HISTORY — PX: BREAST BIOPSY: SHX20

## 2024-01-26 LAB — SURGICAL PATHOLOGY
